# Patient Record
Sex: Female | Born: 1955 | Race: Black or African American | Hispanic: No | Marital: Married | Smoking: Never smoker
Health system: Southern US, Community
[De-identification: ages and names within clinical notes are randomized; demographics above are authoritative.]

## PROBLEM LIST (undated history)

## (undated) DIAGNOSIS — Z9289 Personal history of other medical treatment: Secondary | ICD-10-CM

## (undated) DIAGNOSIS — M549 Dorsalgia, unspecified: Secondary | ICD-10-CM

## (undated) DIAGNOSIS — D72819 Decreased white blood cell count, unspecified: Secondary | ICD-10-CM

## (undated) DIAGNOSIS — Z8619 Personal history of other infectious and parasitic diseases: Secondary | ICD-10-CM

## (undated) DIAGNOSIS — K589 Irritable bowel syndrome without diarrhea: Secondary | ICD-10-CM

## (undated) DIAGNOSIS — J301 Allergic rhinitis due to pollen: Secondary | ICD-10-CM

## (undated) DIAGNOSIS — D649 Anemia, unspecified: Secondary | ICD-10-CM

## (undated) DIAGNOSIS — I10 Essential (primary) hypertension: Secondary | ICD-10-CM

## (undated) DIAGNOSIS — D219 Benign neoplasm of connective and other soft tissue, unspecified: Secondary | ICD-10-CM

## (undated) DIAGNOSIS — Z9889 Other specified postprocedural states: Secondary | ICD-10-CM

## (undated) HISTORY — DX: Personal history of other medical treatment: Z92.89

## (undated) HISTORY — DX: Benign neoplasm of connective and other soft tissue, unspecified: D21.9

## (undated) HISTORY — DX: Allergic rhinitis due to pollen: J30.1

## (undated) HISTORY — DX: Decreased white blood cell count, unspecified: D72.819

## (undated) HISTORY — DX: Dorsalgia, unspecified: M54.9

## (undated) HISTORY — DX: Personal history of other infectious and parasitic diseases: Z86.19

## (undated) HISTORY — DX: Other specified postprocedural states: Z98.890

## (undated) HISTORY — PX: OTHER SURGICAL HISTORY: SHX169

## (undated) HISTORY — DX: Irritable bowel syndrome, unspecified: K58.9

## (undated) HISTORY — DX: Anemia, unspecified: D64.9

---

## 1975-01-15 HISTORY — PX: GALLBLADDER SURGERY: SHX652

## 1997-05-27 ENCOUNTER — Ambulatory Visit (HOSPITAL_COMMUNITY): Admission: RE | Admit: 1997-05-27 | Discharge: 1997-05-27 | Payer: Self-pay | Admitting: Obstetrics and Gynecology

## 1997-09-26 ENCOUNTER — Other Ambulatory Visit: Admission: RE | Admit: 1997-09-26 | Discharge: 1997-09-26 | Payer: Self-pay | Admitting: Obstetrics and Gynecology

## 1998-06-23 ENCOUNTER — Ambulatory Visit (HOSPITAL_COMMUNITY): Admission: RE | Admit: 1998-06-23 | Discharge: 1998-06-23 | Payer: Self-pay | Admitting: Obstetrics and Gynecology

## 1998-06-23 ENCOUNTER — Encounter: Payer: Self-pay | Admitting: Obstetrics and Gynecology

## 1998-11-15 ENCOUNTER — Other Ambulatory Visit: Admission: RE | Admit: 1998-11-15 | Discharge: 1998-11-15 | Payer: Self-pay | Admitting: Obstetrics and Gynecology

## 1999-03-07 ENCOUNTER — Other Ambulatory Visit: Admission: RE | Admit: 1999-03-07 | Discharge: 1999-03-07 | Payer: Self-pay | Admitting: Obstetrics and Gynecology

## 1999-05-10 ENCOUNTER — Encounter (INDEPENDENT_AMBULATORY_CARE_PROVIDER_SITE_OTHER): Payer: Self-pay

## 1999-05-10 ENCOUNTER — Other Ambulatory Visit: Admission: RE | Admit: 1999-05-10 | Discharge: 1999-05-10 | Payer: Self-pay | Admitting: Obstetrics and Gynecology

## 1999-08-24 ENCOUNTER — Encounter: Payer: Self-pay | Admitting: Obstetrics and Gynecology

## 1999-08-24 ENCOUNTER — Ambulatory Visit (HOSPITAL_COMMUNITY): Admission: RE | Admit: 1999-08-24 | Discharge: 1999-08-24 | Payer: Self-pay | Admitting: Obstetrics and Gynecology

## 1999-09-26 ENCOUNTER — Other Ambulatory Visit: Admission: RE | Admit: 1999-09-26 | Discharge: 1999-09-26 | Payer: Self-pay | Admitting: Obstetrics and Gynecology

## 1999-12-20 ENCOUNTER — Other Ambulatory Visit: Admission: RE | Admit: 1999-12-20 | Discharge: 1999-12-20 | Payer: Self-pay | Admitting: Obstetrics and Gynecology

## 2000-05-14 ENCOUNTER — Other Ambulatory Visit: Admission: RE | Admit: 2000-05-14 | Discharge: 2000-05-14 | Payer: Self-pay | Admitting: Obstetrics and Gynecology

## 2000-09-08 ENCOUNTER — Other Ambulatory Visit: Admission: RE | Admit: 2000-09-08 | Discharge: 2000-09-08 | Payer: Self-pay | Admitting: Obstetrics and Gynecology

## 2000-09-18 ENCOUNTER — Ambulatory Visit (HOSPITAL_COMMUNITY): Admission: RE | Admit: 2000-09-18 | Discharge: 2000-09-18 | Payer: Self-pay | Admitting: Obstetrics and Gynecology

## 2000-09-18 ENCOUNTER — Encounter: Payer: Self-pay | Admitting: Obstetrics and Gynecology

## 2000-11-28 ENCOUNTER — Encounter (INDEPENDENT_AMBULATORY_CARE_PROVIDER_SITE_OTHER): Payer: Self-pay | Admitting: Specialist

## 2000-11-28 ENCOUNTER — Ambulatory Visit (HOSPITAL_COMMUNITY): Admission: RE | Admit: 2000-11-28 | Discharge: 2000-11-28 | Payer: Self-pay | Admitting: Obstetrics and Gynecology

## 2001-01-14 HISTORY — PX: VESICOVAGINAL FISTULA CLOSURE W/ TAH: SUR271

## 2001-02-18 ENCOUNTER — Encounter (INDEPENDENT_AMBULATORY_CARE_PROVIDER_SITE_OTHER): Payer: Self-pay | Admitting: Specialist

## 2001-02-19 ENCOUNTER — Inpatient Hospital Stay (HOSPITAL_COMMUNITY): Admission: RE | Admit: 2001-02-19 | Discharge: 2001-02-21 | Payer: Self-pay | Admitting: Obstetrics and Gynecology

## 2001-05-05 ENCOUNTER — Emergency Department (HOSPITAL_COMMUNITY): Admission: EM | Admit: 2001-05-05 | Discharge: 2001-05-05 | Payer: Self-pay | Admitting: Emergency Medicine

## 2001-10-05 ENCOUNTER — Encounter: Payer: Self-pay | Admitting: Obstetrics and Gynecology

## 2001-10-05 ENCOUNTER — Ambulatory Visit (HOSPITAL_COMMUNITY): Admission: RE | Admit: 2001-10-05 | Discharge: 2001-10-05 | Payer: Self-pay | Admitting: Obstetrics and Gynecology

## 2001-11-10 ENCOUNTER — Other Ambulatory Visit: Admission: RE | Admit: 2001-11-10 | Discharge: 2001-11-10 | Payer: Self-pay | Admitting: Obstetrics and Gynecology

## 2002-10-07 ENCOUNTER — Encounter: Payer: Self-pay | Admitting: Obstetrics and Gynecology

## 2002-10-07 ENCOUNTER — Ambulatory Visit (HOSPITAL_COMMUNITY): Admission: RE | Admit: 2002-10-07 | Discharge: 2002-10-07 | Payer: Self-pay | Admitting: Obstetrics and Gynecology

## 2002-12-02 ENCOUNTER — Other Ambulatory Visit: Admission: RE | Admit: 2002-12-02 | Discharge: 2002-12-02 | Payer: Self-pay | Admitting: Obstetrics and Gynecology

## 2003-11-30 ENCOUNTER — Ambulatory Visit (HOSPITAL_COMMUNITY): Admission: RE | Admit: 2003-11-30 | Discharge: 2003-11-30 | Payer: Self-pay | Admitting: Obstetrics and Gynecology

## 2004-06-19 ENCOUNTER — Other Ambulatory Visit: Admission: RE | Admit: 2004-06-19 | Discharge: 2004-06-19 | Payer: Self-pay | Admitting: Obstetrics and Gynecology

## 2004-07-30 ENCOUNTER — Ambulatory Visit: Payer: Self-pay | Admitting: Internal Medicine

## 2004-09-21 ENCOUNTER — Ambulatory Visit: Payer: Self-pay | Admitting: Internal Medicine

## 2004-09-26 ENCOUNTER — Ambulatory Visit (HOSPITAL_COMMUNITY): Admission: RE | Admit: 2004-09-26 | Discharge: 2004-09-26 | Payer: Self-pay | Admitting: *Deleted

## 2004-09-26 ENCOUNTER — Encounter (INDEPENDENT_AMBULATORY_CARE_PROVIDER_SITE_OTHER): Payer: Self-pay | Admitting: *Deleted

## 2004-09-28 ENCOUNTER — Ambulatory Visit: Payer: Self-pay | Admitting: Internal Medicine

## 2004-10-29 ENCOUNTER — Ambulatory Visit: Payer: Self-pay | Admitting: Internal Medicine

## 2004-12-03 ENCOUNTER — Ambulatory Visit: Payer: Self-pay | Admitting: Internal Medicine

## 2004-12-05 ENCOUNTER — Ambulatory Visit: Payer: Self-pay | Admitting: Internal Medicine

## 2005-01-02 ENCOUNTER — Ambulatory Visit (HOSPITAL_COMMUNITY): Admission: RE | Admit: 2005-01-02 | Discharge: 2005-01-02 | Payer: Self-pay | Admitting: Obstetrics and Gynecology

## 2005-05-30 ENCOUNTER — Ambulatory Visit: Payer: Self-pay | Admitting: Internal Medicine

## 2005-06-07 ENCOUNTER — Ambulatory Visit: Payer: Self-pay | Admitting: Internal Medicine

## 2009-07-24 ENCOUNTER — Ambulatory Visit: Payer: Self-pay | Admitting: Internal Medicine

## 2009-07-24 DIAGNOSIS — M549 Dorsalgia, unspecified: Secondary | ICD-10-CM | POA: Insufficient documentation

## 2009-07-24 DIAGNOSIS — R7309 Other abnormal glucose: Secondary | ICD-10-CM

## 2009-08-11 ENCOUNTER — Encounter: Payer: Self-pay | Admitting: Internal Medicine

## 2009-11-06 ENCOUNTER — Ambulatory Visit: Payer: Self-pay | Admitting: Internal Medicine

## 2009-11-06 LAB — CONVERTED CEMR LAB
ALT: 18 units/L (ref 0–35)
AST: 20 units/L (ref 0–37)
Albumin: 3.7 g/dL (ref 3.5–5.2)
Alkaline Phosphatase: 80 units/L (ref 39–117)
BUN: 13 mg/dL (ref 6–23)
Basophils Absolute: 0 10*3/uL (ref 0.0–0.1)
Basophils Relative: 0.6 % (ref 0.0–3.0)
Bilirubin Urine: NEGATIVE
Bilirubin, Direct: 0.1 mg/dL (ref 0.0–0.3)
Blood in Urine, dipstick: NEGATIVE
CO2: 24 meq/L (ref 19–32)
Calcium: 9 mg/dL (ref 8.4–10.5)
Chloride: 105 meq/L (ref 96–112)
Cholesterol: 183 mg/dL (ref 0–200)
Creatinine, Ser: 0.6 mg/dL (ref 0.4–1.2)
Eosinophils Absolute: 0.1 10*3/uL (ref 0.0–0.7)
Eosinophils Relative: 2.5 % (ref 0.0–5.0)
GFR calc non Af Amer: 135.89 mL/min (ref >60)
Glucose, Bld: 100 mg/dL — ABNORMAL HIGH (ref 70–99)
Glucose, Urine, Semiquant: NEGATIVE
HCT: 39.2 % (ref 36.0–46.0)
HDL: 59.8 mg/dL (ref >39.00)
Hemoglobin: 13.4 g/dL (ref 12.0–15.0)
Hgb A1c MFr Bld: 5.7 % (ref 4.6–6.5)
Ketones, urine, test strip: NEGATIVE
LDL Cholesterol: 110 mg/dL — ABNORMAL HIGH (ref 0–99)
Lymphocytes Relative: 55.7 % — ABNORMAL HIGH (ref 12.0–46.0)
Lymphs Abs: 1.8 10*3/uL (ref 0.7–4.0)
MCHC: 34.1 g/dL (ref 30.0–36.0)
MCV: 90.8 fL (ref 78.0–100.0)
Monocytes Absolute: 0.4 10*3/uL (ref 0.1–1.0)
Monocytes Relative: 12 % (ref 3.0–12.0)
Neutro Abs: 1 10*3/uL — ABNORMAL LOW (ref 1.4–7.7)
Neutrophils Relative %: 29.2 % — ABNORMAL LOW (ref 43.0–77.0)
Nitrite: NEGATIVE
Platelets: 211 10*3/uL (ref 150.0–400.0)
Potassium: 4 meq/L (ref 3.5–5.1)
Protein, U semiquant: NEGATIVE
RBC: 4.32 M/uL (ref 3.87–5.11)
RDW: 13.2 % (ref 11.5–14.6)
Sodium: 138 meq/L (ref 135–145)
Specific Gravity, Urine: 1.02
TSH: 1.56 microintl units/mL (ref 0.35–5.50)
Total Bilirubin: 0.6 mg/dL (ref 0.3–1.2)
Total CHOL/HDL Ratio: 3
Total Protein: 6.7 g/dL (ref 6.0–8.3)
Triglycerides: 67 mg/dL (ref 0.0–149.0)
Urobilinogen, UA: 0.2
VLDL: 13.4 mg/dL (ref 0.0–40.0)
WBC Urine, dipstick: NEGATIVE
WBC: 3.3 10*3/uL — ABNORMAL LOW (ref 4.5–10.5)
pH: 6.5

## 2009-11-13 ENCOUNTER — Encounter: Payer: Self-pay | Admitting: Internal Medicine

## 2009-11-13 ENCOUNTER — Ambulatory Visit: Payer: Self-pay | Admitting: Internal Medicine

## 2009-11-13 DIAGNOSIS — D72819 Decreased white blood cell count, unspecified: Secondary | ICD-10-CM | POA: Insufficient documentation

## 2009-12-15 ENCOUNTER — Encounter: Payer: Self-pay | Admitting: Internal Medicine

## 2009-12-20 ENCOUNTER — Encounter: Payer: Self-pay | Admitting: Internal Medicine

## 2010-02-15 NOTE — Assessment & Plan Note (Signed)
Summary: cpx no pap//ccm   Vital Signs:  Patient profile:   55 year old female Menstrual status:  hysterectomy Height:      64.5 inches Weight:      157 pounds Pulse rate:   72 / minute BP sitting:   120 / 80  (left arm) Cuff size:   regular  Vitals Entered By: Romualdo Bolk, CMA (AAMA) (November 13, 2009 9:26 AM) CC: CPX- No pap- Pt has had a hyst.   History of Present Illness: Stacey Young comes in today  for preventive visit  Since last visit  here  there have been no major changes in health status  .  Back pain still an issue worse with weather . no fever  Therapy   massage  seems mechanical and  assoicated with grequent travel.  takes measures .  issues with knee since younger  exercises as can' Hasnt need to see gyne recently  had hyst for non cancer reasons  and no signs   Preventive Care Screening  Prior Values:    Mammogram:  normal (10/14/2008)    Colonoscopy:  normal (01/15/2004)   Preventive Screening-Counseling & Management  Alcohol-Tobacco     Alcohol drinks/day: 2     Alcohol type: wine     Smoking Status: never  Caffeine-Diet-Exercise     Caffeine use/day: less than 1 a day     Does Patient Exercise: yes     Type of exercise: gym, walking, zumba  Hep-HIV-STD-Contraception     Dental Visit-last 6 months no     Sun Exposure-Excessive: no  Safety-Violence-Falls     Seat Belt Use: yes     Firearms in the Home: no firearms in the home     Smoke Detectors: no     Fall Risk: no  Current Medications (verified): 1)  None  Allergies (verified): No Known Drug Allergies  Past History:  Past medical, surgical, family and social histories (including risk factors) reviewed, and no changes noted (except as noted below).  Past Medical History: Hay fever/allergies Uterine fibroids Anemia IBS Decreased WBC G2P2 Colonscopy UTD  Varicella as a child  back pain  Past Surgical History: Reviewed history from 07/24/2009 and no changes  required. Caesarean section-1987 Gallbladder-1977 Hysterectomy  2003  for fibroids and endometriosis   Past History:  Care Management: Gynecology: [facility]- In the Past  Contraindications/Deferment of Procedures/Staging:    Test/Procedure: PAP Smear    Reason for deferment: hysterectomy   Family History: Reviewed history from 07/24/2009 and no changes required. Father: Bladder Cancer, ? high cholesterol, both hips replace Mother: Back issues, ringing in ears, arthritis, low WBC, knee replaced Siblings: Sister- Low WBC, high cholesterol, migraines  Social History: Reviewed history from 07/24/2009 and no changes required. Never Smoked Alcohol use-yes Regular exercise-yes Drug use-no History of drug use HH of 2   Pet dog. 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  Dental Care w/in 6 mos.:  no Fall Risk:  no  Review of Systems       ocass knee give out since HS  . has shallow knee cap.   otherwise  system review 12 is negative or [place]   Physical Exam  General:  Well-developed,well-nourished,in no acute distress; alert,appropriate and cooperative throughout examination Head:  normocephalic, atraumatic, and no abnormalities observed.   Eyes:  PERRL, EOMs full, conjunctiva clear  Ears:  R ear normal, L ear normal, and no external deformities.   Nose:  no external deformity, no external erythema, and no nasal discharge.   Mouth:  good dentition and pharynx pink and moist.   Neck:  No deformities, masses, or tenderness noted. Breasts:  No mass, nodules, thickening, tenderness, bulging, retraction, inflamation, nipple discharge or skin changes noted.   Lungs:  Normal respiratory effort, chest expands symmetrically. Lungs are clear to auscultation, no crackles or wheezes.no dullness.   Heart:  Normal rate and regular rhythm. S1 and S2 normal without gallop, murmur, click, rub or other extra sounds. Abdomen:  Bowel sounds positive,abdomen soft and  non-tender without masses, organomegaly or   noted.  abd  scar well healed no hernia noted  Msk:  no joint swelling, no joint warmth, no redness over joints, and no joint deformities.   good flexibility Pulses:  pulses intact without delay   Extremities:  no clubbing cyanosis or edema  Neurologic:  alert & oriented X3, strength normal in all extremities, gait normal, and DTRs symmetrical and normal.   Pt is A&Ox3,affect,speech,memory,attention,&motor skills appear intact.  Skin:  turgor normal, color normal, no ecchymoses, no petechiae, and no purpura.   Cervical Nodes:  No lymphadenopathy noted Axillary Nodes:  No palpable lymphadenopathy Inguinal Nodes:  No significant adenopathy Psych:  Normal eye contact, appropriate affect. Cognition appears normal.  Labs reviewed   Impression & Recommendations:  Problem # 1:  PREVENTIVE HEALTH CARE (ICD-V70.0) Discussed nutrition,exercise,diet,healthy weight, vitamin D and calcium. no fam hx of osteoprosis  continue helathy lifestyle   pap not indicated today   .  Orders: EKG w/ Interpretation (93000)  Problem # 2:  BACK PAIN (ICD-724.5) mechanical   disc  prevention exercises etc.   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 get yearly check or as needed    Problem # 4:  HYPERGLYCEMIA (ICD-790.29) Assessment: Improved lifestyle intervention   hg a1c is 5.7   Other Orders: Tdap => 90yrs IM (91478) Admin 1st Vaccine (29562)  Patient Instructions: 1)  continue healthy lifestyle  2)  ensure adequate  calcium and vit d in your diet or supplement.  3)  yearly checks. 4)  quad and hamstring hip adductor exercises may  help knee and also back    Orders Added: 1)  Tdap => 1yrs IM [90715] 2)  Admin 1st Vaccine [90471] 3)  Est. Patient 40-64 years [99396] 4)  EKG w/ Interpretation [93000]   Immunizations Administered:  Tetanus Vaccine:    Vaccine Type: Tdap    Site: right  deltoid    Mfr: GlaxoSmithKline    Dose: 0.5 ml    Route: IM    Given by: Romualdo Bolk, CMA (AAMA)    Exp. Date: 11/03/2011    Lot #: [id number]   Immunizations Administered:  Tetanus Vaccine:    Vaccine Type: Tdap    Site: right deltoid    Mfr: GlaxoSmithKline    Dose: 0.5 ml    Route: IM    Given by: Romualdo Bolk, CMA (AAMA)    Exp. Date: 11/03/2011    Lot #: [id number]  Preventive Care Screening  Prior Values:    Mammogram:  normal (10/14/2008)    Colonoscopy:  normal (01/15/2004)

## 2010-02-15 NOTE — Letter (Signed)
Summary: External Other  External Other   Imported By: Everrett Coombe 08/11/2009 09:02:37  _____________________________________________________________________  External Attachment:    Type:   Image     Comment:   External Document

## 2010-02-15 NOTE — Assessment & Plan Note (Signed)
Summary: PT RE-EST // RS   Vital Signs:  Patient profile:   55 year old female Menstrual status:  hysterectomy Height:      64.5 inches Weight:      159 pounds BMI:     26.97 Pulse rate:   72 / minute BP sitting:   120 / 80  (right arm) Cuff size:   regular  Vitals Entered By: Romualdo Bolk, CMA (AAMA) (July 24, 2009 9:31 AM) CC: New Pt to get re-establish     Menstrual Status hysterectomy   History of Present Illness: Stacey Young comes in today  for new patient visit to get reestablished.  [place]  [place] from [place].   She has had a recent Back issues with spasm  .   THinks it is MS cause but no injury perse.  She has had elevated BG in the past . was tole her sugars  are   "pre diabetes"     x 2 and then better.   with lifestyle intervention  and continues to exercise .  Last labs   within a year.    in [place] where she previously lived.  On no meds  . lifestyle intervention for health prevention and glucose control     Preventive Care Screening  Mammogram:    Date:  10/14/2008    Results:  normal   Colonoscopy:    Date:  01/15/2004    Results:  normal    Preventive Screening-Counseling & Management  Alcohol-Tobacco     Alcohol drinks/day: 2     Alcohol type: wine     Smoking Status: never  Caffeine-Diet-Exercise     Caffeine use/day: less than 1 a day     Does Patient Exercise: yes     Type of exercise: gym, walking, zumba  Comments: husbands smoke   Hep-HIV-STD-Contraception     Sun Exposure-Excessive: no  Safety-Violence-Falls     Seat Belt Use: yes     Smoke Detectors: no      Drug Use:  no.        Blood Transfusions:  ? transfusion with hyst.?.    Current Medications (verified): 1)  None  Allergies (verified): No Known Drug Allergies  Past History:  Past Medical History: Hay fever/allergies Uterine fibroids Anemia IBS Decreased WBC G2P2 Colonscopy UTD  Varicella as a child   Past Surgical History: Caesarean  section-1987 Gallbladder-1977 Hysterectomy  2003  for fibroids and endometriosis   Past History:  Care Management: Gynecology: [facility]- In the Past  Family History: Father: Bladder Cancer, ? high cholesterol, both hips replace Mother: Back issues, ringing in ears, arthritis, low WBC, knee replaced Siblings: Sister- Low WBC, high cholesterol, migraines  Social History: Never Smoked Alcohol use-yes Regular exercise-yes Drug use-no History of drug use HH of 2   Pet dog. 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 transfusion with hyst.?  Review of Systems  The patient denies anorexia, fever, weight loss, weight gain, vision loss, hoarseness, chest pain, syncope, dyspnea on exertion, peripheral edema, prolonged cough, abdominal pain, melena, hematochezia, severe indigestion/heartburn, hematuria, difficulty walking, abnormal bleeding, enlarged lymph nodes, angioedema, and breast masses.    Physical Exam  General:  alert, well-developed, well-nourished, and well-hydrated.   Head:  normocephalic and atraumatic.  Eyes:  vision grossly intact, pupils equal, and pupils round.   Neck:  No deformities, masses, or tenderness noted. Lungs:  Normal respiratory effort, chest expands symmetrically. Lungs are clear to auscultation, no crackles or wheezes.no dullness.   Heart:  Normal rate and regular rhythm. S1 and S2 normal without gallop, murmur, click, rub or other extra sounds.no lifts.   Abdomen:  Bowel sounds positive,abdomen soft and non-tender without masses, organomegaly or   noted. Msk:  no joint swelling, no joint warmth, and no redness over joints.  no bony tendernes back  some paralumbar tenderness nl gait  Pulses:  pulses intact without delay   Extremities:  no clubbing cyanosis or edema    Neurologic:  alert & oriented X3 and gait normal.  grossly non focal  Skin:  turgor normal, color normal, no ecchymoses, and no petechiae.   Cervical Nodes:  No lymphadenopathy noted Psych:  Oriented X3, normally interactive, good eye contact, not anxious appearing, and not depressed appearing.     Impression & Recommendations:  Problem # 1:  HYPERGLYCEMIA (ICD-790.29) lifestyle intervention and needs follow up .     counseled .   no end organ signs .   "pre diabetes"   Problem # 2:  BACK PAIN (ICD-724.5) agree  MS  an no alarm features   . Expectant management   Patient Instructions: 1)  cpx labs  and Hg a1c  in   the fall with CPX  then .  2)  dx v70.0 and Hyperglycemia.  3)  call in meantime if needed.

## 2010-02-15 NOTE — Procedures (Signed)
Summary: Colonoscopy Report/[facility]   Colonoscopy Report/[facility]   Imported By: Maryln Gottron 12/27/2009 15:12:09  _____________________________________________________________________  External Attachment:    Type:   Image     Comment:   External Document

## 2010-02-15 NOTE — Miscellaneous (Signed)
Summary: Colonscopy  Clinical Lists Changes  Observations: Added new observation of COLONOSCOPY: Non bleeding, small, internal hemorrhoids The examined portion of the ileum was normal.  Done By Dr. Cipriano Mile: Normal. Location:  [facility].    (12/15/2009 13:21)        Colonoscopy  Procedure date:  12/15/2009  Findings:      Non bleeding, small, internal hemorrhoids The examined portion of the ileum was normal.  Done By Dr. Cipriano Mile: Normal. Location:  [facility].

## 2010-06-01 NOTE — Discharge Summary (Signed)
[facility]  Patient:    Stacey Young, Stacey Young Visit Number: [id number] MRN: [id number]          Service Type: GYN Location: 9300 [id number] Attending Physician:  Shaune Spittle Dictated by:   Gillermo Murdoch, P.A. Admit Date:  02/18/2001 Discharge Date: 02/21/2001                             Discharge Summary  DISCHARGE DIAGNOSES:          1. Fibroid uterus.                               2. Dysmenorrhea.                               3. CIN-1.                               4. Anemia.  PROCEDURES:                   On February 18, 2001: A vaginal hysterectomy was attempted, however, due to extensive adhesions, the patient underwent a total abdominal hysterectomy with extensive lysis of those adhesions tolerating the procedures well.  HISTORY OF PRESENT ILLNESS:   Stacey Young is a 55 year old married African-American female, para 2-0-0-2, with a long-standing history of symptomatic uterine fibroids and abnormal Pap smears who presents for definitive therapy of both in the form of hysterectomy. Please see patients dictated history and physical exam for details.  PHYSICAL EXAMINATION:  VITAL SIGNS:                  Blood pressure 90/60. Weight is 165. Height is 5 feet 4-1/2 inches tall.  GENERAL:                      Within normal limits.  PELVIC:                       EGBUS was within normal limits. Vagina is rugose. Cervix is nontender without lesions. Uterus appears 18 to 20 weeks size, irregular, without tenderness. Adnexa without tenderness or masses. Rectovaginal without tenderness or masses.  HOSPITAL COURSE:              On February 5, the patient underwent a total abdominal hysterectomy with extensive lysis of adhesions tolerating the procedures well. Within the pelvis was found a uterus of approximately 12 to 14 weeks size, ovaries which were adherent bilaterally, extensive pelvic adhesions and peritoneal endometriosis.  Postoperative course was marked by slow resumption of bowel function. However, by postoperative day #3, the patient was able to tolerate a regular diet and deemed ready for discharge home. Postoperative hemoglobin was 7.3 (preoperative hemoglobin 11.3).  DISCHARGE MEDICATIONS:        1. Tylox one to two tablets every four to six                                  hours as needed for pain.  2. Ibuprofen 600 mg one tablet with food every                                  six hours for five days then as needed for                                  pain.                               3. Iron 325 mg one tablet twice daily for six                                  weeks.                               4. Stool softeners 100 mg one tablet three times                                  daily until bowel movements are regular.                               5. Phenergan 12.5 mg one tablet four times daily                                  as needed for nausea.  DISCHARGE FOLLOWUP:           The patient is to see Dr. Dierdre Forth for her six weeks postoperative exam on April 13, 2001 at 3 p.m. at [facility].  POSTOPERATIVE INSTRUCTIONS:   The patient was given of [facility] postoperative instruction sheet. She was further advised to avoid driving for two weeks, heavy lifting for four weeks, and intercourse for six weeks.  FINAL PATHOLOGY:              Uterus: Endometrium: weakly proliferative, no hyperplasia or malignancy identified. Leiomyomatas submucosal, intramural and subserosal. Benign uterine serosa. No endometriosis or malignancy identified Cervix: Focal slight squamous dysplasia, CIN-1. No evidence of high grade dysplasia and margins not involved. Dictated by:   Gillermo Murdoch, P.A. Attending Physician:  Shaune Spittle DD:  03/18/01 TD:  03/20/01 Job: [id number]

## 2010-06-01 NOTE — Op Note (Signed)
[facility]  Patient:    Stacey Young, Stacey Young Visit Number: [id number] MRN: [id number]          Service Type: GYN Location: 9300 [id number] Attending Physician:  Shaune Spittle Dictated by:   Maris Berger Pennie Rushing, M.D. Proc. Date: 02/18/01 Admit Date:  02/18/2001                             Operative Report  PREOPERATIVE DIAGNOSES:       1. Uterine fibroids.                               2. Dysmenorrhea.                               3. Cervical intraepithelial neoplasia-one of the                                  cervix.  POSTOPERATIVE DIAGNOSES:      1. Uterine fibroids.                               2. Dysmenorrhea.                               3. Cervical intraepithelial neoplasia-one of the                                  cervix.                               4. Extensive endometriosis.  OPERATION:                    1. Attempted vaginal hysterectomy.                               2. Total abdominal hysterectomy.                               3. Extensive lysis of adhesions, secondary                                  endometriosis.                               4. Ureterolysis.  SURGEON:                      Vanessa P. Pennie Rushing, M.D.  FIRST ASSISTANT:              Henreitta Leber, P.A.-C. and Janine Limbo,                               M.D.  ANESTHESIA:  General orotracheal.  ESTIMATED BLOOD LOSS:         1100 cc.  FLUIDS:                       3200 cc of crystalloid.  URINE:                        360 cc.  FINDINGS:                     The uterus was enlarged to 12-14 weeks size with multiple uterine fibroids.  The ovaries were densely adherent to the posterior uterus as were the tubes.  The rectovaginal septum was obliterated with extensive adhesive disease.  DESCRIPTION OF PROCEDURE:     The patient was taken to the operating room after appropriate identification and placed on the operating table.   After the obtainment of adequate general anesthesia, she was placed in the lithotomy position.  The perineum and vagina were prepped with multiple layers of Betadine and draped as a sterile field.  A Foley catheter was inserted into the bladder and connected to straight drainage.  A weighted speculum was placed in the posterior vagina, and Lahey tenaculae placed on the anterior and posterior surfaces of the cervix.  The cervicovaginal mucosa was injected with a dilute solution of Pitressin and circumscribed.  The anterior vaginal mucosa was dissected off the anterior cervix.  However, the anterior peritoneum could not be entered.  The posterior vaginal mucosa was then incised.  However, no entry into the posterior cul-de-sac could be achieved, and it was apparent that the rectum was densely adherent to the posterior uterus.  At this time, a decision was made to proceed with abdominal hysterectomy.  All instruments were removed from the operative field and the patient placed in the supine position.  The abdomen was prepped with multiple layers of Betadine and draped in the sterile field.  A transverse incision was made at the site of a previous cesarean section, and the incision in the abdomen opened in layers.  The peritoneum was entered.  The above noted findings were noted.  A self-retaining OConnor-OSullivan retractor was placed and the bowel packed cephalad.  The right round ligament was identified, suture ligated, and incised, and that incision taken anteriorly on the anterior leaf of the broad ligament, and posteriorly towards the tube and utero-ovarian ligaments.  The fallopian tube was then clamped, cut, and suture ligated at the level of the uterine cornua, and the utero-ovarian ligament identified, clamped, cut, and suture ligated.  The retroperitoneal space was further entered and isolated, and with a combination of blunt and sharp dissection, the right ovary dissected off the  posterior uterus.  At that time, a right fundal fibroid was noted to be in the way of any further identification of anatomy, and a myomectomy was performed with a combination of blunt and sharp dissection to remove that right anterior fundal fibroid.  At that time, the ureter on the right side was isolated at the level of the infundibulopelvic ligament and followed down.  Further blunt and sharp dissection allowed isolation of the right uterine artery.  The peritoneum had been incised toward the bladder peritoneum, and the bladder was sharply dissected off the anterior cervix to meet the area that had been dissected during the vaginal hysterectomy.  At that time, the right uterine artery was skeletonized, and noted to be away from the right ureter which  had been identified and followed down.  It was then clamped, cut, and suture ligated with a suture of 0 Vicryl.  The left round ligament was then identified, isolated, clamped, and suture ligated, then incised, and the anterior peritoneum taken toward the bladder peritoneum to meet the other side.  The left ovary and sigmoid colon were densely adherent to the posterior uterus, and a combination of blunt and sharp dissection allowed resection of these structures off the posterior uterus. Again, the left fallopian tube was clamped, cut, and suture ligated at the level of the cornual region to allow access to the utero-ovarian ligament which was then clamped, cut, and suture ligated.  Further dissection on that side allowed isolation of the left uterine artery which was clamped, cut, and suture ligated.  At this point, the uterine fundus was excised from the cervix, and removed from the operative field.  A combination of blunt and sharp dissection allowed identification of the left ureter and the retroperitoneal space, and it was noted to be far away from the field of dissection on the left side as well. The paracervical tissues were then  clamped, cut, and suture ligated.  The  vagina was entered anteriorly at the site of dissection from the vaginal hysterectomy, and the cervix inverted to allow access from the vaginal mucosa to the vaginal angle which was clamped, cut, and suture ligated on either side with those sutures held.  The uterosacral ligaments were then accessed, clamped, cut, and suture ligated, and with further inversion of the cervix, the rectovaginal septum dissected off the posterior cervix to allow removal of the entire cervix.  Uterosacral ligament sutures were reinforced, and a Richardson stitch of the vaginal angle undertaken.  The remainder of the vaginal cuff was then closed with figure-of-eight sutures, all of 0 Vicryl, and hemostasis noted to be adequate.  The uterosacral ligament and vaginal angles on each side were tied together and then tied in the center.  Hemostasis was achieved in the right retroperitoneal space with clips and in the left adnexa with clips.  Copious irrigation was carried out and hemostasis noted to be adequate.  All instruments were then removed from the peritoneal cavity and the abdominal peritoneum closed with a running suture of 2-0 Vicryl.  The defects and the fascia were closed with interrupted sutures of 0 Vicryl, and then the fascial incision closed with a running suture of 0 Vicryl from each apex to the midline and then tied in the midline.  The subcutaneous tissue was copiously irrigated and made hemostatic with Bovie cautery and skin staples applied to the skin incision.  A sterile dressing was applied.  The patient was awakened from general anesthesia and taken to the recovery room in satisfactory condition having tolerated the procedure well.  The sponge and instrument counts correct.  Specimens to pathology - uterus and cervix.  Total operating time was 3 hours and 55 minutes. Dictated by:   Maris Berger. Pennie Rushing, M.D. Attending Physician:  Shaune Spittle DD:  02/18/01 TD:  02/19/01 Job: [id number]

## 2010-06-01 NOTE — H&P (Signed)
[facility]  Patient:    Stacey Young, Stacey Young Visit Number: [id number] MRN: [id number]          Service Type: DSU Location: [facility] Attending Physician:  Shaune Spittle Dictated by:   Henreitta Leber, P.A. Admit Date:  11/28/2000 Discharge Date: 11/28/2000                           History and Physical  DATE OF BIRTH:                02/05/1955.  HISTORY OF PRESENT ILLNESS:   Stacey Young is a 55 year old, married, African-American female, para 2-0-0-2, who presents with a longstanding history of symptomatic uterine fibroids and abnormal Pap smears, who presents for definitive treatment of both.  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  For the past three years, the patient has had problems with anemia due to menometrorrhagia and severe dysmenorrhea, both of which initially responded well to Loestrin 1.5/30.  More recently, however, in spite of continuing this regimen, the patient continues to have a menstrual flow which lasts longer than usual and is accompanied by clotting.  The patient underwent an endometrial biopsy in October 2002, which was within normal limits.  After review of her options, to include expected management, the patient has decided to undergo hysterectomy as a definitive treatment of the aforementioned concerns.  PAST MEDICAL AND OBSTETRICAL HISTORY:          Gravida 2, para 2-0-0-2; patient underwent one cesarean section.  GYNECOLOGICAL HISTORY:        Menarche at 55 years old; periods were regular up until three years ago (see history of present illness).  The patient uses Loestrin 1.5/30 for contraception; she has a  history of Trichomonas, HSV-II, and HPV; history of ovarian cysts, uterine fibroids, abnormal Pap smears (see history of present illness); the patient had a normal Pap smear in August 2002 and a normal mammogram September 2002.  MEDICAL HISTORY:              Positive for anemia and irritable bowel syndrome.  SURGICAL HISTORY:             S/P cholecystectomy and appendectomy 1977, dental surgery, cesarean section 1987, cervical cryosurgery 2001, and cold knife conization November 2002.  Denies blood transfusions or difficulties with anesthesia.  FAMILY HISTORY:               Positive for cancer, hypertension, stroke, diabetes, and hypercholesterolemia.  SOCIAL HISTORY:               The patient is married and she is employed with the [employer] for [employer]  HABITS:                       The patient does not use tobacco and she rarely consumes alcohol.  CURRENT MEDICATIONS:          1.  Loestrin 1.5/30.                               2.  Chondroitin/glucosamine.  3.  Tums.                               4.  Aleve p.r.n.  ALLERGIES:                    None.  REVIEW OF SYSTEMS:            The patient has occasional lightheadedness.  She wears glasses, has history of chronic sinusitis and chronic knee/right hip pain.  PHYSICAL EXAMINATION:  VITAL SIGNS:                  Blood pressure 90/60, weight 165, height 5 feet 4-1/2 inches tall.  EAR/NOSE/THROAT:              Within normal limits.  NECK:                         Supple, without masses.  There is no thyromegaly.  LUNGS:                        Clear to auscultation and percussion.  There are no heaves, rales, or rhonchi.  HEART:                        Regular rate and rhythm.  There is no murmur.  ABDOMEN:                      Bowel sounds are present.  It is soft and nontender.  BACK:                         Without CVA tenderness.  EXTREMITIES:                  No clubbing,  cyanosis, or edema.  PELVIC EXAM:                  EG/BUS within normal limits.  Vagina is rugose. Cervix is nontender, without lesions.  Uterus is 18-20 weeks size, irregular, without tenderness.  Adnexa without tenderness or masses.  RECTOVAGINAL:                 Without tenderness or masses.  IMPRESSIONS:                  1.  Symptomatic uterine fibroids.                               2.  Irregular bleeding.                               3.  CIN-I, with positive endocervical margins.  DISPOSITION:                  After considering medical and surgical options for presenting complaints, the patient has consented to undergo hysterectomy. She understands the implications for her procedure, along with the potential risks to include, but are not limited to, reaction to anesthesia, damage to adjacent organs, infection, and bleeding.  Patient is scheduled to undergo hysterectomy at [facility] on February 18, 2001, at 7:30 a.m. Dictated by:   Henreitta Leber, P.A. Attending Physician:  Shaune Spittle DD:  01/29/01 TD:  01/29/01 Job: [id number]

## 2010-06-01 NOTE — Op Note (Signed)
[facility]  Patient:    Stacey Young, Stacey Young Visit Number: [id number] MRN: [id number]          Service Type: DSU Location: [facility] Attending Physician:  Shaune Spittle Dictated by:   Maris Berger. Pennie Rushing, M.D. Proc. Date: 11/28/00 Admit Date:  11/28/2000                             Operative Report  PREOPERATIVE DIAGNOSIS:       Recurrent low grade squamous intraepithelial lesion with a positive endocervical curetting.  POSTOPERATIVE DIAGNOSIS:      Recurrent low grade squamous intraepithelial lesion with a positive endocervical curetting.  OPERATIONS:                   1. Cold knife conization of the cervix.                               2. Endocervical curetting.  SURGEON:                      Vanessa P. Pennie Rushing, M.D.  ANESTHESIA:                   General LMA.  ESTIMATED BLOOD LOSS:         Less than 50 cc.  COMPLICATIONS:                None.  FINDINGS:                     There were no exocervical lesions noted at the time of colposcopy.  DESCRIPTION OF PROCEDURE:     The patient was taken to the operating room and placed on the operating table.  After the attainment of adequate general anesthesia, she was placed in the lithotomy position.  The perineum and vagina were prepped with multiple layers of Betadine and draped as a sterile field. A weighted speculum was placed in the vagina and a Deaver used to elevate the bladder.  A single-tooth tenaculum was placed on the anterior cervix outside the transition zone.  The cervix was injected with a dilute solution of Pitressin and stay sutures placed at the 3 and 9 oclock positions and tied down.  A cone-shaped portion of cervix including the endocervix but not any significant portion of the exocervix was then excised and the endocervical canal which remained curetted sharply.  The conization specimen was marked with a suture at 12 oclock and sent fresh.  Sturm-Dorf sutures were placed  at the 6 and 12 oclock positions and hemostasis noted to be adequate.  A piece of Gelfoam was placed in the conization bed.  All instruments were removed from the vagina and the patient awakened from general anesthesia and taken to the recovery room in satisfactory condition, having tolerated the procedure well, with sponge and instrument counts correct. Dictated by:   Maris Berger. Pennie Rushing, M.D. Attending Physician:  Shaune Spittle DD:  11/28/00 TD:  11/28/00 Job: [id number]

## 2010-09-21 ENCOUNTER — Ambulatory Visit (INDEPENDENT_AMBULATORY_CARE_PROVIDER_SITE_OTHER): Payer: 59 | Admitting: Internal Medicine

## 2010-09-21 ENCOUNTER — Encounter: Payer: Self-pay | Admitting: Internal Medicine

## 2010-09-21 DIAGNOSIS — W57XXXA Bitten or stung by nonvenomous insect and other nonvenomous arthropods, initial encounter: Secondary | ICD-10-CM

## 2010-09-21 DIAGNOSIS — M79672 Pain in left foot: Secondary | ICD-10-CM

## 2010-09-21 DIAGNOSIS — M79609 Pain in unspecified limb: Secondary | ICD-10-CM

## 2010-09-21 MED ORDER — DOXYCYCLINE HYCLATE 100 MG PO CAPS: 100.0000 mg | ORAL_CAPSULE | Freq: Two times a day (BID) | ORAL | Status: DC

## 2010-09-21 NOTE — Progress Notes (Signed)
  Subjective:    Patient ID: Stacey Young, female    DOB: 1955-02-18, 55 y.o.   MRN: [id number]  HPI The patient comes in for acute issues and advice before travel. To go to [place] for 6 weeks working. Had multiple immunizations recently  No other se.  last week was outside and had a number of bug bites( flying)  And had itchy spot left neck felt to be a bug bite  However everything got better but redness  Became larger  Over the week and keeps itching .  No rx  no fever systemic sx other wise .  No tick bites.  Also has left foot pain off and on for a while but increase over past 2 weeks without specific injury. Does reg walking and no change shoes and no swelling or bruising.  Hurts on top of foot whe pushes off and walks.  Not severe but want opinion before travel.  sReview of Systems NO fever cp swollen joints  Bleeding bruising    Objective:   Physical Exam WDWn in [place]  Neck left neck  With a 2-3 cm round central punctate lesion  With flat redness and itchy no bullseye and no abscess or streaking  Left foot : nl by inspection localized tenderness left lateral foot below ankle  Pain worse with active plantar flexion not with passive  And no redness or swelling.  Pulses are normal. Neurovascular intact.     Assessment & Plan:  Insect bite and large reaction  1 week out .   'disc risk sx and can start antibioitc if needed but proably is a  Bug bite reaction. rx for doxy incase needed. Counseled. About use  Foot pain   Left  Acts like overuse  May sugg x ray or sports med if not better .   acitve rom hurts and not passive. Counseled.   None of this seems related to her immuniz for travel.

## 2010-09-21 NOTE — Patient Instructions (Signed)
Still looks like a large local reaction to insect bite.  Try cool compresses  And hydrocortisone  Topical.  If get more swollen and painful infected looking add antibiotic . In regard to foot pain is in area of foot tendinitis or overuse strain However   if  persistent or progressive can see sports med or ortho in the future.

## 2010-09-22 ENCOUNTER — Encounter: Payer: Self-pay | Admitting: Internal Medicine

## 2010-09-22 DIAGNOSIS — M79672 Pain in left foot: Secondary | ICD-10-CM | POA: Insufficient documentation

## 2010-11-26 ENCOUNTER — Telehealth: Payer: Self-pay | Admitting: Internal Medicine

## 2010-11-26 NOTE — Telephone Encounter (Signed)
Pt requested you contact her regarding a cpx. Will be avilable after 4:00

## 2010-11-26 NOTE — Telephone Encounter (Signed)
Left message to call back  

## 2010-11-27 NOTE — Telephone Encounter (Signed)
Pt needs to be worked in for a cpx before then end of the year. Where can we work her in at?

## 2010-11-28 NOTE — Telephone Encounter (Signed)
Per Dr. Fabian Sharp- can do 12/6 at 10:45 and 11am for her and her husband.

## 2010-11-28 NOTE — Telephone Encounter (Signed)
appts made.

## 2010-11-29 ENCOUNTER — Other Ambulatory Visit (INDEPENDENT_AMBULATORY_CARE_PROVIDER_SITE_OTHER): Payer: 59

## 2010-11-29 DIAGNOSIS — Z0000 Encounter for general adult medical examination without abnormal findings: Secondary | ICD-10-CM

## 2010-11-29 LAB — CBC WITH DIFFERENTIAL/PLATELET
Basophils Absolute: 0 10*3/uL (ref 0.0–0.1)
Basophils Relative: 0.3 % (ref 0.0–3.0)
Eosinophils Absolute: 0.1 10*3/uL (ref 0.0–0.7)
HCT: 38.8 % (ref 36.0–46.0)
Hemoglobin: 13.2 g/dL (ref 12.0–15.0)
Lymphs Abs: 2 10*3/uL (ref 0.7–4.0)
MCHC: 34.1 g/dL (ref 30.0–36.0)
Neutro Abs: 1.1 10*3/uL — ABNORMAL LOW (ref 1.4–7.7)
RBC: 4.32 Mil/uL (ref 3.87–5.11)
RDW: 13.5 % (ref 11.5–14.6)

## 2010-11-29 LAB — POCT URINALYSIS DIPSTICK
Glucose, UA: NEGATIVE
Leukocytes, UA: NEGATIVE
Protein, UA: NEGATIVE
Spec Grav, UA: 1.025
Urobilinogen, UA: 0.2

## 2010-11-30 LAB — BASIC METABOLIC PANEL
CO2: 27 mEq/L (ref 19–32)
Chloride: 108 mEq/L (ref 96–112)
Glucose, Bld: 95 mg/dL (ref 70–99)
Potassium: 3.8 mEq/L (ref 3.5–5.1)
Sodium: 141 mEq/L (ref 135–145)

## 2010-11-30 LAB — HEPATIC FUNCTION PANEL
ALT: 23 U/L (ref 0–35)
AST: 24 U/L (ref 0–37)
Albumin: 3.9 g/dL (ref 3.5–5.2)
Alkaline Phosphatase: 77 U/L (ref 39–117)
Total Bilirubin: 0.6 mg/dL (ref 0.3–1.2)

## 2010-11-30 LAB — LIPID PANEL
Cholesterol: 182 mg/dL (ref 0–200)
LDL Cholesterol: 103 mg/dL — ABNORMAL HIGH (ref 0–99)

## 2010-11-30 LAB — TSH: TSH: 1.64 u[IU]/mL (ref 0.35–5.50)

## 2010-12-03 ENCOUNTER — Encounter: Payer: Self-pay | Admitting: Internal Medicine

## 2010-12-03 ENCOUNTER — Ambulatory Visit (INDEPENDENT_AMBULATORY_CARE_PROVIDER_SITE_OTHER): Payer: 59 | Admitting: Internal Medicine

## 2010-12-03 VITALS — BP 110/70 | HR 60 | Ht 64.0 in | Wt 159.0 lb

## 2010-12-03 DIAGNOSIS — Z23 Encounter for immunization: Secondary | ICD-10-CM

## 2010-12-03 DIAGNOSIS — D72819 Decreased white blood cell count, unspecified: Secondary | ICD-10-CM

## 2010-12-03 DIAGNOSIS — G479 Sleep disorder, unspecified: Secondary | ICD-10-CM | POA: Insufficient documentation

## 2010-12-03 DIAGNOSIS — M25569 Pain in unspecified knee: Secondary | ICD-10-CM

## 2010-12-03 DIAGNOSIS — Z0000 Encounter for general adult medical examination without abnormal findings: Secondary | ICD-10-CM

## 2010-12-03 MED ORDER — ZOLPIDEM TARTRATE 10 MG PO TABS: 10.0000 mg | ORAL_TABLET | Freq: Every evening | ORAL | Status: DC | PRN

## 2010-12-03 NOTE — Patient Instructions (Signed)
Continue lifestyle intervention healthy eating and exercise .  Recheck one year or prn.

## 2010-12-03 NOTE — Progress Notes (Signed)
Subjective:    Patient ID: Stacey Young, female    DOB: 07/18/1955, 55 y.o.   MRN: [id number]  HPI Patient comes in today for Preventive Health Care visit  No major change in health status since last visit .  Sleep: an issues  When travels. Most flight now are over night to [place] and [place]. Can be hard. to sleep. Ask about sleep aid  otcs give too much hangover.  No chronic sleep problem otherwise  No ed visit major injury . Knee still problematic but exercises when can  Review of Systems ROS:  GEN/ HEENTNo fever, significant weight changes sweats headaches vision problems hearing changes, CV/ PULM; No chest pain shortness of breath cough, syncope,edema  change in exercise tolerance. GI /GU: No adominal pain, vomiting, change in bowel habits. No blood in the stool. No significant GU symptoms. SKIN/HEME: ,no acute skin rashes suspicious lesions or bleeding. No lymphadenopathy, nodules, masses.  NEURO/ PSYCH:  No neurologic signs such as weakness numbness No depression anxiety. IMM/ Allergy: No unusual infections.  Allergy .   REST of 12 system review negative  Knees bother her at times as above. Right  Has hx of evaluation and exercise s not done recently  Medial quad tenderness at times  Past Medical History  Diagnosis Date  . Hay fever   . Fibroids     uterine  . Anemia   . IBS (irritable bowel syndrome)   . WBC decreased   . Hx of colonoscopy     utd  . Hx of varicella     as a child  . Back pain     History   Social History  . Marital Status: Single    Spouse Name: N/A    Number of Children: N/A  . Years of Education: N/A   Occupational History  . Not on file.   Social History Main Topics  . Smoking status: Never Smoker   . Smokeless tobacco: Not on file  . Alcohol Use: Yes  . Drug Use: No  . Sexually Active: Not on file   Other Topics Concern  . Not on file   Social History Narrative   Never SmokedAlcohol use-yesRegular Exercise-yesDrug use-  nohh of 2 Pet dogMarried education PhDExecutive education cclTravels a lot    Past Surgical History  Procedure Date  . Bladder repair w/ cesarean section 1987  . Gallbladder surgery 1977  . Vesicovaginal fistula closure w/ tah 2003    for fibroids and endometriosis    Family History  Problem Relation Age of Onset  . Arthritis Mother   . Cancer Father     bladder  . Hyperlipidemia Father   . Migraines Sister   . Hyperlipidemia Sister     low wbc    No Known Allergies  No current outpatient prescriptions on file prior to visit.    BP 110/70  Pulse 60  Ht 5\' 4"  (1.626 m)  Wt 159 lb (72.122 kg)  BMI 27.29 kg/m2       Objective:   Physical Exam Physical Exam: Vital signs reviewed NWG:NFAO is a well-developed well-nourished alert cooperative  aa female who appears her stated age in no acute distress.  HEENT: normocephalic  traumatic , Eyes: PERRL EOM's full, conjunctiva clear, Nares: paten,t no deformity discharge or tenderness., Ears: no deformity EAC's clear TMs with normal landmarks. Mouth: clear OP, no lesions, edema.  Moist mucous membranes. Dentition in adequate repair. NECK: supple without masses, thyromegaly or bruits. CHEST/PULM:  Clear to auscultation and percussion breath sounds equal no wheeze , rales or rhonchi. No chest wall deformities or tenderness. CV: PMI is nondisplaced, S1 S2 no gallops, murmurs, rubs. Peripheral pulses are full without delay.No JVD .  Breast: normal by inspection . No dimpling, discharge, masses, tenderness or discharge .  ABDOMEN: Bowel sounds normal nontender  No guard or rebound, no hepato splenomegal no CVA tenderness.  No hernia. Extremtities:  No clubbing cyanosis or edema, no acute joint swelling or redness no focal atrophy good muscle mass  Nl rom  NEURO:  Oriented x3, cranial nerves 3-12 appear to be intact, no obvious focal weakness,gait within normal limits no abnormal reflexes or asymmetrical SKIN: No acute rashes normal  turgor, color, no bruising or petechiae. PSYCH: Oriented, good eye contact, no obvious depression anxiety, cognition and judgment appear normal. Pelvic: NL ext GU, labia clear without lesions or rash . Vagina no lesions .Cervix: absent  UTERUS: absent CMT  No mass Adnexa:  clear no masses . Rectal confirmed no masses  Lab Results  Component Value Date   WBC 3.7* 11/29/2010   HGB 13.2 11/29/2010   HCT 38.8 11/29/2010   PLT 226.0 11/29/2010   GLUCOSE 95 11/29/2010   CHOL 182 11/29/2010   TRIG 44.0 11/29/2010   HDL 70.60 11/29/2010   LDLCALC 103* 11/29/2010   ALT 23 11/29/2010   AST 24 11/29/2010   NA 141 11/29/2010   K 3.8 11/29/2010   CL 108 11/29/2010   CREATININE 0.7 11/29/2010   BUN 17 11/29/2010   CO2 27 11/29/2010   TSH 1.64 11/29/2010   HGBA1C 5.7 11/06/2009           Assessment & Plan:  Preventive Health Care Continue lifestyle intervention healthy eating and exercise . Flu shot today  Up to date  on healthcare parameters per hx   disc mammo gram form signed Knee issues  Seems like Patellar tracking   Disc exercise  Muscle strengthing  Sleep episodic issues with trans oceanic travel and jet lag.     Risk benefit of medication discussed. Ambien rx call for more refills if needed  Leukopenia stable  Get yearly check

## 2010-12-03 NOTE — Assessment & Plan Note (Signed)
Runs in family and has been stable

## 2010-12-04 DIAGNOSIS — Z23 Encounter for immunization: Secondary | ICD-10-CM

## 2010-12-10 ENCOUNTER — Other Ambulatory Visit: Payer: Self-pay | Admitting: Internal Medicine

## 2010-12-10 DIAGNOSIS — Z1231 Encounter for screening mammogram for malignant neoplasm of breast: Secondary | ICD-10-CM

## 2011-01-03 ENCOUNTER — Ambulatory Visit (HOSPITAL_COMMUNITY)
Admission: RE | Admit: 2011-01-03 | Discharge: 2011-01-03 | Disposition: A | Discharge status: Home / Self Care | Payer: 59 | Source: Ambulatory Visit | Attending: Internal Medicine | Admitting: Internal Medicine

## 2011-01-03 DIAGNOSIS — Z1231 Encounter for screening mammogram for malignant neoplasm of breast: Secondary | ICD-10-CM | POA: Insufficient documentation

## 2011-04-29 ENCOUNTER — Telehealth: Payer: Self-pay | Admitting: *Deleted

## 2011-04-29 NOTE — Telephone Encounter (Signed)
Pls advise.  

## 2011-04-29 NOTE — Telephone Encounter (Signed)
Cant make a judgement based on info given  Need more information  Please document what kind of ear drop this is .What did she do after discovery of mistake s? did she irrigate her eye ? why was she using eye drops?  May need also to call pharmacist about what was in the drops.  Does she wear contacts ?  If pain or change in vision  Needs to seek medical care   .

## 2011-04-29 NOTE — Telephone Encounter (Signed)
Patient is calling because she accidentally put ear drops in her eyes.  She states that she" is fine and that there eye feels fine and her eye feel the same as when she has allergies".  She would like to know if she should come in for an office visit?

## 2011-04-30 NOTE — Telephone Encounter (Signed)
Pt states the drops were ear wax removal drops.  Pt states as soon as she realized she had put drops in her eyes she began to flush her eyes for several minutes. Pt denies any vision changes. Pt did state that her eyes were burning some.   Pt states the drops were not in her eyes for more than 30 seconds.  Pt states her eyes did not feel different but it was hard to tell due to having problems with allergies.  Today pt states her eyes feel ok.  Pt does not wear contacts.  Pt is aware of Dr. Rosezella [place] recommendations and states she understands.  Pt is going to [place] until Thursday and states she will call the office back if she has concerns.

## 2011-11-15 ENCOUNTER — Encounter: Payer: Self-pay | Admitting: Internal Medicine

## 2011-12-03 ENCOUNTER — Other Ambulatory Visit (INDEPENDENT_AMBULATORY_CARE_PROVIDER_SITE_OTHER): Payer: 59

## 2011-12-03 DIAGNOSIS — Z0000 Encounter for general adult medical examination without abnormal findings: Secondary | ICD-10-CM

## 2011-12-03 LAB — CBC WITH DIFFERENTIAL/PLATELET
Basophils Relative: 0.6 % (ref 0.0–3.0)
Eosinophils Absolute: 0.1 10*3/uL (ref 0.0–0.7)
Eosinophils Relative: 1.5 % (ref 0.0–5.0)
Hemoglobin: 13.5 g/dL (ref 12.0–15.0)
Lymphocytes Relative: 51.8 % — ABNORMAL HIGH (ref 12.0–46.0)
MCHC: 33.8 g/dL (ref 30.0–36.0)
Monocytes Relative: 11.7 % (ref 3.0–12.0)
Neutro Abs: 1.2 10*3/uL — ABNORMAL LOW (ref 1.4–7.7)
Neutrophils Relative %: 34.4 % — ABNORMAL LOW (ref 43.0–77.0)
RBC: 4.49 Mil/uL (ref 3.87–5.11)
WBC: 3.6 10*3/uL — ABNORMAL LOW (ref 4.5–10.5)

## 2011-12-03 LAB — BASIC METABOLIC PANEL
CO2: 28 mEq/L (ref 19–32)
Calcium: 9.1 mg/dL (ref 8.4–10.5)
Creatinine, Ser: 0.8 mg/dL (ref 0.4–1.2)
GFR: 90.97 mL/min (ref >60.00)
Sodium: 138 mEq/L (ref 135–145)

## 2011-12-03 LAB — POCT URINALYSIS DIPSTICK
Blood, UA: NEGATIVE
Ketones, UA: NEGATIVE
Protein, UA: NEGATIVE
Spec Grav, UA: 1.015
Urobilinogen, UA: 0.2

## 2011-12-03 LAB — LIPID PANEL
Cholesterol: 190 mg/dL (ref 0–200)
LDL Cholesterol: 116 mg/dL — ABNORMAL HIGH (ref 0–99)
Total CHOL/HDL Ratio: 3
VLDL: 10.4 mg/dL (ref 0.0–40.0)

## 2011-12-03 LAB — HEPATIC FUNCTION PANEL
ALT: 19 U/L (ref 0–35)
AST: 20 U/L (ref 0–37)
Albumin: 3.9 g/dL (ref 3.5–5.2)
Alkaline Phosphatase: 69 U/L (ref 39–117)
Bilirubin, Direct: 0.1 mg/dL (ref 0.0–0.3)
Total Protein: 7.1 g/dL (ref 6.0–8.3)

## 2011-12-10 ENCOUNTER — Encounter: Payer: Self-pay | Admitting: Internal Medicine

## 2011-12-10 ENCOUNTER — Ambulatory Visit (INDEPENDENT_AMBULATORY_CARE_PROVIDER_SITE_OTHER): Payer: 59 | Admitting: Internal Medicine

## 2011-12-10 VITALS — BP 130/80 | HR 58 | Temp 98.0°F | Ht 64.25 in | Wt 159.0 lb

## 2011-12-10 DIAGNOSIS — R7309 Other abnormal glucose: Secondary | ICD-10-CM

## 2011-12-10 DIAGNOSIS — Z0000 Encounter for general adult medical examination without abnormal findings: Secondary | ICD-10-CM

## 2011-12-10 NOTE — Patient Instructions (Signed)
Continue lifestyle intervention healthy eating and exercise . consider shoulder exercises if needed.  Wellness in a year  Can ask for hg a1c at next blood test another diabetes risk check.   Preventive Care for Adults, Female A healthy lifestyle and preventive care can promote health and wellness. Preventive health guidelines for women include the following key practices.  A routine yearly physical is a good way to check with your caregiver about your health and preventive screening. It is a chance to share any concerns and updates on your health, and to receive a thorough exam.  Visit your dentist for a routine exam and preventive care every 6 months. Brush your teeth twice a day and floss once a day. Good oral hygiene prevents tooth decay and gum disease.  The frequency of eye exams is based on your age, health, family medical history, use of contact lenses, and other factors. Follow your caregiver's recommendations for frequency of eye exams.  Eat a healthy diet. Foods like vegetables, fruits, whole grains, low-fat dairy products, and lean protein foods contain the nutrients you need without too many calories. Decrease your intake of foods high in solid fats, added sugars, and salt. Eat the right amount of calories for you.Get information about a proper diet from your caregiver, if necessary.  Regular physical exercise is one of the most important things you can do for your health. Most adults should get at least 150 minutes of moderate-intensity exercise (any activity that increases your heart rate and causes you to sweat) each week. In addition, most adults need muscle-strengthening exercises on 2 or more days a week.  Maintain a healthy weight. The body mass index (BMI) is a screening tool to identify possible weight problems. It provides an estimate of body fat based on height and weight. Your caregiver can help determine your BMI, and can help you achieve or maintain a healthy weight.For  adults 20 years and older:  A BMI below 18.5 is considered underweight.  A BMI of 18.5 to 24.9 is normal.  A BMI of 25 to 29.9 is considered overweight.  A BMI of 30 and above is considered obese.  Maintain normal blood lipids and cholesterol levels by exercising and minimizing your intake of saturated fat. Eat a balanced diet with plenty of fruit and vegetables. Blood tests for lipids and cholesterol should begin at age 19 and be repeated every 5 years. If your lipid or cholesterol levels are high, you are over 50, or you are at high risk for heart disease, you may need your cholesterol levels checked more frequently.Ongoing high lipid and cholesterol levels should be treated with medicines if diet and exercise are not effective.  If you smoke, find out from your caregiver how to quit. If you do not use tobacco, do not start.  If you are pregnant, do not drink alcohol. If you are breastfeeding, be very cautious about drinking alcohol. If you are not pregnant and choose to drink alcohol, do not exceed 1 drink per day. One drink is considered to be 12 ounces (355 mL) of beer, 5 ounces (148 mL) of wine, or 1.5 ounces (44 mL) of liquor.  Avoid use of street drugs. Do not share needles with anyone. Ask for help if you need support or instructions about stopping the use of drugs.  High blood pressure causes heart disease and increases the risk of stroke. Your blood pressure should be checked at least every 1 to 2 years. Ongoing high blood pressure should  be treated with medicines if weight loss and exercise are not effective.  If you are 24 to 57 years old, ask your caregiver if you should take aspirin to prevent strokes.  Diabetes screening involves taking a blood sample to check your fasting blood sugar level. This should be done once every 3 years, after age 50, if you are within normal weight and without risk factors for diabetes. Testing should be considered at a younger age or be carried out  more frequently if you are overweight and have at least 1 risk factor for diabetes.  Breast cancer screening is essential preventive care for women. You should practice "breast self-awareness." This means understanding the normal appearance and feel of your breasts and may include breast self-examination. Any changes detected, no matter how small, should be reported to a caregiver. Women in their 67s and 30s should have a clinical breast exam (CBE) by a caregiver as part of a regular health exam every 1 to 3 years. After age 11, women should have a CBE every year. Starting at age 62, women should consider having a mammography (breast X-ray test) every year. Women who have a family history of breast cancer should talk to their caregiver about genetic screening. Women at a high risk of breast cancer should talk to their caregivers about having magnetic resonance imaging (MRI) and a mammography every year.  The Pap test is a screening test for cervical cancer. A Pap test can show cell changes on the cervix that might become cervical cancer if left untreated. A Pap test is a procedure in which cells are obtained and examined from the lower end of the uterus (cervix).  Women should have a Pap test starting at age 26.  Between ages 45 and 37, Pap tests should be repeated every 2 years.  Beginning at age 54, you should have a Pap test every 3 years as long as the past 3 Pap tests have been normal.  Some women have medical problems that increase the chance of getting cervical cancer. Talk to your caregiver about these problems. It is especially important to talk to your caregiver if a new problem develops soon after your last Pap test. In these cases, your caregiver may recommend more frequent screening and Pap tests.  The above recommendations are the same for women who have or have not gotten the vaccine for human papillomavirus (HPV).  If you had a hysterectomy for a problem that was not cancer or a  condition that could lead to cancer, then you no longer need Pap tests. Even if you no longer need a Pap test, a regular exam is a good idea to make sure no other problems are starting.  If you are between ages 33 and 18, and you have had normal Pap tests going back 10 years, you no longer need Pap tests. Even if you no longer need a Pap test, a regular exam is a good idea to make sure no other problems are starting.  If you have had past treatment for cervical cancer or a condition that could lead to cancer, you need Pap tests and screening for cancer for at least 20 years after your treatment.  If Pap tests have been discontinued, risk factors (such as a new sexual partner) need to be reassessed to determine if screening should be resumed.  The HPV test is an additional test that may be used for cervical cancer screening. The HPV test looks for the virus that can cause  the cell changes on the cervix. The cells collected during the Pap test can be tested for HPV. The HPV test could be used to screen women aged 28 years and older, and should be used in women of any age who have unclear Pap test results. After the age of 34, women should have HPV testing at the same frequency as a Pap test.  Colorectal cancer can be detected and often prevented. Most routine colorectal cancer screening begins at the age of 11 and continues through age 41. However, your caregiver may recommend screening at an earlier age if you have risk factors for colon cancer. On a yearly basis, your caregiver may provide home test kits to check for hidden blood in the stool. Use of a small camera at the end of a tube, to directly examine the colon (sigmoidoscopy or colonoscopy), can detect the earliest forms of colorectal cancer. Talk to your caregiver about this at age 70, when routine screening begins. Direct examination of the colon should be repeated every 5 to 10 years through age 16, unless early forms of pre-cancerous polyps or  small growths are found.  Hepatitis C blood testing is recommended for all people born from [birth year] through 1965 and any individual with known risks for hepatitis C.  Practice safe sex. Use condoms and avoid high-risk sexual practices to reduce the spread of sexually transmitted infections (STIs). STIs include gonorrhea, chlamydia, syphilis, trichomonas, herpes, HPV, and human immunodeficiency virus (HIV). Herpes, HIV, and HPV are viral illnesses that have no cure. They can result in disability, cancer, and death. Sexually active women aged 56 and younger should be checked for chlamydia. Older women with new or multiple partners should also be tested for chlamydia. Testing for other STIs is recommended if you are sexually active and at increased risk.  Osteoporosis is a disease in which the bones lose minerals and strength with aging. This can result in serious bone fractures. The risk of osteoporosis can be identified using a bone density scan. Women ages 72 and over and women at risk for fractures or osteoporosis should discuss screening with their caregivers. Ask your caregiver whether you should take a calcium supplement or vitamin D to reduce the rate of osteoporosis.  Menopause can be associated with physical symptoms and risks. Hormone replacement therapy is available to decrease symptoms and risks. You should talk to your caregiver about whether hormone replacement therapy is right for you.  Use sunscreen with sun protection factor (SPF) of 30 or more. Apply sunscreen liberally and repeatedly throughout the day. You should seek shade when your shadow is shorter than you. Protect yourself by wearing long sleeves, pants, a wide-brimmed hat, and sunglasses year round, whenever you are outdoors.  Once a month, do a whole body skin exam, using a mirror to look at the skin on your back. Notify your caregiver of new moles, moles that have irregular borders, moles that are larger than a pencil eraser, or  moles that have changed in shape or color.  Stay current with required immunizations.  Influenza. You need a dose every fall (or winter). The composition of the flu vaccine changes each year, so being vaccinated once is not enough.  Pneumococcal polysaccharide. You need 1 to 2 doses if you smoke cigarettes or if you have certain chronic medical conditions. You need 1 dose at age 8 (or older) if you have never been vaccinated.  Tetanus, diphtheria, pertussis (Tdap, Td). Get 1 dose of Tdap vaccine if you are  younger than age 25, are over 81 and have contact with an infant, are a Research scientist (physical sciences), are pregnant, or simply want to be protected from whooping cough. After that, you need a Td booster dose every 10 years. Consult your caregiver if you have not had at least 3 tetanus and diphtheria-containing shots sometime in your life or have a deep or dirty wound.  HPV. You need this vaccine if you are a woman age 68 or younger. The vaccine is given in 3 doses over 6 months.  Measles, mumps, rubella (MMR). You need at least 1 dose of MMR if you were born in 1957 or later. You may also need a second dose.  Meningococcal. If you are age 71 to 18 and a first-year college student living in a residence hall, or have one of several medical conditions, you need to get vaccinated against meningococcal disease. You may also need additional booster doses.  Zoster (shingles). If you are age 40 or older, you should get this vaccine.  Varicella (chickenpox). If you have never had chickenpox or you were vaccinated but received only 1 dose, talk to your caregiver to find out if you need this vaccine.  Hepatitis A. You need this vaccine if you have a specific risk factor for hepatitis A virus infection or you simply wish to be protected from this disease. The vaccine is usually given as 2 doses, 6 to 18 months apart.  Hepatitis B. You need this vaccine if you have a specific risk factor for hepatitis B virus  infection or you simply wish to be protected from this disease. The vaccine is given in 3 doses, usually over 6 months. Preventive Services / Frequency Ages 64 to 13  Blood pressure check.** / Every 1 to 2 years.  Lipid and cholesterol check.** / Every 5 years beginning at age 41.  Clinical breast exam.** / Every year after age 21.  Mammogram.** / Every year beginning at age 59 and continuing for as long as you are in good health. Consult with your caregiver.  Pap test.** / Every 3 years starting at age 68 through age 49 or 51 with a history of 3 consecutive normal Pap tests.  HPV screening.** / Every 3 years from ages 31 through ages 7 to 11 with a history of 3 consecutive normal Pap tests.  Fecal occult blood test (FOBT) of stool. / Every year beginning at age 41 and continuing until age 39. You may not need to do this test if you get a colonoscopy every 10 years.  Flexible sigmoidoscopy or colonoscopy.** / Every 5 years for a flexible sigmoidoscopy or every 10 years for a colonoscopy beginning at age 45 and continuing until age 19.  Hepatitis C blood test.** / For all people born from [birth year] through 1965 and any individual with known risks for hepatitis C.  Skin self-exam. / Monthly.  Influenza immunization.** / Every year.  Pneumococcal polysaccharide immunization.** / 1 to 2 doses if you smoke cigarettes or if you have certain chronic medical conditions.  Tetanus, diphtheria, pertussis (Tdap, Td) immunization.** / A one-time dose of Tdap vaccine. After that, you need a Td booster dose every 10 years.  Measles, mumps, rubella (MMR) immunization. / You need at least 1 dose of MMR if you were born in 1957 or later. You may also need a second dose.  Varicella immunization.** / Consult your caregiver.  Meningococcal immunization.** / Consult your caregiver.  Hepatitis A immunization.** / Consult your caregiver. 2 doses,  6 to 18 months apart.  Hepatitis B immunization.** / Consult  your caregiver. 3 doses, usually over 6 months. ** Family history and personal history of risk and conditions may change your caregiver's recommendations. Document Released: 02/26/2001 Document Revised: 03/25/2011 Document Reviewed: 05/28/2010 [facility] Patient Information 2013 [place], [place].   Shoulder Exercises EXERCISES  RANGE OF MOTION (ROM) AND STRETCHING EXERCISES These exercises may help you when beginning to rehabilitate your injury. Your symptoms may resolve with or without further involvement from your physician, physical therapist or athletic trainer. While completing these exercises, remember:   Restoring tissue flexibility helps normal motion to return to the joints. This allows healthier, less painful movement and activity.  An effective stretch should be held for at least 30 seconds.  A stretch should never be painful. You should only feel a gentle lengthening or release in the stretched tissue. ROM - Pendulum  Bend at the waist so that your right / left arm falls away from your body. Support yourself with your opposite hand on a solid surface, such as a table or a countertop.  Your right / left arm should be perpendicular to the ground. If it is not perpendicular, you need to lean over farther. Relax the muscles in your right / left arm and shoulder as much as possible.  Gently sway your hips and trunk so they move your right / left arm without any use of your right / left shoulder muscles.  Progress your movements so that your right / left arm moves side to side, then forward and backward, and finally, both clockwise and counterclockwise.  Complete __________ repetitions in each direction. Many people use this exercise to relieve discomfort in their shoulder as well as to gain range of motion. Repeat __________ times. Complete this exercise __________ times per day. STRETCH  Flexion, Standing  Stand with good posture. With an underhand grip on your right / left hand  and an overhand grip on the opposite hand, grasp a broomstick or cane so that your hands are a little more than shoulder-width apart.  Keeping your right / left elbow straight and shoulder muscles relaxed, push the stick with your opposite hand to raise your right / left arm in front of your body and then overhead. Raise your arm until you feel a stretch in your right / left shoulder, but before you have increased shoulder pain.  Try to avoid shrugging your right / left shoulder as your arm rises by keeping your shoulder blade tucked down and toward your mid-back spine. Hold __________ seconds.  Slowly return to the starting position. Repeat __________ times. Complete this exercise __________ times per day. STRETCH - Internal Rotation  Place your right / left hand behind your back, palm-up.  Throw a towel or belt over your opposite shoulder. Grasp the towel/belt with your right / left hand.  While keeping an upright posture, gently pull up on the towel/belt until you feel a stretch in the front of your right / left shoulder.  Avoid shrugging your right / left shoulder as your arm rises by keeping your shoulder blade tucked down and toward your mid-back spine.  Hold __________. Release the stretch by lowering your opposite hand. Repeat __________ times. Complete this exercise __________ times per day. STRETCH - External Rotation and Abduction  Stagger your stance through a doorframe. It does not matter which foot is forward.  As instructed by your physician, physical therapist or athletic trainer, place your hands:  And forearms above your  head and on the door frame.  And forearms at head-height and on the door frame.  At elbow-height and on the door frame.  Keeping your head and chest upright and your stomach muscles tight to prevent over-extending your low-back, slowly shift your weight onto your front foot until you feel a stretch across your chest and/or in the front of your  shoulders.  Hold __________ seconds. Shift your weight to your back foot to release the stretch. Repeat __________ times. Complete this stretch __________ times per day.  STRENGTHENING EXERCISES  These exercises may help you when beginning to rehabilitate your injury. They may resolve your symptoms with or without further involvement from your physician, physical therapist or athletic trainer. While completing these exercises, remember:   Muscles can gain both the endurance and the strength needed for everyday activities through controlled exercises.  Complete these exercises as instructed by your physician, physical therapist or athletic trainer. Progress the resistance and repetitions only as guided.  You may experience muscle soreness or fatigue, but the pain or discomfort you are trying to eliminate should never worsen during these exercises. If this pain does worsen, stop and make certain you are following the directions exactly. If the pain is still present after adjustments, discontinue the exercise until you can discuss the trouble with your clinician.  If advised by your physician, during your recovery, avoid activity or exercises which involve actions that place your right / left hand or elbow above your head or behind your back or head. These positions stress the tissues which are trying to heal. STRENGTH - Scapular Depression and Adduction  With good posture, sit on a firm chair. Supported your arms in front of you with pillows, arm rests or a table top. Have your elbows in line with the sides of your body.  Gently draw your shoulder blades down and toward your mid-back spine. Gradually increase the tension without tensing the muscles along the top of your shoulders and the back of your neck.  Hold for __________ seconds. Slowly release the tension and relax your muscles completely before completing the next repetition.  After you have practiced this exercise, remove the arm support  and complete it in standing as well as sitting. Repeat __________ times. Complete this exercise __________ times per day.  STRENGTH - External Rotators  Secure a rubber exercise band/tubing to a fixed object so that it is at the same height as your right / left elbow when you are standing or sitting on a firm surface.  Stand or sit so that the secured exercise band/tubing is at your side that is not injured.  Bend your elbow 90 degrees. Place a folded towel or small pillow under your right / left arm so that your elbow is a few inches away from your side.  Keeping the tension on the exercise band/tubing, pull it away from your body, as if pivoting on your elbow. Be sure to keep your body steady so that the movement is only coming from your shoulder rotating.  Hold __________ seconds. Release the tension in a controlled manner as you return to the starting position. Repeat __________ times. Complete this exercise __________ times per day.  STRENGTH - Supraspinatus  Stand or sit with good posture. Grasp a __________ weight or an exercise band/tubing so that your hand is "thumbs-up," like when you shake hands.  Slowly lift your right / left hand from your thigh into the air, traveling about 30 degrees from straight out at  your side. Lift your hand to shoulder height or as far as you can without increasing any shoulder pain. Initially, many people do not lift their hands above shoulder height.  Avoid shrugging your right / left shoulder as your arm rises by keeping your shoulder blade tucked down and toward your mid-back spine.  Hold for __________ seconds. Control the descent of your hand as you slowly return to your starting position. Repeat __________ times. Complete this exercise __________ times per day.  STRENGTH - Shoulder Extensors  Secure a rubber exercise band/tubing so that it is at the height of your shoulders when you are either standing or sitting on a firm arm-less chair.  With  a thumbs-up grip, grasp an end of the band/tubing in each hand. Straighten your elbows and lift your hands straight in front of you at shoulder height. Step back away from the secured end of band/tubing until it becomes tense.  Squeezing your shoulder blades together, pull your hands down to the sides of your thighs. Do not allow your hands to go behind you.  Hold for __________ seconds. Slowly ease the tension on the band/tubing as you reverse the directions and return to the starting position. Repeat __________ times. Complete this exercise __________ times per day.  STRENGTH - Scapular Retractors  Secure a rubber exercise band/tubing so that it is at the height of your shoulders when you are either standing or sitting on a firm arm-less chair.  With a palm-down grip, grasp an end of the band/tubing in each hand. Straighten your elbows and lift your hands straight in front of you at shoulder height. Step back away from the secured end of band/tubing until it becomes tense.  Squeezing your shoulder blades together, draw your elbows back as you bend them. Keep your upper arm lifted away from your body throughout the exercise.  Hold __________ seconds. Slowly ease the tension on the band/tubing as you reverse the directions and return to the starting position. Repeat __________ times. Complete this exercise __________ times per day. STRENGTH  Scapular Depressors  Find a sturdy chair without wheels, such as a from a dining room table.  Keeping your feet on the floor, lift your bottom from the seat and lock your elbows.  Keeping your elbows straight, allow gravity to pull your body weight down. Your shoulders will rise toward your ears.  Raise your body against gravity by drawing your shoulder blades down your back, shortening the distance between your shoulders and ears. Although your feet should always maintain contact with the floor, your feet should progressively support less body weight as  you get stronger.  Hold __________ seconds. In a controlled and slow manner, lower your body weight to begin the next repetition. Repeat __________ times. Complete this exercise __________ times per day.  Document Released: 11/14/2004 Document Revised: 03/25/2011 Document Reviewed: 04/14/2008 [facility] Patient Information 2013 [place], [place].

## 2011-12-10 NOTE — Progress Notes (Signed)
Chief Complaint  Patient presents with  . Annual Exam    Has form    HPI: Patient comes in today for Preventive Health Care visit  No major change in health status since last visit . No major injuries hospitalization or emergency room visits. She is a couple questions she's noticed that her memory to retrieve given names has decreased since menopausal symptoms. There is a family history of Alzheimer's and the non-first-degree relatives who are older than 80 no acute findings. She thinks that she sleeps well tries to exercise. Had laboratory studies done due for mammogram this year. Try seeing a healthy diet Mediterranean type.  ROS:  GEN/ HEENT: No fever, significant weight changes sweats headaches vision problems hearing changes, CV/ PULM; No chest pain shortness of breath cough, syncope,edema  change in exercise tolerance. GI /GU: No adominal pain, vomiting, change in bowel habits. No blood in the stool. No significant GU symptoms. SKIN/HEME: ,no acute skin rashes suspicious lesions or bleeding. No lymphadenopathy, nodules, masses.  NEURO/ PSYCH:  No neurologic signs such as weakness numbness. No depression anxiety. IMM/ Allergy: No unusual infections.  Allergy .   REST of 12 system review negative except as per HPI Shoulder issue when sleeps on this   No acute injury   Past Medical History  Diagnosis Date  . Hay fever   . Fibroids     uterine  . Anemia   . IBS (irritable bowel syndrome)   . WBC decreased   . Hx of colonoscopy     utd  . Hx of varicella     as a child  . Back pain     Family History  Problem Relation Age of Onset  . Arthritis Mother   . Cancer Father     bladder  . Hyperlipidemia Father   . Migraines Sister   . Hyperlipidemia Sister     low wbc  . Dementia      older aunt uncle gm 74s and 33s    History   Social History  . Marital Status: Single    Spouse Name: N/A    Number of Children: N/A  . Years of Education: N/A   Social History Main  Topics  . Smoking status: Never Smoker   . Smokeless tobacco: None  . Alcohol Use: Yes  . Drug Use: No  . Sexually Active: None   Other Topics Concern  . None   Social History Narrative   Never SmokedAlcohol use-yesRegular Exercise-yesDrug use- nohh of 2 Pet dogMarried education PhDExecutive education cclTravels a lot internationally 2 weeks out of each month aboutChildbirth Hx    No outpatient encounter prescriptions on file as of 12/10/2011.    Mom had forgetfullness  And  Uncle and other.  In 80s  EXAM:  BP 130/80  Pulse 58  Temp 98 F (36.7 C) (Oral)  Ht 5' 4.25" (1.632 m)  Wt 159 lb (72.122 kg)  BMI 27.08 kg/m2  SpO2 98%  Body mass index is 27.08 kg/(m^2).  Physical Exam: Vital signs reviewed [id number] is a well-developed well-nourished alert cooperative   female who appears her stated age in no acute distress.  HEENT: normocephalic atraumatic , Eyes: PERRL EOM's full, conjunctiva clear, Nares: paten,t no deformity discharge or tenderness., Ears: no deformity EAC's clear TMs with normal landmarks. Mouth: clear OP, no lesions, edema.  Moist mucous membranes. Dentition in adequate repair. NECK: supple without masses, thyromegaly or bruits. CHEST/PULM:  Clear to auscultation and percussion breath sounds equal no  wheeze , rales or rhonchi. No chest wall deformities or tenderness. Breast: normal by inspection . No dimpling, discharge, masses, tenderness or discharge . CV: PMI is nondisplaced, S1 S2 no gallops, murmurs, rubs. Peripheral pulses are full without delay.No JVD .  ABDOMEN: Bowel sounds normal nontender  No guard or rebound, no hepato splenomegal no CVA tenderness.  No hernia. Extremtities:  No clubbing cyanosis or edema, no acute joint swelling or redness no focal atrophy shoulder good rom points to anterior shoudler as area of wehre has problems at times  NEURO:  Oriented x3, cranial nerves 3-12 appear to be intact, no obvious focal weakness,gait within normal  limits no abnormal reflexes or asymmetrical SKIN: No acute rashes normal turgor, color, no bruising or petechiae. PSYCH: Oriented, good eye contact, no obvious depression anxiety, cognition and judgment appear normal. LN: no cervical axillary inguinal adenopathy  Lab Results  Component Value Date   WBC 3.6* 12/03/2011   HGB 13.5 12/03/2011   HCT 40.0 12/03/2011   PLT 215.0 12/03/2011   GLUCOSE 101* 12/03/2011   CHOL 190 12/03/2011   TRIG 52.0 12/03/2011   HDL 63.40 12/03/2011   LDLCALC 116* 12/03/2011   ALT 19 12/03/2011   AST 20 12/03/2011   NA 138 12/03/2011   K 4.2 12/03/2011   CL 104 12/03/2011   CREATININE 0.8 12/03/2011   BUN 20 12/03/2011   CO2 28 12/03/2011   TSH 1.18 12/03/2011   HGBA1C 5.7 11/06/2009    ASSESSMENT AND PLAN:  Discussed the following assessment and plan:  1. Visit for preventive health examination   2. HYPERGLYCEMIA    borderline fastin just above 100   Preventive Health Care Counseled regarding healthy nutrition, exercise, sleep, injury prevention, calcium vit d and healthy weight . Form completed . Disc memory and brain health  .  bg borderline  Can check a1c at next blood tests  Patient Instructions  Continue lifestyle intervention healthy eating and exercise . consider shoulder exercises if needed.  Wellness in a year  Can ask for hg a1c at next blood test another diabetes risk check.   Preventive Care for Adults, Female A healthy lifestyle and preventive care can promote health and wellness. Preventive health guidelines for women include the following key practices.  A routine yearly physical is a good way to check with your caregiver about your health and preventive screening. It is a chance to share any concerns and updates on your health, and to receive a thorough exam.  Visit your dentist for a routine exam and preventive care every 6 months. Brush your teeth twice a day and floss once a day. Good oral hygiene prevents tooth decay  and gum disease.  The frequency of eye exams is based on your age, health, family medical history, use of contact lenses, and other factors. Follow your caregiver's recommendations for frequency of eye exams.  Eat a healthy diet. Foods like vegetables, fruits, whole grains, low-fat dairy products, and lean protein foods contain the nutrients you need without too many calories. Decrease your intake of foods high in solid fats, added sugars, and salt. Eat the right amount of calories for you.Get information about a proper diet from your caregiver, if necessary.  Regular physical exercise is one of the most important things you can do for your health. Most adults should get at least 150 minutes of moderate-intensity exercise (any activity that increases your heart rate and causes you to sweat) each week. In addition, most adults need muscle-strengthening exercises  on 2 or more days a week.  Maintain a healthy weight. The body mass index (BMI) is a screening tool to identify possible weight problems. It provides an estimate of body fat based on height and weight. Your caregiver can help determine your BMI, and can help you achieve or maintain a healthy weight.For adults 20 years and older:  A BMI below 18.5 is considered underweight.  A BMI of 18.5 to 24.9 is normal.  A BMI of 25 to 29.9 is considered overweight.  A BMI of 30 and above is considered obese.  Maintain normal blood lipids and cholesterol levels by exercising and minimizing your intake of saturated fat. Eat a balanced diet with plenty of fruit and vegetables. Blood tests for lipids and cholesterol should begin at age 35 and be repeated every 5 years. If your lipid or cholesterol levels are high, you are over 50, or you are at high risk for heart disease, you may need your cholesterol levels checked more frequently.Ongoing high lipid and cholesterol levels should be treated with medicines if diet and exercise are not effective.  If you  smoke, find out from your caregiver how to quit. If you do not use tobacco, do not start.  If you are pregnant, do not drink alcohol. If you are breastfeeding, be very cautious about drinking alcohol. If you are not pregnant and choose to drink alcohol, do not exceed 1 drink per day. One drink is considered to be 12 ounces (355 mL) of beer, 5 ounces (148 mL) of wine, or 1.5 ounces (44 mL) of liquor.  Avoid use of street drugs. Do not share needles with anyone. Ask for help if you need support or instructions about stopping the use of drugs.  High blood pressure causes heart disease and increases the risk of stroke. Your blood pressure should be checked at least every 1 to 2 years. Ongoing high blood pressure should be treated with medicines if weight loss and exercise are not effective.  If you are 53 to 57 years old, ask your caregiver if you should take aspirin to prevent strokes.  Diabetes screening involves taking a blood sample to check your fasting blood sugar level. This should be done once every 3 years, after age 53, if you are within normal weight and without risk factors for diabetes. Testing should be considered at a younger age or be carried out more frequently if you are overweight and have at least 1 risk factor for diabetes.  Breast cancer screening is essential preventive care for women. You should practice "breast self-awareness." This means understanding the normal appearance and feel of your breasts and may include breast self-examination. Any changes detected, no matter how small, should be reported to a caregiver. Women in their 19s and 30s should have a clinical breast exam (CBE) by a caregiver as part of a regular health exam every 1 to 3 years. After age 25, women should have a CBE every year. Starting at age 25, women should consider having a mammography (breast X-ray test) every year. Women who have a family history of breast cancer should talk to their caregiver about genetic  screening. Women at a high risk of breast cancer should talk to their caregivers about having magnetic resonance imaging (MRI) and a mammography every year.  The Pap test is a screening test for cervical cancer. A Pap test can show cell changes on the cervix that might become cervical cancer if left untreated. A Pap test is a procedure  in which cells are obtained and examined from the lower end of the uterus (cervix).  Women should have a Pap test starting at age 67.  Between ages 19 and 1, Pap tests should be repeated every 2 years.  Beginning at age 43, you should have a Pap test every 3 years as long as the past 3 Pap tests have been normal.  Some women have medical problems that increase the chance of getting cervical cancer. Talk to your caregiver about these problems. It is especially important to talk to your caregiver if a new problem develops soon after your last Pap test. In these cases, your caregiver may recommend more frequent screening and Pap tests.  The above recommendations are the same for women who have or have not gotten the vaccine for human papillomavirus (HPV).  If you had a hysterectomy for a problem that was not cancer or a condition that could lead to cancer, then you no longer need Pap tests. Even if you no longer need a Pap test, a regular exam is a good idea to make sure no other problems are starting.  If you are between ages 24 and 21, and you have had normal Pap tests going back 10 years, you no longer need Pap tests. Even if you no longer need a Pap test, a regular exam is a good idea to make sure no other problems are starting.  If you have had past treatment for cervical cancer or a condition that could lead to cancer, you need Pap tests and screening for cancer for at least 20 years after your treatment.  If Pap tests have been discontinued, risk factors (such as a new sexual partner) need to be reassessed to determine if screening should be resumed.  The  HPV test is an additional test that may be used for cervical cancer screening. The HPV test looks for the virus that can cause the cell changes on the cervix. The cells collected during the Pap test can be tested for HPV. The HPV test could be used to screen women aged 8 years and older, and should be used in women of any age who have unclear Pap test results. After the age of 20, women should have HPV testing at the same frequency as a Pap test.  Colorectal cancer can be detected and often prevented. Most routine colorectal cancer screening begins at the age of 74 and continues through age 49. However, your caregiver may recommend screening at an earlier age if you have risk factors for colon cancer. On a yearly basis, your caregiver may provide home test kits to check for hidden blood in the stool. Use of a small camera at the end of a tube, to directly examine the colon (sigmoidoscopy or colonoscopy), can detect the earliest forms of colorectal cancer. Talk to your caregiver about this at age 42, when routine screening begins. Direct examination of the colon should be repeated every 5 to 10 years through age 16, unless early forms of pre-cancerous polyps or small growths are found.  Hepatitis C blood testing is recommended for all people born from [birth year] through 1965 and any individual with known risks for hepatitis C.  Practice safe sex. Use condoms and avoid high-risk sexual practices to reduce the spread of sexually transmitted infections (STIs). STIs include gonorrhea, chlamydia, syphilis, trichomonas, herpes, HPV, and human immunodeficiency virus (HIV). Herpes, HIV, and HPV are viral illnesses that have no cure. They can result in disability, cancer, and death. Sexually  active women aged 71 and younger should be checked for chlamydia. Older women with new or multiple partners should also be tested for chlamydia. Testing for other STIs is recommended if you are sexually active and at increased  risk.  Osteoporosis is a disease in which the bones lose minerals and strength with aging. This can result in serious bone fractures. The risk of osteoporosis can be identified using a bone density scan. Women ages 66 and over and women at risk for fractures or osteoporosis should discuss screening with their caregivers. Ask your caregiver whether you should take a calcium supplement or vitamin D to reduce the rate of osteoporosis.  Menopause can be associated with physical symptoms and risks. Hormone replacement therapy is available to decrease symptoms and risks. You should talk to your caregiver about whether hormone replacement therapy is right for you.  Use sunscreen with sun protection factor (SPF) of 30 or more. Apply sunscreen liberally and repeatedly throughout the day. You should seek shade when your shadow is shorter than you. Protect yourself by wearing long sleeves, pants, a wide-brimmed hat, and sunglasses year round, whenever you are outdoors.  Once a month, do a whole body skin exam, using a mirror to look at the skin on your back. Notify your caregiver of new moles, moles that have irregular borders, moles that are larger than a pencil eraser, or moles that have changed in shape or color.  Stay current with required immunizations.  Influenza. You need a dose every fall (or winter). The composition of the flu vaccine changes each year, so being vaccinated once is not enough.  Pneumococcal polysaccharide. You need 1 to 2 doses if you smoke cigarettes or if you have certain chronic medical conditions. You need 1 dose at age 60 (or older) if you have never been vaccinated.  Tetanus, diphtheria, pertussis (Tdap, Td). Get 1 dose of Tdap vaccine if you are younger than age 43, are over 97 and have contact with an infant, are a Research scientist (physical sciences), are pregnant, or simply want to be protected from whooping cough. After that, you need a Td booster dose every 10 years. Consult your caregiver if  you have not had at least 3 tetanus and diphtheria-containing shots sometime in your life or have a deep or dirty wound.  HPV. You need this vaccine if you are a woman age 68 or younger. The vaccine is given in 3 doses over 6 months.  Measles, mumps, rubella (MMR). You need at least 1 dose of MMR if you were born in 1957 or later. You may also need a second dose.  Meningococcal. If you are age 44 to 28 and a first-year college student living in a residence hall, or have one of several medical conditions, you need to get vaccinated against meningococcal disease. You may also need additional booster doses.  Zoster (shingles). If you are age 55 or older, you should get this vaccine.  Varicella (chickenpox). If you have never had chickenpox or you were vaccinated but received only 1 dose, talk to your caregiver to find out if you need this vaccine.  Hepatitis A. You need this vaccine if you have a specific risk factor for hepatitis A virus infection or you simply wish to be protected from this disease. The vaccine is usually given as 2 doses, 6 to 18 months apart.  Hepatitis B. You need this vaccine if you have a specific risk factor for hepatitis B virus infection or you simply wish to be  protected from this disease. The vaccine is given in 3 doses, usually over 6 months. Preventive Services / Frequency Ages 74 to 45  Blood pressure check.** / Every 1 to 2 years.  Lipid and cholesterol check.** / Every 5 years beginning at age 36.  Clinical breast exam.** / Every year after age 15.  Mammogram.** / Every year beginning at age 83 and continuing for as long as you are in good health. Consult with your caregiver.  Pap test.** / Every 3 years starting at age 62 through age 70 or 43 with a history of 3 consecutive normal Pap tests.  HPV screening.** / Every 3 years from ages 46 through ages 46 to 69 with a history of 3 consecutive normal Pap tests.  Fecal occult blood test (FOBT) of stool. /  Every year beginning at age 13 and continuing until age 28. You may not need to do this test if you get a colonoscopy every 10 years.  Flexible sigmoidoscopy or colonoscopy.** / Every 5 years for a flexible sigmoidoscopy or every 10 years for a colonoscopy beginning at age 5 and continuing until age 87.  Hepatitis C blood test.** / For all people born from [birth year] through 1966 and any individual with known risks for hepatitis C.  Skin self-exam. / Monthly.  Influenza immunization.** / Every year.  Pneumococcal polysaccharide immunization.** / 1 to 2 doses if you smoke cigarettes or if you have certain chronic medical conditions.  Tetanus, diphtheria, pertussis (Tdap, Td) immunization.** / A one-time dose of Tdap vaccine. After that, you need a Td booster dose every 10 years.  Measles, mumps, rubella (MMR) immunization. / You need at least 1 dose of MMR if you were born in 1957 or later. You may also need a second dose.  Varicella immunization.** / Consult your caregiver.  Meningococcal immunization.** / Consult your caregiver.  Hepatitis A immunization.** / Consult your caregiver. 2 doses, 6 to 18 months apart.  Hepatitis B immunization.** / Consult your caregiver. 3 doses, usually over 6 months. ** Family history and personal history of risk and conditions may change your caregiver's recommendations. Document Released: 02/26/2001 Document Revised: 03/25/2011 Document Reviewed: 05/28/2010 [facility] Patient Information 2013 [place], [place].   Shoulder Exercises EXERCISES  RANGE OF MOTION (ROM) AND STRETCHING EXERCISES These exercises may help you when beginning to rehabilitate your injury. Your symptoms may resolve with or without further involvement from your physician, physical therapist or athletic trainer. While completing these exercises, remember:   Restoring tissue flexibility helps normal motion to return to the joints. This allows healthier, less painful movement and  activity.  An effective stretch should be held for at least 30 seconds.  A stretch should never be painful. You should only feel a gentle lengthening or release in the stretched tissue. ROM - Pendulum  Bend at the waist so that your right / left arm falls away from your body. Support yourself with your opposite hand on a solid surface, such as a table or a countertop.  Your right / left arm should be perpendicular to the ground. If it is not perpendicular, you need to lean over farther. Relax the muscles in your right / left arm and shoulder as much as possible.  Gently sway your hips and trunk so they move your right / left arm without any use of your right / left shoulder muscles.  Progress your movements so that your right / left arm moves side to side, then forward and backward, and finally, both  clockwise and counterclockwise.  Complete __________ repetitions in each direction. Many people use this exercise to relieve discomfort in their shoulder as well as to gain range of motion. Repeat __________ times. Complete this exercise __________ times per day. STRETCH  Flexion, Standing  Stand with good posture. With an underhand grip on your right / left hand and an overhand grip on the opposite hand, grasp a broomstick or cane so that your hands are a little more than shoulder-width apart.  Keeping your right / left elbow straight and shoulder muscles relaxed, push the stick with your opposite hand to raise your right / left arm in front of your body and then overhead. Raise your arm until you feel a stretch in your right / left shoulder, but before you have increased shoulder pain.  Try to avoid shrugging your right / left shoulder as your arm rises by keeping your shoulder blade tucked down and toward your mid-back spine. Hold __________ seconds.  Slowly return to the starting position. Repeat __________ times. Complete this exercise __________ times per day. STRETCH - Internal  Rotation  Place your right / left hand behind your back, palm-up.  Throw a towel or belt over your opposite shoulder. Grasp the towel/belt with your right / left hand.  While keeping an upright posture, gently pull up on the towel/belt until you feel a stretch in the front of your right / left shoulder.  Avoid shrugging your right / left shoulder as your arm rises by keeping your shoulder blade tucked down and toward your mid-back spine.  Hold __________. Release the stretch by lowering your opposite hand. Repeat __________ times. Complete this exercise __________ times per day. STRETCH - External Rotation and Abduction  Stagger your stance through a doorframe. It does not matter which foot is forward.  As instructed by your physician, physical therapist or athletic trainer, place your hands:  And forearms above your head and on the door frame.  And forearms at head-height and on the door frame.  At elbow-height and on the door frame.  Keeping your head and chest upright and your stomach muscles tight to prevent over-extending your low-back, slowly shift your weight onto your front foot until you feel a stretch across your chest and/or in the front of your shoulders.  Hold __________ seconds. Shift your weight to your back foot to release the stretch. Repeat __________ times. Complete this stretch __________ times per day.  STRENGTHENING EXERCISES  These exercises may help you when beginning to rehabilitate your injury. They may resolve your symptoms with or without further involvement from your physician, physical therapist or athletic trainer. While completing these exercises, remember:   Muscles can gain both the endurance and the strength needed for everyday activities through controlled exercises.  Complete these exercises as instructed by your physician, physical therapist or athletic trainer. Progress the resistance and repetitions only as guided.  You may experience muscle  soreness or fatigue, but the pain or discomfort you are trying to eliminate should never worsen during these exercises. If this pain does worsen, stop and make certain you are following the directions exactly. If the pain is still present after adjustments, discontinue the exercise until you can discuss the trouble with your clinician.  If advised by your physician, during your recovery, avoid activity or exercises which involve actions that place your right / left hand or elbow above your head or behind your back or head. These positions stress the tissues which are trying to heal.  STRENGTH - Scapular Depression and Adduction  With good posture, sit on a firm chair. Supported your arms in front of you with pillows, arm rests or a table top. Have your elbows in line with the sides of your body.  Gently draw your shoulder blades down and toward your mid-back spine. Gradually increase the tension without tensing the muscles along the top of your shoulders and the back of your neck.  Hold for __________ seconds. Slowly release the tension and relax your muscles completely before completing the next repetition.  After you have practiced this exercise, remove the arm support and complete it in standing as well as sitting. Repeat __________ times. Complete this exercise __________ times per day.  STRENGTH - External Rotators  Secure a rubber exercise band/tubing to a fixed object so that it is at the same height as your right / left elbow when you are standing or sitting on a firm surface.  Stand or sit so that the secured exercise band/tubing is at your side that is not injured.  Bend your elbow 90 degrees. Place a folded towel or small pillow under your right / left arm so that your elbow is a few inches away from your side.  Keeping the tension on the exercise band/tubing, pull it away from your body, as if pivoting on your elbow. Be sure to keep your body steady so that the movement is only coming  from your shoulder rotating.  Hold __________ seconds. Release the tension in a controlled manner as you return to the starting position. Repeat __________ times. Complete this exercise __________ times per day.  STRENGTH - Supraspinatus  Stand or sit with good posture. Grasp a __________ weight or an exercise band/tubing so that your hand is "thumbs-up," like when you shake hands.  Slowly lift your right / left hand from your thigh into the air, traveling about 30 degrees from straight out at your side. Lift your hand to shoulder height or as far as you can without increasing any shoulder pain. Initially, many people do not lift their hands above shoulder height.  Avoid shrugging your right / left shoulder as your arm rises by keeping your shoulder blade tucked down and toward your mid-back spine.  Hold for __________ seconds. Control the descent of your hand as you slowly return to your starting position. Repeat __________ times. Complete this exercise __________ times per day.  STRENGTH - Shoulder Extensors  Secure a rubber exercise band/tubing so that it is at the height of your shoulders when you are either standing or sitting on a firm arm-less chair.  With a thumbs-up grip, grasp an end of the band/tubing in each hand. Straighten your elbows and lift your hands straight in front of you at shoulder height. Step back away from the secured end of band/tubing until it becomes tense.  Squeezing your shoulder blades together, pull your hands down to the sides of your thighs. Do not allow your hands to go behind you.  Hold for __________ seconds. Slowly ease the tension on the band/tubing as you reverse the directions and return to the starting position. Repeat __________ times. Complete this exercise __________ times per day.  STRENGTH - Scapular Retractors  Secure a rubber exercise band/tubing so that it is at the height of your shoulders when you are either standing or sitting on a firm  arm-less chair.  With a palm-down grip, grasp an end of the band/tubing in each hand. Straighten your elbows and lift your hands  straight in front of you at shoulder height. Step back away from the secured end of band/tubing until it becomes tense.  Squeezing your shoulder blades together, draw your elbows back as you bend them. Keep your upper arm lifted away from your body throughout the exercise.  Hold __________ seconds. Slowly ease the tension on the band/tubing as you reverse the directions and return to the starting position. Repeat __________ times. Complete this exercise __________ times per day. STRENGTH  Scapular Depressors  Find a sturdy chair without wheels, such as a from a dining room table.  Keeping your feet on the floor, lift your bottom from the seat and lock your elbows.  Keeping your elbows straight, allow gravity to pull your body weight down. Your shoulders will rise toward your ears.  Raise your body against gravity by drawing your shoulder blades down your back, shortening the distance between your shoulders and ears. Although your feet should always maintain contact with the floor, your feet should progressively support less body weight as you get stronger.  Hold __________ seconds. In a controlled and slow manner, lower your body weight to begin the next repetition. Repeat __________ times. Complete this exercise __________ times per day.  Document Released: 11/14/2004 Document Revised: 03/25/2011 Document Reviewed: 04/14/2008 [facility] Patient Information 2013 [place], [place].        Neta Mends. Panosh M.D.

## 2011-12-31 ENCOUNTER — Other Ambulatory Visit: Payer: 59

## 2012-01-06 ENCOUNTER — Encounter: Payer: 59 | Admitting: Internal Medicine

## 2012-10-30 ENCOUNTER — Telehealth: Payer: Self-pay | Admitting: Internal Medicine

## 2012-10-30 NOTE — Telephone Encounter (Signed)
Any time   Except Friday 5 and  The Day after thanksgiving Just leave enough slots for sdas   Wednesdays 12 and 12 30  Are 30 minutes

## 2012-10-30 NOTE — Telephone Encounter (Signed)
pls advise

## 2012-10-30 NOTE — Telephone Encounter (Signed)
Pt states due to insurance she and her husband Stacey Young(MRN [id number]) need to be worked in before the end of the year for complete physicals.  Please advise if/when the both can be worked in.

## 2012-11-30 NOTE — Telephone Encounter (Signed)
Pt scheduled for work in cpx on 12/09/12 @ 945am

## 2012-12-02 ENCOUNTER — Other Ambulatory Visit (INDEPENDENT_AMBULATORY_CARE_PROVIDER_SITE_OTHER): Payer: 59

## 2012-12-02 DIAGNOSIS — Z0000 Encounter for general adult medical examination without abnormal findings: Secondary | ICD-10-CM

## 2012-12-02 LAB — LIPID PANEL
LDL Cholesterol: 111 mg/dL — ABNORMAL HIGH (ref 0–99)
Total CHOL/HDL Ratio: 3
VLDL: 10.8 mg/dL (ref 0.0–40.0)

## 2012-12-02 LAB — CBC WITH DIFFERENTIAL/PLATELET
Basophils Relative: 0.4 % (ref 0.0–3.0)
Eosinophils Relative: 1.7 % (ref 0.0–5.0)
HCT: 39.8 % (ref 36.0–46.0)
MCV: 87.4 fl (ref 78.0–100.0)
Monocytes Absolute: 0.4 10*3/uL (ref 0.1–1.0)
Monocytes Relative: 13.3 % — ABNORMAL HIGH (ref 3.0–12.0)
Neutrophils Relative %: 32.8 % — ABNORMAL LOW (ref 43.0–77.0)
Platelets: 228 10*3/uL (ref 150.0–400.0)
RBC: 4.56 Mil/uL (ref 3.87–5.11)
WBC: 3.2 10*3/uL — ABNORMAL LOW (ref 4.5–10.5)

## 2012-12-02 LAB — HEPATIC FUNCTION PANEL
ALT: 28 U/L (ref 0–35)
AST: 23 U/L (ref 0–37)
Alkaline Phosphatase: 69 U/L (ref 39–117)
Bilirubin, Direct: 0.1 mg/dL (ref 0.0–0.3)
Total Bilirubin: 0.7 mg/dL (ref 0.3–1.2)
Total Protein: 7.5 g/dL (ref 6.0–8.3)

## 2012-12-02 LAB — BASIC METABOLIC PANEL
BUN: 16 mg/dL (ref 6–23)
Chloride: 104 mEq/L (ref 96–112)
Creatinine, Ser: 0.7 mg/dL (ref 0.4–1.2)
GFR: 105.13 mL/min (ref >60.00)
Potassium: 4 mEq/L (ref 3.5–5.1)

## 2012-12-09 ENCOUNTER — Ambulatory Visit (INDEPENDENT_AMBULATORY_CARE_PROVIDER_SITE_OTHER): Payer: 59 | Admitting: Internal Medicine

## 2012-12-09 ENCOUNTER — Encounter: Payer: Self-pay | Admitting: Internal Medicine

## 2012-12-09 VITALS — BP 134/82 | HR 75 | Temp 98.0°F | Ht 64.5 in | Wt 162.0 lb

## 2012-12-09 DIAGNOSIS — IMO0002 Reserved for concepts with insufficient information to code with codable children: Secondary | ICD-10-CM

## 2012-12-09 DIAGNOSIS — Z833 Family history of diabetes mellitus: Secondary | ICD-10-CM | POA: Insufficient documentation

## 2012-12-09 DIAGNOSIS — R209 Unspecified disturbances of skin sensation: Secondary | ICD-10-CM

## 2012-12-09 DIAGNOSIS — D72819 Decreased white blood cell count, unspecified: Secondary | ICD-10-CM

## 2012-12-09 DIAGNOSIS — Z0000 Encounter for general adult medical examination without abnormal findings: Secondary | ICD-10-CM

## 2012-12-09 DIAGNOSIS — R7309 Other abnormal glucose: Secondary | ICD-10-CM

## 2012-12-09 LAB — HEMOGLOBIN A1C: Hgb A1c MFr Bld: 5.7 % (ref 4.6–6.5)

## 2012-12-09 NOTE — Progress Notes (Signed)
Chief Complaint  Patient presents with  . Annual Exam    HPI: Patient comes in today for Preventive Health Care visit  No major change in health status since last visit . Has form for  Work  Lateral right 3 toes when walking worse with cold.  Left index finger no color change no pain or burning.  Still travels a good bit . Due for mammo.  Sometimes pain base of thumb no joint stiffness fevers etc.  ROS:  GEN/ HEENT: No fever, significant weight changes sweats headaches vision problems hearing changes, CV/ PULM; No chest pain shortness of breath cough, syncope,edema  change in exercise tolerance. GI /GU: No adominal pain, vomiting, change in bowel habits. No blood in the stool. No significant GU symptoms. SKIN/HEME: ,no acute skin rashes suspicious lesions or bleeding. No lymphadenopathy, nodules, masses.  NEURO/ PSYCH:  No neurologic signs such as weakness numbness. No depression anxiety. IMM/ Allergy: No unusual infections.  Allergy .   REST of 12 system review negative except as per HPI   Past Medical History  Diagnosis Date  . Hay fever   . Fibroids     uterine  . Anemia   . IBS (irritable bowel syndrome)   . WBC decreased   . Hx of colonoscopy     utd  . Hx of varicella     as a child  . Back pain     Family History  Problem Relation Age of Onset  . Arthritis Mother   . Cancer Father     bladder  . Hyperlipidemia Father   . Migraines Sister   . Hyperlipidemia Sister     low wbc  . Dementia      older aunt uncle gm 4s and 3s    History   Social History  . Marital Status: Single    Spouse Name: N/A    Number of Children: N/A  . Years of Education: N/A   Social History Main Topics  . Smoking status: Never Smoker   . Smokeless tobacco: None  . Alcohol Use: Yes  . Drug Use: No  . Sexual Activity: None   Other Topics Concern  . 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    EXAM:  BP 134/82  Pulse 75  Temp(Src) 98 F (36.7 C) (Oral)  Ht 5' 4.5" (1.638 m)  Wt 162 lb (73.483 kg)  BMI 27.39 kg/m2  SpO2 98%  Body mass index is 27.39 kg/(m^2).  Physical Exam: Vital signs reviewed ZOX:WRUE is a well-developed well-nourished alert cooperative   female who appears her stated age in no acute distress.  HEENT: normocephalic atraumatic , Eyes: PERRL EOM's full, conjunctiva clear, Nares: paten,t no deformity discharge or tenderness., Ears: no deformity EAC's clear TMs with normal landmarks. Mouth: clear OP, no lesions, edema.  Moist mucous membranes. Dentition in adequate repair. NECK: supple without masses, thyromegaly or bruits. CHEST/PULM:  Clear to auscultation and percussion breath sounds equal no wheeze , rales or rhonchi. No chest wall deformities or tenderness. Breast: normal by inspection . No  dimpling, discharge, masses, tenderness or discharge . CV: PMI is nondisplaced, S1 S2 no gallops, murmurs, rubs. Peripheral pulses are full without delay.No JVD .  ABDOMEN: Bowel sounds normal nontender  No guard or rebound, no hepato splenomegal no CVA tenderness.  No hernia. Well healed scars  No bruits  Extremtities:  No clubbing cyanosis or edema, no acute joint swelling or redness no focal atrophy NEURO:  Oriented x3, cranial nerves 3-12 appear to be intact, no obvious focal weakness,gait within normal limits no abnormal reflexes or asymmetrical 10 gauge monofilament normal  SKIN: No acute rashes normal turgor, color, no bruising or petechiae. PSYCH: Oriented, good eye contact, no obvious depression anxiety, cognition and judgment appear normal. LN: no cervical axillary inguinal adenopathy  Lab Results  Component Value Date    WBC 3.2* 12/02/2012   HGB 13.9 12/02/2012   HCT 39.8 12/02/2012   PLT 228.0 12/02/2012   GLUCOSE 111* 12/02/2012   CHOL 188 12/02/2012   TRIG 54.0 12/02/2012   HDL 65.90 12/02/2012   LDLCALC 111* 12/02/2012   ALT 28 12/02/2012   AST 23 12/02/2012   NA 137 12/02/2012   K 4.0 12/02/2012   CL 104 12/02/2012   CREATININE 0.7 12/02/2012   BUN 16 12/02/2012   CO2 29 12/02/2012   TSH 1.15 12/02/2012   HGBA1C 5.7 12/09/2012    ASSESSMENT AND PLAN:  Discussed the following assessment and plan:  Visit for preventive health examination - Plan: Hemoglobin A1c  Other abnormal glucose - prediabetic levels - Plan: Hemoglobin A1c  Occasional numbness/prickling/tingling right mid toes - poss local compression  no ob neuropathy  LEUKOPENIA, 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  Check a1c today plans depending on results  Counseled regarding healthy nutrition, exercise, sleep, injury prevention, calcium vit d and healthy weight . Supportive shoes good fit and fu if  persistent or progressive alarm sx disc   Patient Care Team: Madelin Headings, MD as PCP - General Patient Instructions  The intermittent numbness in middle toes could be a local phenom consideration of mortons neuroma.  Advise well fitting shoes support  Cushion s .  If  persistent or progressive consider see  Ortho feet .  Blood sugar is in the prediabetic state. Track  Intake consideration of  Nutrition referral if wishes.   Get a mammogram    Neta Mends. Panosh M.D.  Health Maintenance  Topic Date Due  . Mammogram  01/02/2013  . Influenza Vaccine  08/14/2013  . Pap Smear  12/02/2013  . Tetanus/tdap  11/14/2019  . Colonoscopy  12/16/2019   Health Maintenance Review   Pre visit review using our clinic review tool, if applicable. No additional management support is  needed unless otherwise documented below in the visit note.

## 2012-12-09 NOTE — Patient Instructions (Addendum)
The intermittent numbness in middle toes could be a local phenom consideration of mortons neuroma.  Advise well fitting shoes support  Cushion s .  If  persistent or progressive consider see  Ortho feet .  Blood sugar is in the prediabetic state. Track  Intake consideration of  Nutrition referral if wishes.   Get a mammogram

## 2012-12-11 ENCOUNTER — Telehealth: Payer: Self-pay | Admitting: Internal Medicine

## 2012-12-11 ENCOUNTER — Other Ambulatory Visit: Payer: Self-pay | Admitting: Internal Medicine

## 2012-12-11 ENCOUNTER — Other Ambulatory Visit: Payer: Self-pay

## 2012-12-11 DIAGNOSIS — Z1231 Encounter for screening mammogram for malignant neoplasm of breast: Secondary | ICD-10-CM

## 2012-12-11 NOTE — Telephone Encounter (Signed)
Pt states she is going to be doing a lot of international travel starting next week for the next year and would like an rx for sleeping med. Pt was just in last week for cpe and forgot to ask. pls advise [facility] [address]

## 2012-12-14 ENCOUNTER — Other Ambulatory Visit: Payer: Self-pay | Admitting: Family Medicine

## 2012-12-14 MED ORDER — ZOLPIDEM TARTRATE 5 MG PO TABS: ORAL_TABLET | ORAL | Status: DC

## 2012-12-14 NOTE — Telephone Encounter (Signed)
Ok to give ambien 5 mg  Take 1-2 as needed for sleep and travel. Disp : 20

## 2012-12-14 NOTE — Telephone Encounter (Signed)
Left message on personally identified cell informing the pt that the rx will be called to the [facility] on [facility].

## 2012-12-15 ENCOUNTER — Encounter: Payer: Self-pay | Admitting: Family Medicine

## 2013-01-01 ENCOUNTER — Ambulatory Visit (HOSPITAL_COMMUNITY)
Admission: RE | Admit: 2013-01-01 | Discharge: 2013-01-01 | Disposition: A | Discharge status: Home / Self Care | Payer: 59 | Source: Ambulatory Visit | Attending: Internal Medicine | Admitting: Internal Medicine

## 2013-01-01 DIAGNOSIS — Z1231 Encounter for screening mammogram for malignant neoplasm of breast: Secondary | ICD-10-CM

## 2013-07-29 ENCOUNTER — Encounter: Payer: Self-pay | Admitting: Internal Medicine

## 2013-07-29 ENCOUNTER — Ambulatory Visit (INDEPENDENT_AMBULATORY_CARE_PROVIDER_SITE_OTHER): Payer: 59 | Admitting: Internal Medicine

## 2013-07-29 VITALS — BP 146/94 | Temp 97.9°F | Ht 64.25 in | Wt 159.0 lb

## 2013-07-29 DIAGNOSIS — R030 Elevated blood-pressure reading, without diagnosis of hypertension: Secondary | ICD-10-CM

## 2013-07-29 DIAGNOSIS — J310 Chronic rhinitis: Secondary | ICD-10-CM

## 2013-07-29 DIAGNOSIS — J329 Chronic sinusitis, unspecified: Secondary | ICD-10-CM

## 2013-07-29 DIAGNOSIS — R0982 Postnasal drip: Secondary | ICD-10-CM

## 2013-07-29 NOTE — Patient Instructions (Addendum)
Begin nasal cortisone   flonase or nasacort  2 sprays   Each nostril every day.  Every day   For at least 2 weeks . Can help  sinus or nasal  Congestion.  Nothing will help a cold but runs its course.  Chest is clear. Check bp readings to make sure below 140/90  Return if still elevated

## 2013-07-29 NOTE — Progress Notes (Signed)
Pre visit review using our clinic review tool, if applicable. No additional management support is needed unless otherwise documented below in the visit note.   Chief Complaint  Patient presents with  . Cough    Started over July 4th holiday.  . Sore Throat  . Nasal Congestion  . Sinus Pressure  . Sneezing    HPI: Patient comes in today for SDA for  new problem evaluation. Onset about 10 days ago ? environmental areas.    Throat scratchy off an on and post nasal drip and sniffing a lot  And  Cough when laying down  And more aggravatged.   Has tried and feels like something coming on  And travel for 2 weeks . To go to [place].   Tired.  No itching   mininmal sneezing.  Airborne no other meds .  ROS: See pertinent positives and negatives per HPI.no fever cp sob  No ear problems on last plane flight overused afrin type ns in college so uses only sparingly  Past Medical History  Diagnosis Date  . Hay fever   . Fibroids     uterine  . Anemia   . IBS (irritable bowel syndrome)   . WBC decreased   . Hx of colonoscopy     utd  . Hx of varicella     as a child  . Back pain     Family History  Problem Relation Age of Onset  . Arthritis Mother   . Cancer Father     bladder  . Hyperlipidemia Father   . Migraines Sister   . Hyperlipidemia Sister     low wbc  . Dementia      older aunt uncle gm 71s and 59s    History   Social History  . Marital Status: Married    Spouse Name: N/A    Number of Children: N/A  . Years of Education: N/A   Social History Main Topics  . Smoking status: Never Smoker   . Smokeless tobacco: None  . Alcohol Use: Yes  . Drug Use: No  . Sexual Activity: None   Other Topics Concern  . 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 zolpidem (AMBIEN) 5 MG tablet Take 1-2 tabs at night as needed for sleep and travel  . [DISCONTINUED] zolpidem (AMBIEN) 10 MG tablet Take 1 tablet (10 mg total) by mouth at bedtime as needed for sleep (with travel).    EXAM:  BP 146/94  Temp(Src) 97.9 F (36.6 C) (Oral)  Ht 5' 4.25" (1.632 m)  Wt 159 lb (72.122 kg)  BMI 27.08 kg/m2  Body mass index is 27.08 kg/(m^2).  GENERAL: vitals reviewed and listed above, alert, oriented, appears well hydrated and in no acute distress mild nasal congestion  HEENT: atraumatic, conjunctiva  clear, no obvious abnormalities on inspection of external nose and ears tms clear  nares boggy to pink red turbinates  Face non tender ( full feeling on  palpation) OP : no lesion edema or exudate ? Drainage tracts  NECK: no obvious  masses on inspection palpation  LUNGS: clear to auscultation bilaterally, no wheezes, rales or rhonchi, CV: HRRR, no clubbing cyanosis or  peripheral edema nl cap refill  MS: moves all extremities without noticeable focal  abnormality PSYCH: pleasant and cooperative, no obvious depression or anxiety  ASSESSMENT AND PLAN:  Discussed the following assessment and plan:  Post-nasal drainage - uncertain if getting viral uri on top of chr issues at this time antibiotic would nnot help options disc  add  INCS etc   Chronic rhinitis  Elevated blood pressure reading - disc recheck readings and return if persistant.   -Patient advised to return or notify health care team  if symptoms worsen ,persist or new concerns arise.  Patient Instructions  Begin nasal cortisone   flonase or nasacort  2 sprays   Each nostril every day.  Every day   For at least 2 weeks . Can help  sinus or nasal  Congestion.  Nothing will help a cold but runs its course.  Chest is clear. Check bp readings to make sure below 140/90  Return if still elevated       Mariann Laster K. Panosh M.D.

## 2013-10-06 ENCOUNTER — Telehealth: Payer: Self-pay | Admitting: Internal Medicine

## 2013-10-06 ENCOUNTER — Encounter: Payer: Self-pay | Admitting: Family Medicine

## 2013-10-06 ENCOUNTER — Ambulatory Visit (INDEPENDENT_AMBULATORY_CARE_PROVIDER_SITE_OTHER): Payer: 59 | Admitting: Family Medicine

## 2013-10-06 VITALS — BP 126/88 | HR 68 | Temp 98.3°F | Ht 64.25 in | Wt 160.0 lb

## 2013-10-06 DIAGNOSIS — R030 Elevated blood-pressure reading, without diagnosis of hypertension: Secondary | ICD-10-CM

## 2013-10-06 DIAGNOSIS — IMO0001 Reserved for inherently not codable concepts without codable children: Secondary | ICD-10-CM

## 2013-10-06 NOTE — Telephone Encounter (Signed)
Patient Information:  Caller Name: Jayme  Phone: [phone]  Patient: Stacey Young, Stacey Young  Gender: Female  DOB: 16-Apr-1955  Age: 58 Years  PCP: Shanon Ace (Family Practice)  Office Follow Up:  Does the office need to follow up with this patient?: No  Instructions For The Office: N/A   Symptoms  Reason For Call & Symptoms: When pt was in to see Dr. Regis Bill in July/she wanted her to measure her BP. Pt has not done this until recently. She has reading of 172/84; 153/87; 167/83. RN checked EPIC and per Dr. Regis Bill , pt was to return if readings continued > 140/90. Pt travels back and forth to the [place] and is due to leave on Sunday again 10/10/13. (RN scheduled appt based on travel)  Reviewed Health History In EMR: Yes  Reviewed Medications In EMR: Yes  Reviewed Allergies In EMR: Yes  Reviewed Surgeries / Procedures: N/A  Date of Onset of Symptoms: 09/29/2013  Guideline(s) Used:  High Blood Pressure  Disposition Per Guideline:   See Within 2 Weeks in Office  Reason For Disposition Reached:   BP > 140/90 and is not taking BP medications  Advice Given:  N/A  Patient Will Follow Care Advice:  YES  Appointment Scheduled:  10/06/2013 10:45:00 Appointment Scheduled Provider:  Maudie Mercury (TEXT 1st, after 20 mins can call), Jarrett Soho [facility])

## 2013-10-06 NOTE — Progress Notes (Signed)
Pre visit review using our clinic review tool, if applicable. No additional management support is needed unless otherwise documented below in the visit note. 

## 2013-10-06 NOTE — Progress Notes (Signed)
No chief complaint on file.   HPI:  ? Elevated BP: -reports told was a little up with appointment with PCP a few months ago -reports told to monitor her BP - but she did not start monitoring until the last week -values at home have been: 195-093O(671 once)/80-91 -she has had a lot going on and more stress then usual -she wonders if her cuff is wrong -denies: CP, SOB, palpitations, swelling -usually get about 4-5 hours of CV exercise per week -diet is good, low sodium -drinks 14 glasses of wine per week -FH of hypertension in her father  ROS: See pertinent positives and negatives per HPI.  Past Medical History  Diagnosis Date  . Hay fever   . Fibroids     uterine  . Anemia   . IBS (irritable bowel syndrome)   . WBC decreased   . Hx of colonoscopy     utd  . Hx of varicella     as a child  . Back pain     Past Surgical History  Procedure Laterality Date  . Bladder repair w/ cesarean section  1987  . Gallbladder surgery  1977  . Vesicovaginal fistula closure w/ tah  2003    for fibroids and endometriosis    Family History  Problem Relation Age of Onset  . Arthritis Mother   . Cancer Father     bladder  . Hyperlipidemia Father   . Migraines Sister   . Hyperlipidemia Sister     low wbc  . Dementia      older aunt uncle gm 67s and 67s    History   Social History  . Marital Status: Married    Spouse Name: N/A    Number of Children: N/A  . Years of Education: N/A   Social History Main Topics  . Smoking status: Never Smoker   . Smokeless tobacco: None  . Alcohol Use: Yes  . Drug Use: No  . Sexual Activity: None   Other Topics Concern  . 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  GENERAL: vitals reviewed and listed above, alert, oriented, appears well hydrated and in no acute distress  HEENT: atraumatic, conjunttiva clear, no obvious abnormalities on inspection of external nose and ears  NECK: no obvious masses on inspection  LUNGS: clear to auscultation bilaterally, no wheezes, rales or rhonchi, good air movement  CV: HRRR, no peripheral edema  MS: moves all extremities without noticeable abnormality  PSYCH: pleasant and cooperative, no obvious depression or anxiety  ASSESSMENT AND PLAN:  Discussed the following assessment and plan:  Elevated blood pressure  -BP mildly elevated on initial check, ok on recheck after sitting -we discussed options and she does not want to start a medication if possible -she opted for cutting back on  alcohol and follow up with cuff and log with PCP -Patient advised to return or notify a doctor immediately if symptoms worsen or persist or new concerns arise.  Patient Instructions  -try to cut back on alcohol to eliminate or definitely no more then 7 drinks in 1 week  -check BP a few times per week after sitting for 5 minutes - keep log and bring LOG AND CUFF TO APPOINTMENT        Colin Benton R.

## 2013-10-06 NOTE — Patient Instructions (Addendum)
-  try to cut back on alcohol to eliminate or definitely no more then 7 drinks in 1 week  -check BP a few times per week after sitting for 5 minutes - keep log and bring LOG AND CUFF TO APPOINTMENT

## 2013-10-12 ENCOUNTER — Encounter: Payer: Self-pay | Admitting: Internal Medicine

## 2013-11-01 ENCOUNTER — Ambulatory Visit (INDEPENDENT_AMBULATORY_CARE_PROVIDER_SITE_OTHER): Payer: 59 | Admitting: Internal Medicine

## 2013-11-01 ENCOUNTER — Encounter: Payer: Self-pay | Admitting: Internal Medicine

## 2013-11-01 VITALS — BP 150/90 | HR 72 | Temp 98.6°F | Wt 157.3 lb

## 2013-11-01 DIAGNOSIS — J040 Acute laryngitis: Secondary | ICD-10-CM

## 2013-11-01 DIAGNOSIS — R030 Elevated blood-pressure reading, without diagnosis of hypertension: Secondary | ICD-10-CM

## 2013-11-01 MED ORDER — HYDROCODONE-HOMATROPINE 5-1.5 MG/5ML PO SYRP: 5.0000 mL | ORAL_SOLUTION | ORAL | Status: DC | PRN

## 2013-11-01 NOTE — Progress Notes (Signed)
Pre visit review using our clinic review tool, if applicable. No additional management support is needed unless otherwise documented below in the visit note.  Chief Complaint  Patient presents with  . Cough    Cough has been dry until today.  Marland Kitchen No Voice  . Nasal Congestion  . Headache    HPI: Patient Stacey Young  comes in today for SDA for  new problem evaluation. Recent [place] travel  Cough this week and 3 days ago lost voice . Morphing .  And no voice and more congestion.  starined a muscle from coughing o  took decongestant  And coughing  Up some now.  Some headache worse with cough face frontal   bp up some at home see last visit with dr Maudie Mercury . bp ok .   ROS: See pertinent positives and negatives per HPI.  Past Medical History  Diagnosis Date  . Hay fever   . Fibroids     uterine  . Anemia   . IBS (irritable bowel syndrome)   . WBC decreased   . Hx of colonoscopy     utd  . Hx of varicella     as a child  . Back pain     Family History  Problem Relation Age of Onset  . Arthritis Mother   . Cancer Father     bladder  . Hyperlipidemia Father   . Migraines Sister   . Hyperlipidemia Sister     low wbc  . Dementia      older aunt uncle gm 45s and 38s    History   Social History  . Marital Status: Married    Spouse Name: N/A    Number of Children: N/A  . Years of Education: N/A   Social History Main Topics  . Smoking status: Never Smoker   . Smokeless tobacco: None  . Alcohol Use: Yes  . Drug Use: No  . Sexual Activity: None   Other Topics Concern  . 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 zolpidem  (AMBIEN) 5 MG tablet Take 1-2 tabs at night as needed for sleep and travel  . HYDROcodone-homatropine (HYCODAN) 5-1.5 MG/5ML syrup Take 5 mLs by mouth every 4 (four) hours as needed for cough. 1-2 tsp    EXAM:  BP 150/90  Pulse 72  Temp(Src) 98.6 F (37 C) (Oral)  Wt 157 lb 4.8 oz (71.351 kg)  SpO2 99%  Body mass index is 26.79 kg/(m^2).  GENERAL: vitals reviewed and listed above, alert, oriented, appears well hydrated and in no acute distress WDWN in NAD  quiet respirations; mildly congested  Moderate hoarse no stridor . Non toxic . HEENT: Normocephalic ;atraumatic , Eyes;  PERRL, EOMs  Full, lids and conjunctiva clear,,Ears: no deformities, canals nl, TM landmarks normal, Nose: no deformity or discharge but 2+ turbinates  congested;face minimally tender Mouth : OP clear without lesion or edema .mild erythema  Neck: Supple without adenopathy or masses or bruits Chest:  Clear to A&P without wheezes rales or rhonchi deep  cough when happens  CV:  S1-S2 no gallops or murmurs peripheral perfusion is normal Skin :nl perfusion and no acute rashes   PSYCH: pleasant and cooperative, no obvious depression or anxiety  ASSESSMENT AND PLAN:  Discussed the following assessment and plan:  Laryngitis - apprears viral resp infection  fluidsairway help rest and fu   Elevated blood pressure reading [place] related to [employer] think antibiotic would be helpful at this point   Expectant management. Call for alarm sx and then fu   Bring in monitor to next visit .  No cough drops cont fluids  .  -Patient advised to return or notify health care team  if symptoms worsen ,persist or new concerns arise.  Patient Instructions  Voice rest for 24- 48 hours . Fluids  Cough med for comfort . No cough  Drops  Sugar free candy ok .   Bring in your monitor  To the  Next visit check up.   Laryngitis At the top of your windpipe is your voice box. It is the source of your voice. Inside your voice box are 2  bands of muscles called vocal cords. When you breathe, your vocal cords are relaxed and open so that air can get into the lungs. When you decide to say something, these cords come together and vibrate. The sound from these vibrations goes into your throat and comes out through your mouth as sound. Laryngitis is an inflammation of the vocal cords that causes hoarseness, cough, loss of voice, sore throat, and dry throat. Laryngitis can be temporary (acute) or long-term (chronic). Most cases of acute laryngitis improve with time.Chronic laryngitis lasts for more than 3 weeks. CAUSES Laryngitis can often be related to excessive smoking, talking, or yelling, as well as inhalation of toxic fumes and allergies. Acute laryngitis is usually caused by a viral infection, vocal strain, measles or mumps, or bacterial infections. Chronic laryngitis is usually caused by vocal cord strain, vocal cord injury, postnasal drip, growths on the vocal cords, or acid reflux. SYMPTOMS   Cough.  Sore throat.  Dry throat. RISK FACTORS  Respiratory infections.  Exposure to irritating substances, such as cigarette smoke, excessive amounts of alcohol, stomach acids, and workplace chemicals.  Voice trauma, such as vocal cord injury from shouting or speaking too loud. DIAGNOSIS  Your cargiver will perform a physical exam. During the physical exam, your caregiver will examine your throat. The most common sign of laryngitis is hoarseness. Laryngoscopy may be necessary to confirm the diagnosis of this condition. This procedure allows your caregiver to look into the larynx. HOME CARE INSTRUCTIONS  Drink enough fluids to keep your urine clear or pale yellow.  Rest until you no longer have symptoms or as directed by your caregiver.  Breathe in moist air.  Take all medicine as directed by your caregiver.  Do not smoke.  Talk as little as possible (this includes whispering).  Write on paper instead of talking until your  voice is back to normal.  Follow up with your caregiver if your condition has not improved after 10 days. SEEK MEDICAL CARE IF:   You have trouble breathing.  You cough up blood.  You have persistent fever.  You have increasing pain.  You have difficulty swallowing. MAKE SURE YOU:  Understand these instructions.  Will watch your condition.  Will get help right away if you are not doing well or get worse. Document Released: 12/31/2004 Document Revised: 03/25/2011 Document Reviewed: 03/08/2010 [facility] Patient Information 2015 [place]. This information is not intended to replace advice given to you by your health care provider. Make sure you discuss any questions you have with your health care provider.      Standley Brooking. Panosh M.D.

## 2013-11-01 NOTE — Patient Instructions (Addendum)
Voice rest for 24- 48 hours . Fluids  Cough med for comfort . No cough  Drops  Sugar free candy ok .   Bring in your monitor  To the  Next visit check up.   Laryngitis At the top of your windpipe is your voice box. It is the source of your voice. Inside your voice box are 2 bands of muscles called vocal cords. When you breathe, your vocal cords are relaxed and open so that air can get into the lungs. When you decide to say something, these cords come together and vibrate. The sound from these vibrations goes into your throat and comes out through your mouth as sound. Laryngitis is an inflammation of the vocal cords that causes hoarseness, cough, loss of voice, sore throat, and dry throat. Laryngitis can be temporary (acute) or long-term (chronic). Most cases of acute laryngitis improve with time.Chronic laryngitis lasts for more than 3 weeks. CAUSES Laryngitis can often be related to excessive smoking, talking, or yelling, as well as inhalation of toxic fumes and allergies. Acute laryngitis is usually caused by a viral infection, vocal strain, measles or mumps, or bacterial infections. Chronic laryngitis is usually caused by vocal cord strain, vocal cord injury, postnasal drip, growths on the vocal cords, or acid reflux. SYMPTOMS   Cough.  Sore throat.  Dry throat. RISK FACTORS  Respiratory infections.  Exposure to irritating substances, such as cigarette smoke, excessive amounts of alcohol, stomach acids, and workplace chemicals.  Voice trauma, such as vocal cord injury from shouting or speaking too loud. DIAGNOSIS  Your cargiver will perform a physical exam. During the physical exam, your caregiver will examine your throat. The most common sign of laryngitis is hoarseness. Laryngoscopy may be necessary to confirm the diagnosis of this condition. This procedure allows your caregiver to look into the larynx. HOME CARE INSTRUCTIONS  Drink enough fluids to keep your urine clear or pale  yellow.  Rest until you no longer have symptoms or as directed by your caregiver.  Breathe in moist air.  Take all medicine as directed by your caregiver.  Do not smoke.  Talk as little as possible (this includes whispering).  Write on paper instead of talking until your voice is back to normal.  Follow up with your caregiver if your condition has not improved after 10 days. SEEK MEDICAL CARE IF:   You have trouble breathing.  You cough up blood.  You have persistent fever.  You have increasing pain.  You have difficulty swallowing. MAKE SURE YOU:  Understand these instructions.  Will watch your condition.  Will get help right away if you are not doing well or get worse. Document Released: 12/31/2004 Document Revised: 03/25/2011 Document Reviewed: 03/08/2010 [facility] Patient Information 2015 [place], [place]. This information is not intended to replace advice given to you by your health care provider. Make sure you discuss any questions you have with your health care provider.

## 2013-11-09 ENCOUNTER — Other Ambulatory Visit (INDEPENDENT_AMBULATORY_CARE_PROVIDER_SITE_OTHER): Payer: 59

## 2013-11-09 DIAGNOSIS — Z0000 Encounter for general adult medical examination without abnormal findings: Secondary | ICD-10-CM

## 2013-11-09 LAB — BASIC METABOLIC PANEL
BUN: 14 mg/dL (ref 6–23)
CO2: 25 meq/L (ref 19–32)
Calcium: 9.1 mg/dL (ref 8.4–10.5)
Chloride: 104 mEq/L (ref 96–112)
Creatinine, Ser: 0.8 mg/dL (ref 0.4–1.2)
GFR: 94.28 mL/min (ref >60.00)
Glucose, Bld: 91 mg/dL (ref 70–99)
POTASSIUM: 4.3 meq/L (ref 3.5–5.1)
SODIUM: 139 meq/L (ref 135–145)

## 2013-11-09 LAB — HEPATIC FUNCTION PANEL
ALT: 24 U/L (ref 0–35)
AST: 21 U/L (ref 0–37)
Albumin: 3.5 g/dL (ref 3.5–5.2)
Alkaline Phosphatase: 76 U/L (ref 39–117)
Bilirubin, Direct: 0 mg/dL (ref 0.0–0.3)
TOTAL PROTEIN: 7.9 g/dL (ref 6.0–8.3)
Total Bilirubin: 0.4 mg/dL (ref 0.2–1.2)

## 2013-11-09 LAB — CBC WITH DIFFERENTIAL/PLATELET
BASOS ABS: 0 10*3/uL (ref 0.0–0.1)
BASOS PCT: 0.5 % (ref 0.0–3.0)
Eosinophils Absolute: 0.1 10*3/uL (ref 0.0–0.7)
Eosinophils Relative: 2 % (ref 0.0–5.0)
HCT: 41.9 % (ref 36.0–46.0)
HEMOGLOBIN: 13.9 g/dL (ref 12.0–15.0)
Lymphocytes Relative: 45.6 % (ref 12.0–46.0)
Lymphs Abs: 1.9 10*3/uL (ref 0.7–4.0)
MCHC: 33.1 g/dL (ref 30.0–36.0)
MCV: 89.4 fl (ref 78.0–100.0)
Monocytes Absolute: 0.5 10*3/uL (ref 0.1–1.0)
Monocytes Relative: 13 % — ABNORMAL HIGH (ref 3.0–12.0)
NEUTROS ABS: 1.6 10*3/uL (ref 1.4–7.7)
Neutrophils Relative %: 38.9 % — ABNORMAL LOW (ref 43.0–77.0)
Platelets: 275 10*3/uL (ref 150.0–400.0)
RBC: 4.69 Mil/uL (ref 3.87–5.11)
RDW: 12.8 % (ref 11.5–15.5)
WBC: 4.1 10*3/uL (ref 4.0–10.5)

## 2013-11-09 LAB — LIPID PANEL
CHOLESTEROL: 184 mg/dL (ref 0–200)
HDL: 57.8 mg/dL (ref >39.00)
LDL Cholesterol: 114 mg/dL — ABNORMAL HIGH (ref 0–99)
NonHDL: 126.2
Total CHOL/HDL Ratio: 3
Triglycerides: 63 mg/dL (ref 0.0–149.0)
VLDL: 12.6 mg/dL (ref 0.0–40.0)

## 2013-11-09 LAB — TSH: TSH: 0.9 u[IU]/mL (ref 0.35–4.50)

## 2013-11-15 ENCOUNTER — Ambulatory Visit (INDEPENDENT_AMBULATORY_CARE_PROVIDER_SITE_OTHER): Payer: 59 | Admitting: Internal Medicine

## 2013-11-15 ENCOUNTER — Encounter: Payer: Self-pay | Admitting: Internal Medicine

## 2013-11-15 VITALS — BP 152/80 | Temp 98.6°F | Ht 64.25 in | Wt 159.0 lb

## 2013-11-15 DIAGNOSIS — I10 Essential (primary) hypertension: Secondary | ICD-10-CM

## 2013-11-15 DIAGNOSIS — Z833 Family history of diabetes mellitus: Secondary | ICD-10-CM

## 2013-11-15 DIAGNOSIS — Z0000 Encounter for general adult medical examination without abnormal findings: Secondary | ICD-10-CM

## 2013-11-15 MED ORDER — LOSARTAN POTASSIUM 50 MG PO TABS: 50.0000 mg | ORAL_TABLET | Freq: Every day | ORAL | Status: DC

## 2013-11-15 NOTE — Progress Notes (Signed)
Pre visit review using our clinic review tool, if applicable. No additional management support is needed unless otherwise documented below in the visit note.  Chief Complaint  Patient presents with  . Annual Exam    HPI: Patient comes in today for Preventive Health Care visit  No major change in health status since last visit . Cough is better still a bit hoarse but getting better no shortness of breath.  Health Maintenance  Topic Date Due  . PAP SMEAR  12/02/2013  . INFLUENZA VACCINE  08/15/2014  . MAMMOGRAM  01/02/2015  . TETANUS/TDAP  11/14/2019  . COLONOSCOPY  12/16/2019   Health Maintenance Review LIFESTYLE:  Exercise:  Not recently with illness Tobacco/ETS: Alcohol: per day  Sugar beverages:limits no Sleep:regular travels Drug use: no Colonoscopy: up-to-date due to 2021   ROS:  GEN/ HEENT: No fever, significant weight changes sweats headaches vision problems hearing changes, CV/ PULM; No chest pain shortness of breath  syncope,edema  change in exercise tolerance. GI /GU: No adominal pain, vomiting, change in bowel habits. No blood in the stool. No significant GU symptoms. SKIN/HEME: ,no acute skin rashes suspicious lesions or bleeding. No lymphadenopathy, nodules, masses.  NEURO/ PSYCH:  No neurologic signs such as weakness numbness. No depression anxiety. IMM/ Allergy: No unusual infections.  Allergy .   REST of 12 system review negative except as per HPI   Past Medical History  Diagnosis Date  . Hay fever   . Fibroids     uterine  . Anemia   . IBS (irritable bowel syndrome)   . WBC decreased   . Hx of colonoscopy     utd  . Hx of varicella     as a child  . Back pain    Mom history of CPAP family history of hypertension. Family History  Problem Relation Age of Onset  . Arthritis Mother   . Cancer Father     bladder  . Hyperlipidemia Father   . Migraines Sister   . Hyperlipidemia Sister     low wbc  . Dementia      older aunt uncle gm 38s and  33s  . Obstructive Sleep Apnea Mother     on CPAP    History   Social History  . Marital Status: Married    Spouse Name: N/A    Number of Children: N/A  . Years of Education: N/A   Social History Main Topics  . Smoking status: Never Smoker   . Smokeless tobacco: None  . Alcohol Use: Yes  . Drug Use: No  . Sexual Activity: None   Other Topics Concern  . 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 zolpidem (AMBIEN) 5 MG tablet Take 1-2 tabs at night as needed for sleep and travel  . losartan (COZAAR) 50 MG tablet Take 1 tablet (50 mg total) by mouth daily.  . [DISCONTINUED] HYDROcodone-homatropine (HYCODAN) 5-1.5 MG/5ML syrup Take 5 mLs by mouth every 4 (four) hours as needed for cough. 1-2 tsp    EXAM:  BP 152/80  mmHg  Temp(Src) 98.6 F (37 C) (Oral)  Ht 5' 4.25" (1.632 m)  Wt 159 lb (72.122 kg)  BMI 27.08 kg/m2  Body mass index is 27.08 kg/(m^2). Repeated blood pressures 158/80 154/8148 152. Physical Exam: Vital signs reviewed YPP:JKDT is a well-developed well-nourished alert cooperative    who appearsr stated age in no acute distress. Mildly congested voice much better no stridor HEENT: normocephalic atraumatic , Eyes: PERRL EOM's full, conjunctiva clear, Nares: paten,t no deformity discharge or tenderness., Ears: no deformity EAC's clear TMs with normal landmarks. Mouth: clear OP, no lesions, edema.  Moist mucous membranes. Dentition in adequate repair. NECK: supple without masses, thyromegaly or bruits. CHEST/PULM:  Clear to auscultation and percussion breath sounds equal no wheeze , rales or rhonchi. No chest wall deformities  or tenderness. CV: PMI is nondisplaced, S1 S2 no gallops, murmurs, rubs. Peripheral pulses are full without delay.No JVD .  ABDOMEN: Bowel sounds normal nontender  No guard or rebound, no hepato splenomegal no CVA tenderness.  No hernia. Extremtities:  No clubbing cyanosis or edema, no acute joint swelling or redness no focal atrophy NEURO:  Oriented x3, cranial nerves 3-12 appear to be intact, no obvious focal weakness,gait within normal limits no abnormal reflexes or asymmetrical SKIN: No acute rashes normal turgor, color, no bruising or petechiae. PSYCH: Oriented, good eye contact, no obvious depression anxiety, cognition and judgment appear normal. LN: no cervical axillary inguinal adenopathy  Lab Results  Component Value Date   WBC 4.1 11/09/2013   HGB 13.9 11/09/2013   HCT 41.9 11/09/2013   PLT 275.0 11/09/2013   GLUCOSE 91 11/09/2013   CHOL 184 11/09/2013   TRIG 63.0 11/09/2013   HDL 57.80 11/09/2013   LDLCALC 114* 11/09/2013   ALT 24 11/09/2013   AST 21 11/09/2013   NA 139 11/09/2013   K 4.3 11/09/2013   CL 104 11/09/2013   CREATININE 0.8 11/09/2013   BUN 14 11/09/2013   CO2 25 11/09/2013   TSH 0.90 11/09/2013   HGBA1C 5.7 12/09/2012    ASSESSMENT AND PLAN:  Discussed the following assessment and plan:  Visit for preventive health examination  Essential hypertension  Family history of diabetes mellitus Pt machine reads 150s - 170s  Add cozaar  49 and fu 2 months may need combo has some urinary frequency . Avoiding diuretics today consider ccb if needed  Counseled lifestyle intervention blood pressure control etc. Patient Care Team: Burnis Medin, MD as PCP - General Patient Instructions  bp is up and advise continuedlifestyle intervention healthy eating and exercise .  But add  Medication to control .  Return office visit in 2 months  or as needed .    DASH Eating Plan DASH stands for "Dietary Approaches to Stop Hypertension." The DASH eating plan is a  healthy eating plan that has been shown to reduce high blood pressure (hypertension). Additional health benefits may include reducing the risk of type 2 diabetes mellitus, heart disease, and stroke. The DASH eating plan may also help with weight loss. WHAT DO I NEED TO KNOW ABOUT THE DASH EATING PLAN? For the DASH eating plan, you will follow these general guidelines:  Choose foods with a percent daily value for sodium of less than 5% (as listed on the food label).  Use salt-free seasonings or herbs instead of table salt or sea salt.  Check with your health care provider or pharmacist before using salt substitutes.  Eat lower-sodium products, often labeled as "lower sodium" or "no salt  added."  Eat fresh foods.  Eat more vegetables, fruits, and low-fat dairy products.  Choose whole grains. Look for the word "whole" as the first word in the ingredient list.  Choose fish and skinless chicken or [place] more often than red meat. Limit fish, poultry, and meat to 6 oz (170 g) each day.  Limit sweets, desserts, sugars, and sugary drinks.  Choose heart-healthy fats.  Limit cheese to 1 oz (28 g) per day.  Eat more home-cooked food and less restaurant, buffet, and fast food.  Limit fried foods.  Cook foods using methods other than frying.  Limit canned vegetables. If you do use them, rinse them well to decrease the sodium.  When eating at a restaurant, ask that your food be prepared with less salt, or no salt if possible. WHAT FOODS CAN I EAT? Seek help from a dietitian for individual calorie needs. Grains Whole grain or whole wheat bread. Brown rice. Whole grain or whole wheat pasta. Quinoa, bulgur, and whole grain cereals. Low-sodium cereals. Corn or whole wheat flour tortillas. Whole grain cornbread. Whole grain crackers. Low-sodium crackers. Vegetables Fresh or frozen vegetables (raw, steamed, roasted, or grilled). Low-sodium or reduced-sodium tomato and vegetable juices. Low-sodium  or reduced-sodium tomato sauce and paste. Low-sodium or reduced-sodium canned vegetables.  Fruits All fresh, canned (in natural juice), or frozen fruits. Meat and Other Protein Products Ground beef (85% or leaner), grass-fed beef, or beef trimmed of fat. Skinless chicken or [place]. Ground chicken or [place]. Pork trimmed of fat. All fish and seafood. Eggs. Dried beans, peas, or lentils. Unsalted nuts and seeds. Unsalted canned beans. Dairy Low-fat dairy products, such as skim or 1% milk, 2% or reduced-fat cheeses, low-fat ricotta or cottage cheese, or plain low-fat yogurt. Low-sodium or reduced-sodium cheeses. Fats and Oils Tub margarines without trans fats. Light or reduced-fat mayonnaise and salad dressings (reduced sodium). Avocado. Safflower, olive, or canola oils. Natural peanut or almond butter. Other Unsalted popcorn and pretzels. The items listed above may not be a complete list of recommended foods or beverages. Contact your dietitian for more options. WHAT FOODS ARE NOT RECOMMENDED? Grains White bread. White pasta. White rice. Refined cornbread. Bagels and croissants. Crackers that contain trans fat. Vegetables Creamed or fried vegetables. Vegetables in a cheese sauce. Regular canned vegetables. Regular canned tomato sauce and paste. Regular tomato and vegetable juices. Fruits Dried fruits. Canned fruit in light or heavy syrup. Fruit juice. Meat and Other Protein Products Fatty cuts of meat. Ribs, chicken wings, bacon, sausage, bologna, salami, chitterlings, fatback, hot dogs, bratwurst, and packaged luncheon meats. Salted nuts and seeds. Canned beans with salt. Dairy Whole or 2% milk, cream, half-and-half, and cream cheese. Whole-fat or sweetened yogurt. Full-fat cheeses or blue cheese. Nondairy creamers and whipped toppings. Processed cheese, cheese spreads, or cheese curds. Condiments Onion and garlic salt, seasoned salt, table salt, and sea salt. Canned and packaged gravies.  Worcestershire sauce. Tartar sauce. Barbecue sauce. Teriyaki sauce. Soy sauce, including reduced sodium. Steak sauce. Fish sauce. Oyster sauce. Cocktail sauce. Horseradish. Ketchup and mustard. Meat flavorings and tenderizers. Bouillon cubes. Hot sauce. Tabasco sauce. Marinades. Taco seasonings. Relishes. Fats and Oils Butter, stick margarine, lard, shortening, ghee, and bacon fat. Coconut, palm kernel, or palm oils. Regular salad dressings. Other Pickles and olives. Salted popcorn and pretzels. The items listed above may not be a complete list of foods and beverages to avoid. Contact your dietitian for more information. WHERE CAN I FIND MORE INFORMATION? National Heart, Lung, and Blood Institute: travelstabloid.com Document  Released: 12/20/2010 Document Revised: 05/17/2013 Document Reviewed: 11/04/2012 [facility] Patient Information 2015 [place], [place]. This information is not intended to replace advice given to you by your health care provider. Make sure you discuss any questions you have with your health care provider.       Standley Brooking. Panosh M.D.

## 2013-11-15 NOTE — Patient Instructions (Addendum)
bp is up and advise continuedlifestyle intervention healthy eating and exercise .  But add  Medication to control .  Return office visit in 2 months  or as needed .    DASH Eating Plan DASH stands for "Dietary Approaches to Stop Hypertension." The DASH eating plan is a healthy eating plan that has been shown to reduce high blood pressure (hypertension). Additional health benefits may include reducing the risk of type 2 diabetes mellitus, heart disease, and stroke. The DASH eating plan may also help with weight loss. WHAT DO I NEED TO KNOW ABOUT THE DASH EATING PLAN? For the DASH eating plan, you will follow these general guidelines:  Choose foods with a percent daily value for sodium of less than 5% (as listed on the food label).  Use salt-free seasonings or herbs instead of table salt or sea salt.  Check with your health care provider or pharmacist before using salt substitutes.  Eat lower-sodium products, often labeled as "lower sodium" or "no salt added."  Eat fresh foods.  Eat more vegetables, fruits, and low-fat dairy products.  Choose whole grains. Look for the word "whole" as the first word in the ingredient list.  Choose fish and skinless chicken or [place] more often than red meat. Limit fish, poultry, and meat to 6 oz (170 g) each day.  Limit sweets, desserts, sugars, and sugary drinks.  Choose heart-healthy fats.  Limit cheese to 1 oz (28 g) per day.  Eat more home-cooked food and less restaurant, buffet, and fast food.  Limit fried foods.  Cook foods using methods other than frying.  Limit canned vegetables. If you do use them, rinse them well to decrease the sodium.  When eating at a restaurant, ask that your food be prepared with less salt, or no salt if possible. WHAT FOODS CAN I EAT? Seek help from a dietitian for individual calorie needs. Grains Whole grain or whole wheat bread. Brown rice. Whole grain or whole wheat pasta. Quinoa, bulgur, and whole grain  cereals. Low-sodium cereals. Corn or whole wheat flour tortillas. Whole grain cornbread. Whole grain crackers. Low-sodium crackers. Vegetables Fresh or frozen vegetables (raw, steamed, roasted, or grilled). Low-sodium or reduced-sodium tomato and vegetable juices. Low-sodium or reduced-sodium tomato sauce and paste. Low-sodium or reduced-sodium canned vegetables.  Fruits All fresh, canned (in natural juice), or frozen fruits. Meat and Other Protein Products Ground beef (85% or leaner), grass-fed beef, or beef trimmed of fat. Skinless chicken or [place]. Ground chicken or [place]. Pork trimmed of fat. All fish and seafood. Eggs. Dried beans, peas, or lentils. Unsalted nuts and seeds. Unsalted canned beans. Dairy Low-fat dairy products, such as skim or 1% milk, 2% or reduced-fat cheeses, low-fat ricotta or cottage cheese, or plain low-fat yogurt. Low-sodium or reduced-sodium cheeses. Fats and Oils Tub margarines without trans fats. Light or reduced-fat mayonnaise and salad dressings (reduced sodium). Avocado. Safflower, olive, or canola oils. Natural peanut or almond butter. Other Unsalted popcorn and pretzels. The items listed above may not be a complete list of recommended foods or beverages. Contact your dietitian for more options. WHAT FOODS ARE NOT RECOMMENDED? Grains White bread. White pasta. White rice. Refined cornbread. Bagels and croissants. Crackers that contain trans fat. Vegetables Creamed or fried vegetables. Vegetables in a cheese sauce. Regular canned vegetables. Regular canned tomato sauce and paste. Regular tomato and vegetable juices. Fruits Dried fruits. Canned fruit in light or heavy syrup. Fruit juice. Meat and Other Protein Products Fatty cuts of meat. Ribs, chicken wings, bacon,  sausage, bologna, salami, chitterlings, fatback, hot dogs, bratwurst, and packaged luncheon meats. Salted nuts and seeds. Canned beans with salt. Dairy Whole or 2% milk, cream, half-and-half, and  cream cheese. Whole-fat or sweetened yogurt. Full-fat cheeses or blue cheese. Nondairy creamers and whipped toppings. Processed cheese, cheese spreads, or cheese curds. Condiments Onion and garlic salt, seasoned salt, table salt, and sea salt. Canned and packaged gravies. Worcestershire sauce. Tartar sauce. Barbecue sauce. Teriyaki sauce. Soy sauce, including reduced sodium. Steak sauce. Fish sauce. Oyster sauce. Cocktail sauce. Horseradish. Ketchup and mustard. Meat flavorings and tenderizers. Bouillon cubes. Hot sauce. Tabasco sauce. Marinades. Taco seasonings. Relishes. Fats and Oils Butter, stick margarine, lard, shortening, ghee, and bacon fat. Coconut, palm kernel, or palm oils. Regular salad dressings. Other Pickles and olives. Salted popcorn and pretzels. The items listed above may not be a complete list of foods and beverages to avoid. Contact your dietitian for more information. WHERE CAN I FIND MORE INFORMATION? National Heart, Lung, and Blood Institute: travelstabloid.com Document Released: 12/20/2010 Document Revised: 05/17/2013 Document Reviewed: 11/04/2012 [facility] Patient Information 2015 [place], [place]. This information is not intended to replace advice given to you by your health care provider. Make sure you discuss any questions you have with your health care provider.

## 2013-11-16 ENCOUNTER — Telehealth: Payer: Self-pay | Admitting: Internal Medicine

## 2013-11-16 NOTE — Telephone Encounter (Signed)
emmi emailed °

## 2013-11-19 ENCOUNTER — Encounter: Payer: Self-pay | Admitting: Internal Medicine

## 2013-11-19 DIAGNOSIS — I10 Essential (primary) hypertension: Secondary | ICD-10-CM | POA: Insufficient documentation

## 2013-11-19 NOTE — Assessment & Plan Note (Signed)
See above add Cozaar 50 mg follow-up 2 months currently avoiding diuretics because of symptom complex. Patient's machine is adequate comparison to office machine.

## 2014-01-17 ENCOUNTER — Other Ambulatory Visit: Payer: Self-pay | Admitting: Internal Medicine

## 2014-01-17 DIAGNOSIS — Z1231 Encounter for screening mammogram for malignant neoplasm of breast: Secondary | ICD-10-CM

## 2014-01-20 ENCOUNTER — Ambulatory Visit (INDEPENDENT_AMBULATORY_CARE_PROVIDER_SITE_OTHER): Payer: 59 | Admitting: Internal Medicine

## 2014-01-20 ENCOUNTER — Encounter: Payer: Self-pay | Admitting: Internal Medicine

## 2014-01-20 VITALS — BP 136/86 | Temp 97.9°F | Ht 64.25 in | Wt 158.4 lb

## 2014-01-20 DIAGNOSIS — I10 Essential (primary) hypertension: Secondary | ICD-10-CM

## 2014-01-20 LAB — BASIC METABOLIC PANEL
BUN: 18 mg/dL (ref 6–23)
CALCIUM: 9.1 mg/dL (ref 8.4–10.5)
CHLORIDE: 104 meq/L (ref 96–112)
CO2: 24 meq/L (ref 19–32)
Creatinine, Ser: 0.7 mg/dL (ref 0.4–1.2)
GFR: 106.4 mL/min (ref >60.00)
Glucose, Bld: 102 mg/dL — ABNORMAL HIGH (ref 70–99)
POTASSIUM: 4.4 meq/L (ref 3.5–5.1)
Sodium: 137 mEq/L (ref 135–145)

## 2014-01-20 MED ORDER — LOSARTAN POTASSIUM 50 MG PO TABS: 50.0000 mg | ORAL_TABLET | Freq: Every day | ORAL | Status: DC

## 2014-01-20 NOTE — Patient Instructions (Signed)
Continue lifestyle intervention healthy eating and exercise . Same med  Goal is below 140/90    . BP  Will notify you  of labs when available.  If all ok then yearly wellness visit .

## 2014-01-20 NOTE — Progress Notes (Signed)
Pre visit review using our clinic review tool, if applicable. No additional management support is needed unless otherwise documented below in the visit note.  Chief Complaint  Patient presents with  . Follow-up    HPI: Stacey Young 59 y.o. fu BP   Usually below 80 /140 and lower  Feels fine no se of med taking losar qd  ROS: See pertinent positives and negatives per HPI.  Past Medical History  Diagnosis Date  . Hay fever   . Fibroids     uterine  . Anemia   . IBS (irritable bowel syndrome)   . WBC decreased   . Hx of colonoscopy     utd  . Hx of varicella     as a child  . Back pain     Family History  Problem Relation Age of Onset  . Arthritis Mother   . Cancer Father     bladder  . Hyperlipidemia Father   . Migraines Sister   . Hyperlipidemia Sister     low wbc  . Dementia      older aunt uncle gm 59s and 33s  . Obstructive Sleep Apnea Mother     on CPAP    History   Social History  . Marital Status: Married    Spouse Name: N/A    Number of Children: N/A  . Years of Education: N/A   Social History Main Topics  . Smoking status: Never Smoker   . Smokeless tobacco: None  . Alcohol Use: Yes  . Drug Use: No  . Sexual Activity: None   Other Topics Concern  . 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 losartan (COZAAR) 50 MG tablet Take 1 tablet (50 mg total) by mouth daily.  Marland Kitchen zolpidem (AMBIEN) 5 MG tablet Take 1-2 tabs at night as needed for sleep and travel  . [DISCONTINUED] losartan (COZAAR) 50 MG tablet Take 1 tablet (50 mg total) by mouth daily.    EXAM:  BP 136/86 mmHg  Temp(Src) 97.9 F (36.6  C) (Oral)  Ht 5' 4.25" (1.632 m)  Wt 158 lb 6.4 oz (71.85 kg)  BMI 26.98 kg/m2  Body mass index is 26.98 kg/(m^2). Repeat bp 136/86 right GENERAL: vitals reviewed and listed above, alert, oriented, appears well hydrated and in no acute distress PSYCH: pleasant and cooperative, no obvious depression or anxiety BP Readings from Last 3 Encounters:  01/20/14 136/86  11/15/13 152/80  11/01/13 150/90   Wt Readings from Last 3 Encounters:  01/20/14 158 lb 6.4 oz (71.85 kg)  11/15/13 159 lb (72.122 kg)  11/01/13 157 lb 4.8 oz (71.351 kg)      ASSESSMENT AND PLAN:  Discussed the following assessment and plan:  Essential hypertension - Plan: Basic metabolic panel Plan bmp  Continue if controlled  -Patient advised to return or notify health care team  if symptoms worsen ,persist or new concerns arise.  Patient Instructions  Continue lifestyle intervention healthy eating and exercise . Same med  Goal is below 140/90    .  BP  Will notify you  of labs when available.  If all ok then yearly wellness visit .    Standley Brooking. Vernon Maish M.D.

## 2014-02-01 ENCOUNTER — Telehealth: Payer: Self-pay

## 2014-02-01 MED ORDER — LOSARTAN POTASSIUM 50 MG PO TABS: 50.0000 mg | ORAL_TABLET | Freq: Every day | ORAL | Status: DC

## 2014-02-01 NOTE — Telephone Encounter (Signed)
Will route to Dr. Regis Bill as this is not a Dr. Lenna Gilford patient.

## 2014-02-01 NOTE — Telephone Encounter (Signed)
Rx request for Sertraline HCL 50 mg tablet- Take 1 tablet daily #90  Pharm:  [facility]  Pls advise.

## 2014-02-01 NOTE — Addendum Note (Signed)
Addended by: Colleen Can on: 02/01/2014 04:13 PM   Modules accepted: Orders

## 2014-02-03 MED ORDER — SERTRALINE HCL 50 MG PO TABS: 50.0000 mg | ORAL_TABLET | Freq: Every day | ORAL | Status: DC

## 2014-02-03 NOTE — Telephone Encounter (Signed)
Sent to the pharmacy by e-scribe. 

## 2014-02-03 NOTE — Telephone Encounter (Signed)
Ok to refill x 1 year 

## 2014-02-04 ENCOUNTER — Ambulatory Visit (HOSPITAL_COMMUNITY): Admission: RE | Admit: 2014-02-04 | Payer: 59 | Source: Ambulatory Visit

## 2014-04-11 ENCOUNTER — Telehealth: Payer: Self-pay | Admitting: Internal Medicine

## 2014-04-11 ENCOUNTER — Telehealth: Payer: Self-pay | Admitting: Family Medicine

## 2014-04-11 MED ORDER — ZOLPIDEM TARTRATE 5 MG PO TABS: ORAL_TABLET | ORAL | Status: DC

## 2014-04-11 MED ORDER — LOSARTAN POTASSIUM 100 MG PO TABS: 100.0000 mg | ORAL_TABLET | Freq: Every day | ORAL | Status: DC

## 2014-04-11 NOTE — Telephone Encounter (Signed)
Ambien called to the pharmacy and left on machine.  Losartan sent by e-scribe.

## 2014-04-11 NOTE — Telephone Encounter (Signed)
lmom for pt to cb

## 2014-04-11 NOTE — Telephone Encounter (Signed)
Increase losartan to 100 mg per day disp 90 refill x 1  ROV in 2 months  Ok to refill ambien x 1 disp  20

## 2014-04-11 NOTE — Telephone Encounter (Signed)
Pt states her bp med losartan (COZAAR) 50 MG tablet doesn't seem to be working.  BP still running high. Doesn't want too refill until she speaks w/ dr panosh/ can only come in Friday, it i

## 2014-04-11 NOTE — Telephone Encounter (Signed)
Pt request refill  zolpidem (AMBIEN) 5 MG tablet [facility] [address]

## 2014-04-11 NOTE — Telephone Encounter (Signed)
Pt needs to be seen in 2 months for the increase her in losartan from 50 mg to 100 mg.  Please help the pt make an appt.  Thanks!

## 2014-04-13 ENCOUNTER — Other Ambulatory Visit: Payer: Self-pay | Admitting: Internal Medicine

## 2014-04-13 DIAGNOSIS — Z1231 Encounter for screening mammogram for malignant neoplasm of breast: Secondary | ICD-10-CM

## 2014-04-13 NOTE — Telephone Encounter (Signed)
Pt has been scheduled.  °

## 2014-04-25 ENCOUNTER — Ambulatory Visit (HOSPITAL_COMMUNITY)
Admission: RE | Admit: 2014-04-25 | Discharge: 2014-04-25 | Disposition: A | Discharge status: Home / Self Care | Payer: 59 | Source: Ambulatory Visit | Attending: Internal Medicine | Admitting: Internal Medicine

## 2014-04-25 DIAGNOSIS — Z1231 Encounter for screening mammogram for malignant neoplasm of breast: Secondary | ICD-10-CM | POA: Diagnosis not present

## 2014-04-28 ENCOUNTER — Telehealth: Payer: Self-pay | Admitting: Internal Medicine

## 2014-04-28 NOTE — Telephone Encounter (Signed)
Patient has switched to [facility] and need losartan (COZAAR) 100 MG tablet sent to them.  She also states she is no longer taking losartan 50 mg and need it removed from her current med list.

## 2014-04-29 MED ORDER — LOSARTAN POTASSIUM 100 MG PO TABS: 100.0000 mg | ORAL_TABLET | Freq: Every day | ORAL | Status: DC

## 2014-04-29 NOTE — Telephone Encounter (Signed)
Sent to the pharmacy by e-scribe. 

## 2014-06-14 ENCOUNTER — Ambulatory Visit (INDEPENDENT_AMBULATORY_CARE_PROVIDER_SITE_OTHER): Payer: 59 | Admitting: Internal Medicine

## 2014-06-14 ENCOUNTER — Encounter: Payer: Self-pay | Admitting: Internal Medicine

## 2014-06-14 VITALS — BP 140/92 | Temp 98.6°F | Ht 64.25 in | Wt 161.7 lb

## 2014-06-14 DIAGNOSIS — Z23 Encounter for immunization: Secondary | ICD-10-CM

## 2014-06-14 DIAGNOSIS — M25641 Stiffness of right hand, not elsewhere classified: Secondary | ICD-10-CM

## 2014-06-14 DIAGNOSIS — Z79899 Other long term (current) drug therapy: Secondary | ICD-10-CM | POA: Diagnosis not present

## 2014-06-14 DIAGNOSIS — I10 Essential (primary) hypertension: Secondary | ICD-10-CM

## 2014-06-14 MED ORDER — LOSARTAN POTASSIUM-HCTZ 100-12.5 MG PO TABS: 1.0000 | ORAL_TABLET | Freq: Every day | ORAL | Status: DC

## 2014-06-14 NOTE — Progress Notes (Signed)
Pre visit review using our clinic review tool, if applicable. No additional management support is needed unless otherwise documented below in the visit note.  Chief Complaint  Patient presents with  . Follow-up  . Hypertension    HPI: Stacey Young 59 y.o.  comes in for chronic disease/ medication management  HT increased dose to 100 mgd since march  blood pressure readings have been up and down but more recently and been more up in the 140+ range and diastolics in the high 38S and up no side effect of medicine 100 mg losartan. Medication  Right hand hard to close awakening . Achy  Hand stiffness.  Feels stiff. For the past week no trauma no redness or warmth other insight hurts ROS: See pertinent positives and negatives per HPI. No chest pain shortness of breath syncope. Has had some issue with a hemorrhoid itching irritating no bleeding asks about medication interactions. One small area may have dropped down Due for shingles injection today  Past Medical History  Diagnosis Date  . Hay fever   . Fibroids     uterine  . Anemia   . IBS (irritable bowel syndrome)   . WBC decreased   . Hx of colonoscopy     utd  . Hx of varicella     as a child  . Back pain     Family History  Problem Relation Age of Onset  . Arthritis Mother   . Cancer Father     bladder  . Hyperlipidemia Father   . Migraines Sister   . Hyperlipidemia Sister     low wbc  . Dementia      older aunt uncle gm 72s and 79s  . Obstructive Sleep Apnea Mother     on CPAP    History   Social History  . Marital Status: Married    Spouse Name: N/A  . Number of Children: N/A  . Years of Education: N/A   Social History Main Topics  . Smoking status: Never Smoker   . Smokeless tobacco: Not on file  . Alcohol Use: Yes  . Drug Use: No  . Sexual Activity: Not on file   Other Topics Concern  . 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 losartan (COZAAR) 100 MG tablet Take 1 tablet (100 mg total) by mouth daily. 90 tablet 1  . zolpidem (AMBIEN) 5 MG tablet Take 1-2 tabs at night as needed for sleep and travel 20 tablet 0  . sertraline (ZOLOFT) 50 MG tablet Take 1 tablet (50 mg total) by mouth daily. 90 tablet 3   No facility-administered medications prior to visit.     EXAM:  BP 140/92 mmHg  Temp(Src) 98.6 F (37 C) (Oral)  Ht 5' 4.25" (1.632 m)  Wt 161 lb 11.2 oz (73.347 kg)  BMI 27.54 kg/m2  Body mass index is 27.54 kg/(m^2).  GENERAL: vitals reviewed and listed above, alert, oriented, appears well hydrated and in no acute distress HEENT: atraumatic, conjunctiva  clear, no obvious abnormalities on  inspection of external nose and ears OP : no lesion edema or exudate  NECK: no obvious masses on inspection palpation  LUNGS: clear to auscultation bilaterally, no wheezes, rales or rhonchi, good air movement CV: HRRR, no clubbing cyanosis or  peripheral edema nl cap refill  MS: moves all extremities without noticeable focal  abnormality PSYCH: pleasant and cooperative, no obvious depression or anxiety BP Readings from Last 3 Encounters:  06/14/14 140/92  01/20/14 136/86  11/15/13 152/80   Wt Readings from Last 3 Encounters:  06/14/14 161 lb 11.2 oz (73.347 kg)  01/20/14 158 lb 6.4 oz (71.85 kg)  11/15/13 159 lb (72.122 kg)   Lab Results  Component Value Date   WBC 4.1 11/09/2013   HGB 13.9 11/09/2013   HCT 41.9 11/09/2013   PLT 275.0 11/09/2013   GLUCOSE 102* 01/20/2014   CHOL 184 11/09/2013   TRIG 63.0 11/09/2013   HDL 57.80 11/09/2013   LDLCALC 114* 11/09/2013   ALT 24 11/09/2013   AST 21 11/09/2013   NA 137  01/20/2014   K 4.4 01/20/2014   CL 104 01/20/2014   CREATININE 0.7 01/20/2014   BUN 18 01/20/2014   CO2 24 01/20/2014   TSH 0.90 11/09/2013   HGBA1C 5.7 12/09/2012   blood pressure log reviewed. Wt Readings from Last 3 Encounters:  06/14/14 161 lb 11.2 oz (73.347 kg)  01/20/14 158 lb 6.4 oz (71.85 kg)  11/15/13 159 lb (72.122 kg)     ASSESSMENT AND PLAN:  Discussed the following assessment and plan:  Essential hypertension - not controlled currently some ok   machine correlates   Medication management  Need for shingles vaccine - Plan: Varicella-zoster vaccine subcutaneous  Morning joint stiffness of hand, right - New onset for about a week no acute findings on exam we'll follow if persistent progressive to further workup. Intensify therapy add low-dose diuretic to our. Increase dosing as appropriate. Are OV in a few months BMP before visit. Contact us in the meantime if needed. Reviewed expectant management with hemorrhoids follow-up with alarm features. Questions answered to the best of my ability. -Patient advised to return or notify health care team  if symptoms worsen ,persist or new concerns arise.  Patient Instructions  Continue monitoring bp readings . Your monitor  Intensify med adding  Low dose diuretic to mix .  Change to combo  Losartan and low dose diuretic . Contact us about bp readings in 2 months . Plan recheck bmp  In 1-2 months.      Standley Brooking. Kennie Karapetian M.D.

## 2014-06-14 NOTE — Patient Instructions (Addendum)
Continue monitoring bp readings . Your monitor  Intensify med adding  Low dose diuretic to mix .  Change to combo  Losartan and low dose diuretic . Contact us about bp readings in 2 months . Plan recheck bmp  In 1-2 months.

## 2014-07-22 ENCOUNTER — Encounter: Payer: Self-pay | Admitting: Internal Medicine

## 2014-07-22 ENCOUNTER — Ambulatory Visit (INDEPENDENT_AMBULATORY_CARE_PROVIDER_SITE_OTHER): Payer: 59 | Admitting: Internal Medicine

## 2014-07-22 VITALS — BP 146/80 | Temp 97.7°F | Wt 157.5 lb

## 2014-07-22 DIAGNOSIS — J069 Acute upper respiratory infection, unspecified: Secondary | ICD-10-CM

## 2014-07-22 NOTE — Patient Instructions (Signed)
This is a viral  Cautious use of afrin . Cough could get worse .  before better  Contact. if fever or severe pain.  Otherwise sx can last 1-2 weeks altogether .    Upper Respiratory Infection, Adult An upper respiratory infection (URI) is also sometimes known as the common cold. The upper respiratory tract includes the nose, sinuses, throat, trachea, and bronchi. Bronchi are the airways leading to the lungs. Most people improve within 1 week, but symptoms can last up to 2 weeks. A residual cough may last even longer.  CAUSES Many different viruses can infect the tissues lining the upper respiratory tract. The tissues become irritated and inflamed and often become very moist. Mucus production is also common. A cold is contagious. You can easily spread the virus to others by oral contact. This includes kissing, sharing a glass, coughing, or sneezing. Touching your mouth or nose and then touching a surface, which is then touched by another person, can also spread the virus. SYMPTOMS  Symptoms typically develop 1 to 3 days after you come in contact with a cold virus. Symptoms vary from person to person. They may include:  Runny nose.  Sneezing.  Nasal congestion.  Sinus irritation.  Sore throat.  Loss of voice (laryngitis).  Cough.  Fatigue.  Muscle aches.  Loss of appetite.  Headache.  Low-grade fever. DIAGNOSIS  You might diagnose your own cold based on familiar symptoms, since most people get a cold 2 to 3 times a year. Your caregiver can confirm this based on your exam. Most importantly, your caregiver can check that your symptoms are not due to another disease such as strep throat, sinusitis, pneumonia, asthma, or epiglottitis. Blood tests, throat tests, and X-rays are not necessary to diagnose a common cold, but they may sometimes be helpful in excluding other more serious diseases. Your caregiver will decide if any further tests are required. RISKS AND COMPLICATIONS  You may  be at risk for a more severe case of the common cold if you smoke cigarettes, have chronic heart disease (such as heart failure) or lung disease (such as asthma), or if you have a weakened immune system. The very young and very old are also at risk for more serious infections. Bacterial sinusitis, middle ear infections, and bacterial pneumonia can complicate the common cold. The common cold can worsen asthma and chronic obstructive pulmonary disease (COPD). Sometimes, these complications can require emergency medical care and may be life-threatening. PREVENTION  The best way to protect against getting a cold is to practice good hygiene. Avoid oral or hand contact with people with cold symptoms. Wash your hands often if contact occurs. There is no clear evidence that vitamin C, vitamin E, echinacea, or exercise reduces the chance of developing a cold. However, it is always recommended to get plenty of rest and practice good nutrition. TREATMENT  Treatment is directed at relieving symptoms. There is no cure. Antibiotics are not effective, because the infection is caused by a virus, not by bacteria. Treatment may include:  Increased fluid intake. Sports drinks offer valuable electrolytes, sugars, and fluids.  Breathing heated mist or steam (vaporizer or shower).  Eating chicken soup or other clear broths, and maintaining good nutrition.  Getting plenty of rest.  Using gargles or lozenges for comfort.  Controlling fevers with ibuprofen or acetaminophen as directed by your caregiver.  Increasing usage of your inhaler if you have asthma. Zinc gel and zinc lozenges, taken in the first 24 hours of the common  cold, can shorten the duration and lessen the severity of symptoms. Pain medicines may help with fever, muscle aches, and throat pain. A variety of non-prescription medicines are available to treat congestion and runny nose. Your caregiver can make recommendations and may suggest nasal or lung  inhalers for other symptoms.  HOME CARE INSTRUCTIONS   Only take over-the-counter or prescription medicines for pain, discomfort, or fever as directed by your caregiver.  Use a warm mist humidifier or inhale steam from a shower to increase air moisture. This may keep secretions moist and make it easier to breathe.  Drink enough water and fluids to keep your urine clear or pale yellow.  Rest as needed.  Return to work when your temperature has returned to normal or as your caregiver advises. You may need to stay home longer to avoid infecting others. You can also use a face mask and careful hand washing to prevent spread of the virus. SEEK MEDICAL CARE IF:   After the first few days, you feel you are getting worse rather than better.  You need your caregiver's advice about medicines to control symptoms.  You develop chills, worsening shortness of breath, or brown or red sputum. These may be signs of pneumonia.  You develop yellow or brown nasal discharge or pain in the face, especially when you bend forward. These may be signs of sinusitis.  You develop a fever, swollen neck glands, pain with swallowing, or white areas in the back of your throat. These may be signs of strep throat. SEEK IMMEDIATE MEDICAL CARE IF:   You have a fever.  You develop severe or persistent headache, ear pain, sinus pain, or chest pain.  You develop wheezing, a prolonged cough, cough up blood, or have a change in your usual mucus (if you have chronic lung disease).  You develop sore muscles or a stiff neck. Document Released: 06/26/2000 Document Revised: 03/25/2011 Document Reviewed: 04/07/2013 [facility] Patient Information 2015 [place], [place]. This information is not intended to replace advice given to you by your health care provider. Make sure you discuss any questions you have with your health care provider.

## 2014-07-22 NOTE — Progress Notes (Signed)
Pre visit review using our clinic review tool, if applicable. No additional management support is needed unless otherwise documented below in the visit note.  Chief Complaint  Patient presents with  . Nasal Congestion    Ongoing 4-5 days.  Has taken generic Dayquil.  . Cough    HPI: Patient Stacey Young  comes in today for SDA for  new problem evaluation. Sx for 6 days    Has bad head congestion.  exsposed to URI  Husband  Got 3 days illness.     Has to go to party . In [place]  ? Communicable.  No fever chills sob very nasally clogged  dayquil nyquil   ROS: See pertinent positives and negatives per HPI.  Past Medical History  Diagnosis Date  . Hay fever   . Fibroids     uterine  . Anemia   . IBS (irritable bowel syndrome)   . WBC decreased   . Hx of colonoscopy     utd  . Hx of varicella     as a child  . Back pain     Family History  Problem Relation Age of Onset  . Arthritis Mother   . Cancer Father     bladder  . Hyperlipidemia Father   . Migraines Sister   . Hyperlipidemia Sister     low wbc  . Dementia      older aunt uncle gm 29s and 40s  . Obstructive Sleep Apnea Mother     on CPAP    History   Social History  . Marital Status: Married    Spouse Name: N/A  . Number of Children: N/A  . Years of Education: N/A   Social History Main Topics  . Smoking status: Never Smoker   . Smokeless tobacco: Not on file  . Alcohol Use: Yes  . Drug Use: No  . Sexual Activity: Not on file   Other Topics Concern  . 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 losartan-hydrochlorothiazide (HYZAAR) 100-12.5 MG per  tablet Take 1 tablet by mouth daily. 90 tablet 1  . zolpidem (AMBIEN) 5 MG tablet Take 1-2 tabs at night as needed for sleep and travel (Patient not taking: Reported on 07/22/2014) 20 tablet 0  . losartan (COZAAR) 100 MG tablet Take 1 tablet (100 mg total) by mouth daily. 90 tablet 1   No facility-administered medications prior to visit.     EXAM:  BP 146/80 mmHg  Temp(Src) 97.7 F (36.5 C) (Oral)  Wt 157 lb 8 oz (71.442 kg)  Body mass index is 26.82 kg/(m^2). WDWN in NAD  quiet respirations; quite nasally congested  somewhat hoarse. Non toxic . HEENT: Normocephalic ;atraumatic , Eyes;  PERRL, EOMs  Full, lids and conjunctiva clear,,Ears: no deformities, canals nl, TM landmarks normal, Nose: no deformity or discharge but 3+ turbinates congested;face non tender Mouth : OP clear without lesion or edema . Neck: Supple without adenopathy or masses or bruits Chest:  Clear to A&P without wheezes rales or rhonchi  CV:  S1-S2 no gallops or murmurs peripheral perfusion is normal Skin :nl perfusion and no acute rashes  PSYCH: pleasant and cooperative, no obvious depression or anxiety  ASSESSMENT AND PLAN:  Discussed the following assessment and plan:  URI, acute - viral expectant managmentt recheck for alarm features   -Patient advised to return or notify health care team  if symptoms worsen ,persist or new concerns arise.  Patient Instructions  This is a viral  Cautious use of afrin . Cough could get worse .  before better  Contact. if fever or severe pain.  Otherwise sx can last 1-2 weeks altogether .    Upper Respiratory Infection, Adult An upper respiratory infection (URI) is also sometimes known as the common cold. The upper respiratory tract includes the nose, sinuses, throat, trachea, and bronchi. Bronchi are the airways leading to the lungs. Most people improve within 1 week, but symptoms can last up to 2 weeks. A residual cough may last even longer.  CAUSES Many different  viruses can infect the tissues lining the upper respiratory tract. The tissues become irritated and inflamed and often become very moist. Mucus production is also common. A cold is contagious. You can easily spread the virus to others by oral contact. This includes kissing, sharing a glass, coughing, or sneezing. Touching your mouth or nose and then touching a surface, which is then touched by another person, can also spread the virus. SYMPTOMS  Symptoms typically develop 1 to 3 days after you come in contact with a cold virus. Symptoms vary from person to person. They may include:  Runny nose.  Sneezing.  Nasal congestion.  Sinus irritation.  Sore throat.  Loss of voice (laryngitis).  Cough.  Fatigue.  Muscle aches.  Loss of appetite.  Headache.  Low-grade fever. DIAGNOSIS  You might diagnose your own cold based on familiar symptoms, since most people get a cold 2 to 3 times a year. Your caregiver can confirm this based on your exam. Most importantly, your caregiver can check that your symptoms are not due to another disease such as strep throat, sinusitis, pneumonia, asthma, or epiglottitis. Blood tests, throat tests, and X-rays are not necessary to diagnose a common cold, but they may sometimes be helpful in excluding other more serious diseases. Your caregiver will decide if any further tests are required. RISKS AND COMPLICATIONS  You may be at risk for a more severe case of the common cold if you smoke cigarettes, have chronic heart disease (such as heart failure) or lung disease (such as asthma), or if you have a weakened immune system. The very young and very old are also at risk for more serious infections. Bacterial sinusitis, middle ear infections, and bacterial pneumonia can complicate the common cold. The common cold can worsen asthma and chronic obstructive pulmonary disease (COPD). Sometimes, these complications can require emergency medical care and may be  life-threatening. PREVENTION  The best way to protect against getting a cold is to practice good hygiene. Avoid oral or hand contact with people with cold symptoms. Wash your hands often if contact occurs. There is no clear evidence that vitamin C, vitamin E, echinacea, or exercise reduces the chance of developing a cold. However, it is always recommended to get plenty of rest and practice good nutrition. TREATMENT  Treatment is directed at relieving symptoms. There is no cure. Antibiotics are not effective, because the infection is caused by a virus, not by bacteria. Treatment may include:  Increased fluid intake. Sports drinks  offer valuable electrolytes, sugars, and fluids.  Breathing heated mist or steam (vaporizer or shower).  Eating chicken soup or other clear broths, and maintaining good nutrition.  Getting plenty of rest.  Using gargles or lozenges for comfort.  Controlling fevers with ibuprofen or acetaminophen as directed by your caregiver.  Increasing usage of your inhaler if you have asthma. Zinc gel and zinc lozenges, taken in the first 24 hours of the common cold, can shorten the duration and lessen the severity of symptoms. Pain medicines may help with fever, muscle aches, and throat pain. A variety of non-prescription medicines are available to treat congestion and runny nose. Your caregiver can make recommendations and may suggest nasal or lung inhalers for other symptoms.  HOME CARE INSTRUCTIONS   Only take over-the-counter or prescription medicines for pain, discomfort, or fever as directed by your caregiver.  Use a warm mist humidifier or inhale steam from a shower to increase air moisture. This may keep secretions moist and make it easier to breathe.  Drink enough water and fluids to keep your urine clear or pale yellow.  Rest as needed.  Return to work when your temperature has returned to normal or as your caregiver advises. You may need to stay home longer to  avoid infecting others. You can also use a face mask and careful hand washing to prevent spread of the virus. SEEK MEDICAL CARE IF:   After the first few days, you feel you are getting worse rather than better.  You need your caregiver's advice about medicines to control symptoms.  You develop chills, worsening shortness of breath, or brown or red sputum. These may be signs of pneumonia.  You develop yellow or brown nasal discharge or pain in the face, especially when you bend forward. These may be signs of sinusitis.  You develop a fever, swollen neck glands, pain with swallowing, or white areas in the back of your throat. These may be signs of strep throat. SEEK IMMEDIATE MEDICAL CARE IF:   You have a fever.  You develop severe or persistent headache, ear pain, sinus pain, or chest pain.  You develop wheezing, a prolonged cough, cough up blood, or have a change in your usual mucus (if you have chronic lung disease).  You develop sore muscles or a stiff neck. Document Released: 06/26/2000 Document Revised: 03/25/2011 Document Reviewed: 04/07/2013 [facility] Patient Information 2015 [place], [place]. This information is not intended to replace advice given to you by your health care provider. Make sure you discuss any questions you have with your health care provider.      Standley Brooking. Panosh M.D.

## 2014-08-01 ENCOUNTER — Other Ambulatory Visit: Payer: Self-pay | Admitting: Family Medicine

## 2014-08-01 DIAGNOSIS — I10 Essential (primary) hypertension: Secondary | ICD-10-CM

## 2014-08-02 ENCOUNTER — Other Ambulatory Visit (INDEPENDENT_AMBULATORY_CARE_PROVIDER_SITE_OTHER): Payer: 59

## 2014-08-02 ENCOUNTER — Encounter: Payer: Self-pay | Admitting: Internal Medicine

## 2014-08-02 DIAGNOSIS — I10 Essential (primary) hypertension: Secondary | ICD-10-CM | POA: Diagnosis not present

## 2014-08-02 LAB — BASIC METABOLIC PANEL
BUN: 13 mg/dL (ref 6–23)
CHLORIDE: 105 meq/L (ref 96–112)
CO2: 30 meq/L (ref 19–32)
CREATININE: 0.74 mg/dL (ref 0.40–1.20)
Calcium: 9.4 mg/dL (ref 8.4–10.5)
GFR: 102.9 mL/min (ref >60.00)
Glucose, Bld: 103 mg/dL — ABNORMAL HIGH (ref 70–99)
Potassium: 4.5 mEq/L (ref 3.5–5.1)
SODIUM: 142 meq/L (ref 135–145)

## 2014-08-12 ENCOUNTER — Encounter: Payer: Self-pay | Admitting: Internal Medicine

## 2014-08-12 ENCOUNTER — Ambulatory Visit (INDEPENDENT_AMBULATORY_CARE_PROVIDER_SITE_OTHER): Payer: 59 | Admitting: Internal Medicine

## 2014-08-12 VITALS — BP 127/77 | Temp 98.4°F | Wt 156.0 lb

## 2014-08-12 DIAGNOSIS — I10 Essential (primary) hypertension: Secondary | ICD-10-CM | POA: Diagnosis not present

## 2014-08-12 MED ORDER — LOSARTAN POTASSIUM-HCTZ 100-12.5 MG PO TABS: 1.0000 | ORAL_TABLET | Freq: Every day | ORAL | Status: DC

## 2014-08-12 NOTE — Progress Notes (Signed)
Pre visit review using our clinic review tool, if applicable. No additional management support is needed unless otherwise documented below in the visit note.  Chief Complaint  Patient presents with  . Follow-up  . Hypertension    HPI: Comes in for follow up of Hypertension has machine and readings  In last month at goal  Below 140 ocass 110 range .  No obv se of med but getting ocass lightheadedness spells no syncope more a  Spinning feeling passes. No assoc sx .  ROS: See pertinent positives and negatives per HPI. No cough  Past Medical History  Diagnosis Date  . Hay fever   . Fibroids     uterine  . Anemia   . IBS (irritable bowel syndrome)   . WBC decreased   . Hx of colonoscopy     utd  . Hx of varicella     as a child  . Back pain     Family History  Problem Relation Age of Onset  . Arthritis Mother   . Cancer Father     bladder  . Hyperlipidemia Father   . Migraines Sister   . Hyperlipidemia Sister     low wbc  . Dementia      older aunt uncle gm 23s and 83s  . Obstructive Sleep Apnea Mother     on CPAP    History   Social History  . Marital Status: Married    Spouse Name: N/A  . Number of Children: N/A  . Years of Education: N/A   Social History Main Topics  . Smoking status: Never Smoker   . Smokeless tobacco: Not on file  . Alcohol Use: Yes  . Drug Use: No  . Sexual Activity: Not on file   Other Topics Concern  . 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 losartan-hydrochlorothiazide (HYZAAR) 100-12.5 MG per tablet Take 1 tablet by mouth daily. 90 tablet 1  . zolpidem (AMBIEN) 5 MG tablet Take  1-2 tabs at night as needed for sleep and travel (Patient not taking: Reported on 08/12/2014) 20 tablet 0   No facility-administered medications prior to visit.     EXAM:  BP 127/77 mmHg  Temp(Src) 98.4 F (36.9 C) (Oral)  Wt 156 lb (70.761 kg)  Body mass index is 26.57 kg/(m^2).  GENERAL: vitals reviewed and listed above, alert, oriented, appears well hydrated and in no acute distress HEENT: atraumatic, conjunctiva  clear, no obvious abnormalities on inspection of external nose and ears  rr bp repeated with and without machineMPSYCH: pleasant and cooperative, no obvious depression or anxiety Reviewed  readings BP Readings from Last 3 Encounters:  08/12/14 127/77  07/22/14 146/80  06/14/14 140/92   Lab Results  Component Value Date   WBC 4.1 11/09/2013   HGB 13.9 11/09/2013   HCT 41.9 11/09/2013   PLT 275.0 11/09/2013   GLUCOSE 103* 08/02/2014   CHOL 184 11/09/2013   TRIG 63.0 11/09/2013   HDL 57.80 11/09/2013   LDLCALC 114* 11/09/2013   ALT 24 11/09/2013  AST 21 11/09/2013   NA 142 08/02/2014   K 4.5 08/02/2014   CL 105 08/02/2014   CREATININE 0.74 08/02/2014   BUN 13 08/02/2014   CO2 30 08/02/2014   TSH 0.90 11/09/2013   HGBA1C 5.7 12/09/2012    ASSESSMENT AND PLAN:  Discussed the following assessment and plan:  Essential hypertension - now controlled readings follwl the episodes of dizzy may resolve  -Patient advised to return or notify health care team  if symptoms worsen ,persist or new concerns arise.  Patient Instructions  Blood pressure is much better .Marland Kitchen  Light headedness could be from medication and still go away.  BP  monitor is  Good enough  For  Management.   Lab tests are good.   Dont get dehydrated .  Consider taking  Medicine  in pm .    Standley Brooking. Panosh M.D.

## 2014-08-12 NOTE — Patient Instructions (Signed)
Blood pressure is much better .Marland Kitchen  Light headedness could be from medication and still go away.  BP  monitor is  Good enough  For  Management.   Lab tests are good.   Dont get dehydrated .  Consider taking  Medicine  in pm .

## 2014-08-16 ENCOUNTER — Encounter: Payer: Self-pay | Admitting: Internal Medicine

## 2014-08-29 ENCOUNTER — Emergency Department (HOSPITAL_BASED_OUTPATIENT_CLINIC_OR_DEPARTMENT_OTHER)
Admission: EM | Admit: 2014-08-29 | Discharge: 2014-08-29 | Disposition: A | Discharge status: Home / Self Care | Payer: 59 | Attending: Emergency Medicine | Admitting: Emergency Medicine

## 2014-08-29 ENCOUNTER — Telehealth: Payer: Self-pay

## 2014-08-29 DIAGNOSIS — Z86018 Personal history of other benign neoplasm: Secondary | ICD-10-CM | POA: Insufficient documentation

## 2014-08-29 DIAGNOSIS — R197 Diarrhea, unspecified: Secondary | ICD-10-CM | POA: Insufficient documentation

## 2014-08-29 DIAGNOSIS — R42 Dizziness and giddiness: Secondary | ICD-10-CM | POA: Diagnosis present

## 2014-08-29 DIAGNOSIS — J019 Acute sinusitis, unspecified: Secondary | ICD-10-CM | POA: Diagnosis not present

## 2014-08-29 DIAGNOSIS — Z862 Personal history of diseases of the blood and blood-forming organs and certain disorders involving the immune mechanism: Secondary | ICD-10-CM | POA: Diagnosis not present

## 2014-08-29 DIAGNOSIS — Z8619 Personal history of other infectious and parasitic diseases: Secondary | ICD-10-CM | POA: Insufficient documentation

## 2014-08-29 DIAGNOSIS — Z79899 Other long term (current) drug therapy: Secondary | ICD-10-CM | POA: Insufficient documentation

## 2014-08-29 MED ORDER — AMOXICILLIN-POT CLAVULANATE 875-125 MG PO TABS: 1.0000 | ORAL_TABLET | Freq: Two times a day (BID) | ORAL | Status: DC

## 2014-08-29 MED ORDER — FLUTICASONE PROPIONATE 50 MCG/ACT NA SUSP: 2.0000 | Freq: Every day | NASAL | Status: DC

## 2014-08-29 NOTE — ED Notes (Signed)
Pt. Reports she has had work in [place] and started with a sore throat on Thurs. And started feeling really bad on Friday with more cold type symptoms.  Pt. Reports she returned home on Sat.  Since Sat. Pt. Reports she has felt tired and today she started with dizziness and this morning several episodes of diarrhea and some nausea.

## 2014-08-29 NOTE — Discharge Instructions (Signed)
Take antibiotic twice daily for 1 week. Use nasal spray as directed. You ay also use over the counter decongestants. Rest and stay well hydrated.  Sinusitis Sinusitis is redness, soreness, and inflammation of the paranasal sinuses. Paranasal sinuses are air pockets within the bones of your face (beneath the eyes, the middle of the forehead, or above the eyes). In healthy paranasal sinuses, mucus is able to drain out, and air is able to circulate through them by way of your nose. However, when your paranasal sinuses are inflamed, mucus and air can become trapped. This can allow bacteria and other germs to grow and cause infection. Sinusitis can develop quickly and last only a short time (acute) or continue over a long period (chronic). Sinusitis that lasts for more than 12 weeks is considered chronic.  CAUSES  Causes of sinusitis include:  Allergies.  Structural abnormalities, such as displacement of the cartilage that separates your nostrils (deviated septum), which can decrease the air flow through your nose and sinuses and affect sinus drainage.  Functional abnormalities, such as when the small hairs (cilia) that line your sinuses and help remove mucus do not work properly or are not present. SIGNS AND SYMPTOMS  Symptoms of acute and chronic sinusitis are the same. The primary symptoms are pain and pressure around the affected sinuses. Other symptoms include:  Upper toothache.  Earache.  Headache.  Bad breath.  Decreased sense of smell and taste.  A cough, which worsens when you are lying flat.  Fatigue.  Fever.  Thick drainage from your nose, which often is green and may contain pus (purulent).  Swelling and warmth over the affected sinuses. DIAGNOSIS  Your health care provider will perform a physical exam. During the exam, your health care provider may:  Look in your nose for signs of abnormal growths in your nostrils (nasal polyps).  Tap over the affected sinus to check  for signs of infection.  View the inside of your sinuses (endoscopy) using an imaging device that has a light attached (endoscope). If your health care provider suspects that you have chronic sinusitis, one or more of the following tests may be recommended:  Allergy tests.  Nasal culture. A sample of mucus is taken from your nose, sent to a lab, and screened for bacteria.  Nasal cytology. A sample of mucus is taken from your nose and examined by your health care provider to determine if your sinusitis is related to an allergy. TREATMENT  Most cases of acute sinusitis are related to a viral infection and will resolve on their own within 10 days. Sometimes medicines are prescribed to help relieve symptoms (pain medicine, decongestants, nasal steroid sprays, or saline sprays).  However, for sinusitis related to a bacterial infection, your health care provider will prescribe antibiotic medicines. These are medicines that will help kill the bacteria causing the infection.  Rarely, sinusitis is caused by a fungal infection. In theses cases, your health care provider will prescribe antifungal medicine. For some cases of chronic sinusitis, surgery is needed. Generally, these are cases in which sinusitis recurs more than 3 times per year, despite other treatments. HOME CARE INSTRUCTIONS   Drink plenty of water. Water helps thin the mucus so your sinuses can drain more easily.  Use a humidifier.  Inhale steam 3 to 4 times a day (for example, sit in the bathroom with the shower running).  Apply a warm, moist washcloth to your face 3 to 4 times a day, or as directed by your health  care provider.  Use saline nasal sprays to help moisten and clean your sinuses.  Take medicines only as directed by your health care provider.  If you were prescribed either an antibiotic or antifungal medicine, finish it all even if you start to feel better. SEEK IMMEDIATE MEDICAL CARE IF:  You have increasing pain or  severe headaches.  You have nausea, vomiting, or drowsiness.  You have swelling around your face.  You have vision problems.  You have a stiff neck.  You have difficulty breathing. MAKE SURE YOU:   Understand these instructions.  Will watch your condition.  Will get help right away if you are not doing well or get worse. Document Released: 12/31/2004 Document Revised: 05/17/2013 Document Reviewed: 01/15/2011 [facility] Patient Information 2015 [place], [place]. This information is not intended to replace advice given to you by your health care provider. Make sure you discuss any questions you have with your health care provider.

## 2014-08-29 NOTE — ED Notes (Signed)
PA at bedside.

## 2014-08-29 NOTE — ED Provider Notes (Signed)
CSN: [id number]     Arrival date & time 08/29/14  1646 History   First MD Initiated Contact with Patient 08/29/14 1701     Chief Complaint  Patient presents with  . Dizziness     (Consider location/radiation/quality/duration/timing/severity/associated sxs/prior Treatment) HPI Comments: 59 year old female presenting with sinus pressure and fatigue 3 days. She was on a boat cruise in [place] with coworkers around a lot of people 4 days ago and developed a sore throat with a nonproductive cough. She flew back to [place] 2 days later and started to get cold symptoms. While on the plane, she felt very fatigued, and has slept all weekend. This morning, she started to feel lightheaded and dizzy with a lot of sinus pressure, worse with leaning forward. Tried over-the-counter DayQuil and Tylenol Cold and sinus with mild relief. Despite triage summary, denies nausea. No vomiting. She's had a few episodes of nonbloody diarrhea. No abdominal pain. No fevers but reports chills. No chest pain or shortness of breath. Reports being around many people over the past 4 days.  Patient is a 59 y.o. female presenting with dizziness. The history is provided by the patient.  Dizziness Associated symptoms: diarrhea     Past Medical History  Diagnosis Date  . Hay fever   . Fibroids     uterine  . Anemia   . IBS (irritable bowel syndrome)   . WBC decreased   . Hx of colonoscopy     utd  . Hx of varicella     as a child  . Back pain    Past Surgical History  Procedure Laterality Date  . Bladder repair w/ cesarean section  1987  . Gallbladder surgery  1977  . Vesicovaginal fistula closure w/ tah  2003    for fibroids and endometriosis   Family History  Problem Relation Age of Onset  . Arthritis Mother   . Cancer Father     bladder  . Hyperlipidemia Father   . Migraines Sister   . Hyperlipidemia Sister     low wbc  . Dementia      older aunt uncle gm 31s and 66s  . Obstructive Sleep Apnea Mother      on CPAP   Social History  Substance Use Topics  . Smoking status: Never Smoker   . Smokeless tobacco: Not on file  . Alcohol Use: Yes   OB History    No data available     Review of Systems  Constitutional: Positive for chills and fatigue.  HENT: Positive for congestion, postnasal drip, sinus pressure and sore throat.   Respiratory: Positive for cough.   Gastrointestinal: Positive for diarrhea.  Neurological: Positive for dizziness and light-headedness.  All other systems reviewed and are negative.     Allergies  Review of patient's allergies indicates no known allergies.  Home Medications   Prior to Admission medications   Medication Sig Start Date End Date Taking? Authorizing Provider  amoxicillin-clavulanate (AUGMENTIN) 875-125 MG per tablet Take 1 tablet by mouth 2 (two) times daily. One po bid x 7 days 08/29/14   Carman Ching, PA-C  fluticasone [facility]) 50 MCG/ACT nasal spray Place 2 sprays into both nostrils daily. 08/29/14   Karl Knarr M Tayshun Gappa, PA-C  losartan-hydrochlorothiazide (HYZAAR) 100-12.5 MG per tablet Take 1 tablet by mouth daily. 08/12/14   Burnis Medin, MD  RESTASIS 0.05 % ophthalmic emulsion instill 1 drop into both eyes twice a day 07/27/14   Historical Provider, MD   BP 121/76  mmHg  Pulse 64  Temp(Src) 98.5 F (36.9 C) (Oral)  Resp 16  SpO2 97% Physical Exam  Constitutional: She is oriented to person, place, and time. She appears well-developed and well-nourished. No distress.  HENT:  Head: Normocephalic and atraumatic.  Right Ear: Tympanic membrane normal.  Left Ear: Tympanic membrane normal.  Nose: Mucosal edema present. Right sinus exhibits maxillary sinus tenderness and frontal sinus tenderness. Left sinus exhibits maxillary sinus tenderness and frontal sinus tenderness.  Mouth/Throat: Oropharynx is clear and moist.  Eyes: Conjunctivae and EOM are normal. Pupils are equal, round, and reactive to light.  Neck: Normal range of motion. Neck  supple. No JVD present.  Cardiovascular: Normal rate, regular rhythm, normal heart sounds and intact distal pulses.   No extremity edema.  Pulmonary/Chest: Effort normal and breath sounds normal. No respiratory distress.  Abdominal: Soft. Bowel sounds are normal. There is no tenderness.  Musculoskeletal: Normal range of motion. She exhibits no edema.  Neurological: She is alert and oriented to person, place, and time. She has normal strength. No cranial nerve deficit or sensory deficit. Coordination and gait normal. GCS eye subscore is 4. GCS verbal subscore is 5. GCS motor subscore is 6.  Speech fluent, goal oriented. Moves extremities without ataxia. Equal grip strength bilateral.  Skin: Skin is warm and dry. She is not diaphoretic.  Psychiatric: She has a normal mood and affect. Her behavior is normal.  Nursing note and vitals reviewed.   ED Course  Procedures (including critical care time) Labs Review Labs Reviewed - No data to display  Imaging Review No results found. I, Lucien Mons, personally reviewed and evaluated these images and lab results as part of my medical decision-making.   EKG Interpretation None      MDM   Final diagnoses:  Acute sinusitis, recurrence not specified, unspecified location   Nontoxic appearing, NAD. AF VSS. Symptoms consistent with sinus congestion/infection/pressure. I do not feel her dizziness is coming from any intracranial process or vertigo. No focal neurologic deficits. Symptoms exacerbated on leaning forward. Advised the patient to get lots of rest, stay well hydrated. Will treat with antibiotics and Flonase. Follow-up with PCP. Stable for discharge. Return precautions given. Patient states understanding of treatment care plan and is agreeable.  Carman Ching, PA-C 08/29/14 1729  Debby Freiberg, MD 09/04/14 [phone]

## 2014-08-29 NOTE — Telephone Encounter (Signed)
Pt and her husband came into the office with complaints of fever, diarrhea (3 to 4 episodes this morning),dizziness, chills, fatigue, sleeping a lot, eye pain/sinus pain and pressure in the face, and bilateral leg tightness/tenderness like pt has been walking a lot. Pt states the symptoms started when she went to [facility] on a dinner cruise on Thursday.  Pt states it was muggy and her throat became raspy.  On Friday pt states she began sneezing and by Saturday she was experiencing fatigue and slept all night the prior night, most of the plane ride home and then she arrived home and went back to sleep.  Pt has been taking OTC sinus medications (Dayquil) and she took Tylenol around 2:30 or 2:45.  Pt states her bp at home was 110/77 but in the office pt's bp was 130/100. O2 was 98, pulse 61, and temperature 98.4.  Discussed pt's symptoms with Dr. Sarajane Jews and he advised that pt go to ER due to possibly needing lab work and IV fluids.  Pt and her husband verbalized understanding.

## 2014-09-02 ENCOUNTER — Encounter: Payer: Self-pay | Admitting: Adult Health

## 2014-09-02 ENCOUNTER — Ambulatory Visit (INDEPENDENT_AMBULATORY_CARE_PROVIDER_SITE_OTHER): Payer: 59 | Admitting: Adult Health

## 2014-09-02 ENCOUNTER — Telehealth: Payer: Self-pay | Admitting: Internal Medicine

## 2014-09-02 VITALS — BP 128/80 | Temp 98.3°F | Ht 64.25 in | Wt 154.7 lb

## 2014-09-02 DIAGNOSIS — H9209 Otalgia, unspecified ear: Secondary | ICD-10-CM | POA: Insufficient documentation

## 2014-09-02 DIAGNOSIS — H9201 Otalgia, right ear: Secondary | ICD-10-CM | POA: Diagnosis not present

## 2014-09-02 DIAGNOSIS — J029 Acute pharyngitis, unspecified: Secondary | ICD-10-CM | POA: Diagnosis not present

## 2014-09-02 NOTE — Telephone Encounter (Signed)
Noted.  Pt will call back in necessary.

## 2014-09-02 NOTE — Progress Notes (Signed)
Subjective:    Patient ID: Stacey Young, female    DOB: 1955-10-31, 59 y.o.   MRN: [id number]  HPI  59 year old female who presents to the office today for the acute issue of right ear pain, she has felt " a couple of shooting pains" today. She also endorses a "scratchy sore throat" since 08/29/2014 when she was seen in the ER and diagnosed with sinusitis and has been started on Amoxicillin. She states " I am just here to make sure nothing is going on with me because I am leaving on a trip Sunday. Denies any fever, nausea, vomiting, diarrhea.   She does endorse relief from sinus pain and pressure since starting Amoxicillin and her cough has started to decrease.     Review of Systems  Constitutional: Negative.   HENT: Positive for sore throat.   Eyes: Negative.   Respiratory: Positive for cough. Negative for chest tightness and shortness of breath.   Cardiovascular: Negative.   Gastrointestinal: Negative.   Endocrine: Negative.   Musculoskeletal: Negative.   Skin: Negative.   Neurological: Negative.   All other systems reviewed and are negative.  Past Medical History  Diagnosis Date  . Hay fever   . Fibroids     uterine  . Anemia   . IBS (irritable bowel syndrome)   . WBC decreased   . Hx of colonoscopy     utd  . Hx of varicella     as a child  . Back pain     Social History   Social History  . Marital Status: Married    Spouse Name: N/A  . Number of Children: N/A  . Years of Education: N/A   Occupational History  . Not on file.   Social History Main Topics  . Smoking status: Never Smoker   . Smokeless tobacco: Not on file  . Alcohol Use: Yes  . Drug Use: No  . Sexual Activity: Not on file   Other Topics Concern  . 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 Bladder repair w/ cesarean section  1987  . Gallbladder surgery  1977  . Vesicovaginal fistula closure w/ tah  2003    for fibroids and endometriosis    Family History  Problem Relation Age of Onset  . Arthritis Mother   . Cancer Father     bladder  . Hyperlipidemia Father   . Migraines Sister   . Hyperlipidemia Sister     low wbc  . Dementia      older aunt uncle gm 79s and 86s  . Obstructive Sleep Apnea Mother     on CPAP    No Known Allergies  Current Outpatient Prescriptions on File Prior to Visit  Medication Sig Dispense Refill  . amoxicillin-clavulanate (AUGMENTIN) 875-125 MG per tablet Take 1 tablet by mouth 2 (two) times daily. One po bid x 7 days 14 tablet 0  . fluticasone (FLONASE) 50 MCG/ACT nasal spray Place 2 sprays into both nostrils daily. 16 g 0  .  losartan-hydrochlorothiazide (HYZAAR) 100-12.5 MG per tablet Take 1 tablet by mouth daily. 30 tablet 0  . RESTASIS 0.05 % ophthalmic emulsion instill 1 drop into both eyes twice a day  0   No current facility-administered medications on file prior to visit.    BP 128/80 mmHg  Temp(Src) 98.3 F (36.8 C) (Oral)  Ht 5' 4.25" (1.632 m)  Wt 154 lb 11.2 oz (70.171 kg)  BMI 26.35 kg/m2       Objective:   Physical Exam  Constitutional: She is oriented to person, place, and time. She appears well-developed and well-nourished. No distress.  HENT:  Head: Normocephalic and atraumatic.  Right Ear: External ear normal.  Left Ear: External ear normal.  Nose: Nose normal.  Mouth/Throat: Oropharynx is clear and moist. No oropharyngeal exudate.  TM's visualized, no signs of effusion or infection.  No swelling, redness or exudate on tonsils or back of throat.   Eyes: Right eye exhibits no discharge. Left eye exhibits no discharge.  Neck: Normal range of motion. Neck  supple.  Cardiovascular: Normal rate, regular rhythm, normal heart sounds and intact distal pulses.  Exam reveals no gallop and no friction rub.   No murmur heard. Pulmonary/Chest: Effort normal and breath sounds normal. No respiratory distress. She has no wheezes. She has no rales. She exhibits no tenderness.  Lymphadenopathy:    She has no cervical adenopathy.  Neurological: She is alert and oriented to person, place, and time.  Skin: Skin is warm and dry. No rash noted. She is not diaphoretic. No erythema. No pallor.  Psychiatric: She has a normal mood and affect. Her behavior is normal. Judgment and thought content normal.  Nursing note and vitals reviewed.      Assessment & Plan:  1. Sore throat - Likely residual from sinusitis and cough  - No infection seen  -Refused Magic Mouthwash - Salt water gargle and use honey  - Follow up if no improvement.    2. Ear pain, right - Unclear etiology of ear pain. There is no effusion, signs of infection, or cerumen impaction. TM is in tact.  - Follow up if ear pain continued.  - Continue to use Flonase as directed.

## 2014-09-02 NOTE — Telephone Encounter (Signed)
[facility] - [facility]  Patient Name: Stacey Young  DOB: 14-Sep-1955    Initial Comment Caller states she ws diagnosed with sinus infection at ED earlier this week, has since developed ear pain and sore throat   Nurse Assessment  Nurse: Mechele Dawley, RN, Amy Date/Time Eilene Ghazi Time): 09/02/2014 9:07:37 AM  Confirm and document reason for call. If symptomatic, describe symptoms. ---CALLER STATES THAT SHE CAME INTO THE OFFICE AND WENT INTO THE ED. SHE WAS DX WITH SINUS INFECTION ON AMOXICILLIN AND FLONASE. YESTERDAY SHE STATES SHE WAS GETTING SHARP PAIN ON THE SIDE OF HER HEAD. SHE STATES THAT SHE IS HAVING PINK THROAT AND EAR PAIN. SHE IS GETTING READY TO GO ON A PLANE ON SUNDAY. SHE IS WANDERING IF THIS IS A SIDE EFFECT OF THE AMOXICILLIN. SHE WILL BE COMPLETED ON MONDAY. SHE IS NOT HAVING ANY FEVER CONTINUED.  Has the patient traveled out of the country within the last 30 days? ---Not Applicable  Does the patient require triage? ---Yes  Related visit to physician within the last 2 weeks? ---Yes  Does the PT have any chronic conditions? (i.e. diabetes, asthma, etc.) ---Yes  List chronic conditions. ---HYPERTENSION     Guidelines    Guideline Title Affirmed Question Affirmed Notes  Earache Patient sounds very sick or weak to the triager PLEASE SEE NOTES   Final Disposition User   Go to ED Now (or PCP triage) [place], RN, Amy    Comments  CALLER IS ON AMOXICILLIN WENT OVER THE S/E'S OF THIS MEDICATION. SHE IS CONCERNED ABOUT HER HAVING THE EAR TWINGES PERIODICALLY. SHE IS ONLY CONCERNED ABOUT THIS DUE TO HER GETTING ON THE PLANE FOR 6 HOURS ON SUNDAY. SHE IS GOING TO MONITOR HERSELF TODAY AND IF SHE CONTINUES TO GET THEM - SHE WILL CALL BACK FOR AN APPT TODAY OR TOMORROW MORNING AT [place]. SHE IS ON THE ANTIBIOTIC FOR SINUS INFECTION SO THAT SHOULD COVER THE EAR AS WELL. ENCOURAGED HER TO MAKE SURE SHE CALLS BACK.   SHE IS NOT COMPLAINING OF ANY OTHER COMPLICATIONS OTHER THAN THE EAR AND THROAT LOOKING PINK, NOT SORE.   Referrals  REFERRED TO PCP OFFICE   Disagree/Comply: Comply

## 2014-09-02 NOTE — Progress Notes (Signed)
Pre visit review using our clinic review tool, if applicable. No additional management support is needed unless otherwise documented below in the visit note. 

## 2014-11-11 ENCOUNTER — Telehealth: Payer: Self-pay | Admitting: Internal Medicine

## 2014-11-11 NOTE — Telephone Encounter (Signed)
Pt has a physical scheduled for 12/02/14 and called today to say she will be out of town on that day and need to reschedule. She is asking if she can still come in this year because she will be losing her insurance.

## 2014-11-14 NOTE — Telephone Encounter (Signed)
Please help the pt to come in by the end of the year.  Ok to schedule.

## 2014-11-15 NOTE — Telephone Encounter (Signed)
Lm for pt to call back

## 2014-11-15 NOTE — Telephone Encounter (Signed)
Pt has been reschedule to 12/12/14

## 2014-11-25 ENCOUNTER — Other Ambulatory Visit (INDEPENDENT_AMBULATORY_CARE_PROVIDER_SITE_OTHER): Payer: 59

## 2014-11-25 DIAGNOSIS — Z0000 Encounter for general adult medical examination without abnormal findings: Secondary | ICD-10-CM | POA: Diagnosis not present

## 2014-11-25 LAB — HEPATIC FUNCTION PANEL
ALT: 19 U/L (ref 0–35)
AST: 18 U/L (ref 0–37)
Albumin: 4.2 g/dL (ref 3.5–5.2)
Alkaline Phosphatase: 57 U/L (ref 39–117)
Bilirubin, Direct: 0.1 mg/dL (ref 0.0–0.3)
TOTAL PROTEIN: 7.3 g/dL (ref 6.0–8.3)
Total Bilirubin: 0.6 mg/dL (ref 0.2–1.2)

## 2014-11-25 LAB — CBC WITH DIFFERENTIAL/PLATELET
BASOS PCT: 0.5 % (ref 0.0–3.0)
Basophils Absolute: 0 10*3/uL (ref 0.0–0.1)
EOS PCT: 1.8 % (ref 0.0–5.0)
Eosinophils Absolute: 0.1 10*3/uL (ref 0.0–0.7)
HEMATOCRIT: 40.8 % (ref 36.0–46.0)
HEMOGLOBIN: 13.6 g/dL (ref 12.0–15.0)
LYMPHS PCT: 54.8 % — AB (ref 12.0–46.0)
Lymphs Abs: 2.2 10*3/uL (ref 0.7–4.0)
MCHC: 33.5 g/dL (ref 30.0–36.0)
MCV: 89.7 fl (ref 78.0–100.0)
Monocytes Absolute: 0.5 10*3/uL (ref 0.1–1.0)
Monocytes Relative: 12.6 % — ABNORMAL HIGH (ref 3.0–12.0)
Neutro Abs: 1.2 10*3/uL — ABNORMAL LOW (ref 1.4–7.7)
Neutrophils Relative %: 30.3 % — ABNORMAL LOW (ref 43.0–77.0)
Platelets: 230 10*3/uL (ref 150.0–400.0)
RBC: 4.54 Mil/uL (ref 3.87–5.11)
RDW: 12.9 % (ref 11.5–15.5)
WBC: 3.9 10*3/uL — ABNORMAL LOW (ref 4.0–10.5)

## 2014-11-25 LAB — BASIC METABOLIC PANEL
BUN: 19 mg/dL (ref 6–23)
CHLORIDE: 104 meq/L (ref 96–112)
CO2: 28 mEq/L (ref 19–32)
Calcium: 9.3 mg/dL (ref 8.4–10.5)
Creatinine, Ser: 0.83 mg/dL (ref 0.40–1.20)
GFR: 90.04 mL/min (ref >60.00)
Glucose, Bld: 100 mg/dL — ABNORMAL HIGH (ref 70–99)
POTASSIUM: 3.6 meq/L (ref 3.5–5.1)
Sodium: 139 mEq/L (ref 135–145)

## 2014-11-25 LAB — LIPID PANEL
Cholesterol: 185 mg/dL (ref 0–200)
HDL: 66.2 mg/dL (ref >39.00)
LDL CALC: 108 mg/dL — AB (ref 0–99)
NonHDL: 118.48
Total CHOL/HDL Ratio: 3
Triglycerides: 52 mg/dL (ref 0.0–149.0)
VLDL: 10.4 mg/dL (ref 0.0–40.0)

## 2014-11-25 LAB — TSH: TSH: 1.33 u[IU]/mL (ref 0.35–4.50)

## 2014-11-28 ENCOUNTER — Encounter: Payer: Self-pay | Admitting: Internal Medicine

## 2014-11-28 ENCOUNTER — Ambulatory Visit (INDEPENDENT_AMBULATORY_CARE_PROVIDER_SITE_OTHER): Payer: 59 | Admitting: Internal Medicine

## 2014-11-28 ENCOUNTER — Other Ambulatory Visit (HOSPITAL_COMMUNITY)
Admission: RE | Admit: 2014-11-28 | Discharge: 2014-11-28 | Disposition: A | Discharge status: Home / Self Care | Payer: 59 | Source: Ambulatory Visit | Attending: Internal Medicine | Admitting: Internal Medicine

## 2014-11-28 VITALS — BP 140/90 | Temp 98.2°F | Wt 153.0 lb

## 2014-11-28 DIAGNOSIS — N390 Urinary tract infection, site not specified: Secondary | ICD-10-CM | POA: Diagnosis not present

## 2014-11-28 DIAGNOSIS — N760 Acute vaginitis: Secondary | ICD-10-CM | POA: Diagnosis present

## 2014-11-28 DIAGNOSIS — N898 Other specified noninflammatory disorders of vagina: Secondary | ICD-10-CM | POA: Diagnosis not present

## 2014-11-28 DIAGNOSIS — R109 Unspecified abdominal pain: Secondary | ICD-10-CM | POA: Diagnosis not present

## 2014-11-28 DIAGNOSIS — R82998 Other abnormal findings in urine: Secondary | ICD-10-CM

## 2014-11-28 DIAGNOSIS — R350 Frequency of micturition: Secondary | ICD-10-CM | POA: Diagnosis not present

## 2014-11-28 LAB — POCT URINALYSIS DIPSTICK
Bilirubin, UA: NEGATIVE
Blood, UA: 1
GLUCOSE UA: NEGATIVE
Ketones, UA: NEGATIVE
NITRITE UA: NEGATIVE
PROTEIN UA: NEGATIVE
Spec Grav, UA: 1.015
UROBILINOGEN UA: 0.2
pH, UA: 6

## 2014-11-28 MED ORDER — METRONIDAZOLE 500 MG PO TABS: 500.0000 mg | ORAL_TABLET | Freq: Two times a day (BID) | ORAL | Status: DC

## 2014-11-28 NOTE — Patient Instructions (Signed)
Will notify you  of labs when available. consdier adding  Flagyl for  Possible BV   Will let you know results .   Vaginitis Vaginitis is an inflammation of the vagina. It is most often caused by a change in the normal balance of the bacteria and yeast that live in the vagina. This change in balance causes an overgrowth of certain bacteria or yeast, which causes the inflammation. There are different types of vaginitis, but the most common types are:  Bacterial vaginosis.  Yeast infection (candidiasis).  Trichomoniasis vaginitis. This is a sexually transmitted infection (STI).  Viral vaginitis.  Atrophic vaginitis.  Allergic vaginitis. CAUSES  The cause depends on the type of vaginitis. Vaginitis can be caused by:  Bacteria (bacterial vaginosis).  Yeast (yeast infection).  A parasite (trichomoniasis vaginitis)  A virus (viral vaginitis).  Low hormone levels (atrophic vaginitis). Low hormone levels can occur during pregnancy, breastfeeding, or after menopause.  Irritants, such as bubble baths, scented tampons, and feminine sprays (allergic vaginitis). Other factors can change the normal balance of the yeast and bacteria that live in the vagina. These include:  Antibiotic medicines.  Poor hygiene.  Diaphragms, vaginal sponges, spermicides, birth control pills, and intrauterine devices (IUD).  Sexual intercourse.  Infection.  Uncontrolled diabetes.  A weakened immune system. SYMPTOMS  Symptoms can vary depending on the cause of the vaginitis. Common symptoms include:  Abnormal vaginal discharge.  The discharge is white, gray, or yellow with bacterial vaginosis.  The discharge is thick, white, and cheesy with a yeast infection.  The discharge is frothy and yellow or greenish with trichomoniasis.  A bad vaginal odor.  The odor is fishy with bacterial vaginosis.  Vaginal itching, pain, or swelling.  Painful intercourse.  Pain or burning when  urinating. Sometimes, there are no symptoms. TREATMENT  Treatment will vary depending on the type of infection.   Bacterial vaginosis and trichomoniasis are often treated with antibiotic creams or pills.  Yeast infections are often treated with antifungal medicines, such as vaginal creams or suppositories.  Viral vaginitis has no cure, but symptoms can be treated with medicines that relieve discomfort. Your sexual partner should be treated as well.  Atrophic vaginitis may be treated with an estrogen cream, pill, suppository, or vaginal ring. If vaginal dryness occurs, lubricants and moisturizing creams may help. You may be told to avoid scented soaps, sprays, or douches.  Allergic vaginitis treatment involves quitting the use of the product that is causing the problem. Vaginal creams can be used to treat the symptoms. HOME CARE INSTRUCTIONS   Take all medicines as directed by your caregiver.  Keep your genital area clean and dry. Avoid soap and only rinse the area with water.  Avoid douching. It can remove the healthy bacteria in the vagina.  Do not use tampons or have sexual intercourse until your vaginitis has been treated. Use sanitary pads while you have vaginitis.  Wipe from front to back. This avoids the spread of bacteria from the rectum to the vagina.  Let air reach your genital area.  Wear cotton underwear to decrease moisture buildup.  Avoid wearing underwear while you sleep until your vaginitis is gone.  Avoid tight pants and underwear or nylons without a cotton panel.  Take off wet clothing (especially bathing suits) as soon as possible.  Use mild, non-scented products. Avoid using irritants, such as:  Scented feminine sprays.  Fabric softeners.  Scented detergents.  Scented tampons.  Scented soaps or bubble baths.  Practice safe  sex and use condoms. Condoms may prevent the spread of trichomoniasis and viral vaginitis. SEEK MEDICAL CARE IF:   You have  abdominal pain.  You have a fever or persistent symptoms for more than 2-3 days.  You have a fever and your symptoms suddenly get worse.   This information is not intended to replace advice given to you by your health care provider. Make sure you discuss any questions you have with your health care provider.   Document Released: 10/28/2006 Document Revised: 05/17/2014 Document Reviewed: 06/13/2011 Elsevier Interactive Patient Education [employer].

## 2014-11-28 NOTE — Progress Notes (Signed)
Pre visit review using our clinic review tool, if applicable. No additional management support is needed unless otherwise documented below in the visit note.  Chief Complaint  Patient presents with  . Vaginal Discharge    Clear and cloudy discharge.  Started Friday.  . Urinary Frequency  . Abdominal Discomfort  . Dyspareunia    HPI: Patient Stacey Young  comes in today for SDA for  new problem evaluation. Soak  pantiliners  Clear   Not urine  But clear like    Recently .   Some frequency.   Feels like pre period full.  No blood in urine .  Self rx .   Not like urine .  Not like yeast no itching  No other discomfort otherwise  Has hx of vag dryness with ic and discomfort .  Suprapubic  discomfort   No pain  ROS: See pertinent positives and negatives per HPI. No fever systemic sx   Past Medical History  Diagnosis Date  . Hay fever   . Fibroids     uterine  . Anemia   . IBS (irritable bowel syndrome)   . WBC decreased   . Hx of colonoscopy     utd  . Hx of varicella     as a child  . Back pain     Family History  Problem Relation Age of Onset  . Arthritis Mother   . Cancer Father     bladder  . Hyperlipidemia Father   . Migraines Sister   . Hyperlipidemia Sister     low wbc  . Dementia      older aunt uncle gm 61s and 2s  . Obstructive Sleep Apnea Mother     on CPAP    Social History   Social History  . Marital Status: Married    Spouse Name: N/A  . Number of Children: N/A  . Years of Education: N/A   Social History Main Topics  . Smoking status: Never Smoker   . Smokeless tobacco: None  . Alcohol Use: Yes  . Drug Use: No  . Sexual Activity: Not Asked   Other Topics Concern  . 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 [facility]    Outpatient Prescriptions Prior to Visit  Medication Sig Dispense Refill  . losartan-hydrochlorothiazide (HYZAAR) 100-12.5 MG per tablet Take 1 tablet by mouth daily. 30 tablet 0  . fluticasone (FLONASE) 50 MCG/ACT nasal spray Place 2 sprays into both nostrils daily. (Patient not taking: Reported on 11/28/2014) 16 g 0  . amoxicillin-clavulanate (AUGMENTIN) 875-125 MG per tablet Take 1 tablet by mouth 2 (two) times daily. One po bid x 7 days 14 tablet 0  . RESTASIS 0.05 % ophthalmic emulsion instill 1 drop into both eyes twice a day  0   No facility-administered medications prior to visit.     EXAM:  BP 140/90 mmHg  Temp(Src) 98.2 F (36.8 C) (Oral)  Wt 153 lb (69.4 kg)  Body mass index is 26.06 kg/(m^2).  GENERAL: vitals reviewed and listed above, alert, oriented, appears well hydrated and in no acute distress HEENT: atraumatic, conjunctiva  clear, no obvious abnormalities on inspection of external nose and ears PSYCH: pleasant and cooperative, no obvious depression or anxiety Ext gu nl vaginal thin watery yellowish foamy   Mild redness no lesion no cx  Send for  y trick and bv evaluation ua 2+ leuk cx pending  ASSESSMENT AND PLAN:  Discussed the following assessment and plan:  Vaginal discharge - Plan: Cervicovaginal ancillary only  Abdominal discomfort - Plan: POC Urinalysis Dipstick, Culture, Urine  Urinary frequency - Plan: POC Urinalysis Dipstick, Culture, Urine  Urine leukocytes - Plan: Culture, Urine Exam  Watery clear dc non specific  UA abnormal check for trich bv non specific   Had hysterectomy  dsic empiric r wtill be away at meeting but can leave message about plan  -Patient advised to return or notify health care team  if symptoms worsen ,persist or new concerns arise. Total visit 58mins > 50% spent counseling and coordinating care as indicated in above note and in instructions to patient .  Patient Instructions  Will  notify you  of labs when available. consdier adding  Flagyl for  Possible BV   Will let you know results .   Vaginitis Vaginitis is an inflammation of the vagina. It is most often caused by a change in the normal balance of the bacteria and yeast that live in the vagina. This change in balance causes an overgrowth of certain bacteria or yeast, which causes the inflammation. There are different types of vaginitis, but the most common types are:  Bacterial vaginosis.  Yeast infection (candidiasis).  Trichomoniasis vaginitis. This is a sexually transmitted infection (STI).  Viral vaginitis.  Atrophic vaginitis.  Allergic vaginitis. CAUSES  The cause depends on the type of vaginitis. Vaginitis can be caused by:  Bacteria (bacterial vaginosis).  Yeast (yeast infection).  A parasite (trichomoniasis vaginitis)  A virus (viral vaginitis).  Low hormone levels (atrophic vaginitis). Low hormone levels can occur during pregnancy, breastfeeding, or after menopause.  Irritants, such as bubble baths, scented tampons, and feminine sprays (allergic vaginitis). Other factors can change the normal balance of the yeast and bacteria that live in the vagina. These include:  Antibiotic medicines.  Poor hygiene.  Diaphragms, vaginal sponges, spermicides, birth control pills, and intrauterine devices (IUD).  Sexual intercourse.  Infection.  Uncontrolled diabetes.  A weakened immune system. SYMPTOMS  Symptoms can vary depending on the cause of the vaginitis. Common symptoms include:  Abnormal vaginal discharge.  The discharge is white, gray, or yellow with bacterial vaginosis.  The discharge is thick, white, and cheesy with a yeast infection.  The discharge is frothy and yellow or greenish with trichomoniasis.  A bad vaginal odor.  The odor is fishy with bacterial vaginosis.  Vaginal itching, pain, or swelling.  Painful intercourse.  Pain or burning when  urinating. Sometimes, there are no symptoms. TREATMENT  Treatment will vary depending on the type of infection.   Bacterial vaginosis and trichomoniasis are often treated with antibiotic creams or pills.  Yeast infections are often treated with antifungal medicines, such as vaginal creams or suppositories.  Viral vaginitis has no cure, but symptoms can be treated with medicines that relieve discomfort. Your sexual partner should be treated as well.  Atrophic vaginitis may be treated with an estrogen cream, pill, suppository, or vaginal ring. If vaginal dryness occurs, lubricants and moisturizing creams may help. You may be told to avoid scented soaps, sprays, or douches.  Allergic vaginitis treatment involves quitting the use of the product that is causing  the problem. Vaginal creams can be used to treat the symptoms. HOME CARE INSTRUCTIONS   Take all medicines as directed by your caregiver.  Keep your genital area clean and dry. Avoid soap and only rinse the area with water.  Avoid douching. It can remove the healthy bacteria in the vagina.  Do not use tampons or have sexual intercourse until your vaginitis has been treated. Use sanitary pads while you have vaginitis.  Wipe from front to back. This avoids the spread of bacteria from the rectum to the vagina.  Let air reach your genital area.  Wear cotton underwear to decrease moisture buildup.  Avoid wearing underwear while you sleep until your vaginitis is gone.  Avoid tight pants and underwear or nylons without a cotton panel.  Take off wet clothing (especially bathing suits) as soon as possible.  Use mild, non-scented products. Avoid using irritants, such as:  Scented feminine sprays.  Fabric softeners.  Scented detergents.  Scented tampons.  Scented soaps or bubble baths.  Practice safe sex and use condoms. Condoms may prevent the spread of trichomoniasis and viral vaginitis. SEEK MEDICAL CARE IF:   You have  abdominal pain.  You have a fever or persistent symptoms for more than 2-3 days.  You have a fever and your symptoms suddenly get worse.   This information is not intended to replace advice given to you by your health care provider. Make sure you discuss any questions you have with your health care provider.   Document Released: 10/28/2006 Document Revised: 05/17/2014 Document Reviewed: 06/13/2011 Elsevier Interactive Patient Education 2016 Dewy Rose K. Yunique Dearcos M.D.

## 2014-11-29 LAB — CERVICOVAGINAL ANCILLARY ONLY
Chlamydia: NEGATIVE
NEISSERIA GONORRHEA: NEGATIVE
Trichomonas: NEGATIVE

## 2014-11-30 LAB — CERVICOVAGINAL ANCILLARY ONLY
BACTERIAL VAGINITIS: NEGATIVE
CANDIDA VAGINITIS: NEGATIVE

## 2014-11-30 LAB — URINE CULTURE: Colony Count: 25000

## 2014-12-02 ENCOUNTER — Other Ambulatory Visit: Payer: Self-pay | Admitting: Family Medicine

## 2014-12-02 ENCOUNTER — Encounter: Payer: Self-pay | Admitting: Internal Medicine

## 2014-12-02 MED ORDER — AMOXICILLIN 875 MG PO TABS: 875.0000 mg | ORAL_TABLET | Freq: Two times a day (BID) | ORAL | Status: DC

## 2014-12-07 ENCOUNTER — Ambulatory Visit (INDEPENDENT_AMBULATORY_CARE_PROVIDER_SITE_OTHER): Payer: 59 | Admitting: Internal Medicine

## 2014-12-07 ENCOUNTER — Encounter: Payer: Self-pay | Admitting: Internal Medicine

## 2014-12-07 VITALS — BP 142/92 | Temp 98.1°F | Wt 153.5 lb

## 2014-12-07 DIAGNOSIS — N760 Acute vaginitis: Secondary | ICD-10-CM

## 2014-12-07 DIAGNOSIS — B950 Streptococcus, group A, as the cause of diseases classified elsewhere: Secondary | ICD-10-CM

## 2014-12-07 NOTE — Progress Notes (Signed)
Pre visit review using our clinic review tool, if applicable. No additional management support is needed unless otherwise documented below in the visit note.  Chief Complaint  Patient presents with  . Follow-up    HPI: Stacey Young 59 y.o. comes if for fu of fabinal dc and pos urine cx for ggp a beta strep.  Began flagyl and improved vag dc and then developed a st more scratchy but very red . She discovered she only tool the flagyl qd called and the nurse triage told her to go to the urgent care because that is what the protochol told her .  She is on the amox, asked pharmacist  About meds and reassured prob ok. Now on the last day s of bid flagyl and also amox.  The triage nurse made her apprehensive about not going to urgent care but she didn't go cause she was out of town and didn't feel very sick .  Her vag sx are about 90% better and throat minimal issues.   Cam ein to get my opinion about what to do with meds.  ROS: See pertinent positives and negatives per HPI.  Past Medical History  Diagnosis Date  . Hay fever   . Fibroids     uterine  . Anemia   . IBS (irritable bowel syndrome)   . WBC decreased   . Hx of colonoscopy     utd  . Hx of varicella     as a child  . Back pain     Family History  Problem Relation Age of Onset  . Arthritis Mother   . Cancer Father     bladder  . Hyperlipidemia Father   . Migraines Sister   . Hyperlipidemia Sister     low wbc  . Dementia      older aunt uncle gm 35s and 22s  . Obstructive Sleep Apnea Mother     on CPAP    Social History   Social History  . Marital Status: Married    Spouse Name: N/A  . Number of Children: N/A  . Years of Education: N/A   Social History Main Topics  . Smoking status: Never Smoker   . Smokeless tobacco: None  . Alcohol Use: Yes  . Drug Use: No  . Sexual Activity: Not Asked   Other Topics Concern  . 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 amoxicillin (AMOXIL) 875 MG tablet Take 1 tablet (875 mg total) by mouth 2 (two) times daily. 20 tablet 0  . fluticasone (FLONASE) 50 MCG/ACT nasal spray Place 2 sprays into both nostrils daily. 16 g 0  . losartan-hydrochlorothiazide (HYZAAR) 100-12.5 MG per tablet Take 1 tablet by mouth daily. 30 tablet 0  . metroNIDAZOLE (FLAGYL) 500 MG tablet Take 1 tablet (500 mg total) by mouth 2 (two) times daily. (Patient not taking: Reported on 12/07/2014) 14 tablet 0   No facility-administered medications prior to visit.     EXAM:  BP 142/92 mmHg  Temp(Src) 98.1 F (36.7 C) (  Oral)  Wt 153 lb 8 oz (69.627 kg)  Body mass index is 26.14 kg/(m^2).  GENERAL: vitals reviewed and listed above, alert, oriented, appears well hydrated and in no acute distress HEENT: atraumatic, conjunctiva  clear, no obvious abnormalities on inspection of external nose and ears OP : no lesion edema or exudate no lesions NECK: no obvious masses on inspection palpation  MS: moves all extremities without noticeable focal  abnormality PSYCH: pleasant and cooperative, no obvious depression or anxiety Reviewed data ASSESSMENT AND PLAN:  Discussed the following assessment and plan:  Vaginitis - atypical rx with falgyl some respinse but took only 1 per day for the firs 4 days  continue empiric and stay on amox will recheck next week  Group A streptococcal infection - present in urine seems to have helped sx  continue med  Curious situation    Concern about  How she took the medication  Apologies for  Advice that did not seem helpful and caused more concern. Finish out  Both meds and will recheck ua and vag exam  next week when comes for her cpx .  -Patient advised to return or notify health care team  if symptoms worsen ,persist or new concerns arise.  Patient Instructions  Finish medicine as planned    Don't  think   Need to add other med . Ok to add miconazole   If get yeast sx .  Otherwise  Finish  Amoxicillin and metronidazole  as planned.      Standley Brooking. Panosh M.D.

## 2014-12-07 NOTE — Patient Instructions (Signed)
Finish medicine as planned    Don't  think   Need to add other med . Ok to add miconazole   If get yeast sx .  Otherwise  Finish  Amoxicillin and metronidazole  as planned.

## 2014-12-12 ENCOUNTER — Ambulatory Visit (INDEPENDENT_AMBULATORY_CARE_PROVIDER_SITE_OTHER): Payer: 59 | Admitting: Internal Medicine

## 2014-12-12 ENCOUNTER — Encounter: Payer: Self-pay | Admitting: Internal Medicine

## 2014-12-12 VITALS — BP 146/86 | Temp 98.3°F | Ht 64.0 in | Wt 153.8 lb

## 2014-12-12 DIAGNOSIS — Z79899 Other long term (current) drug therapy: Secondary | ICD-10-CM

## 2014-12-12 DIAGNOSIS — I10 Essential (primary) hypertension: Secondary | ICD-10-CM

## 2014-12-12 DIAGNOSIS — B950 Streptococcus, group A, as the cause of diseases classified elsewhere: Secondary | ICD-10-CM

## 2014-12-12 DIAGNOSIS — Z0000 Encounter for general adult medical examination without abnormal findings: Secondary | ICD-10-CM

## 2014-12-12 DIAGNOSIS — N760 Acute vaginitis: Secondary | ICD-10-CM

## 2014-12-12 NOTE — Patient Instructions (Addendum)
Vaginal exam looks normal  yealry check up  Or as needed. Goal bp is below 140/90     Health Maintenance, Female Adopting a healthy lifestyle and getting preventive care can go a long way to promote health and wellness. Talk with your health care provider about what schedule of regular examinations is right for you. This is a good chance for you to check in with your provider about disease prevention and staying healthy. In between checkups, there are plenty of things you can do on your own. Experts have done a lot of research about which lifestyle changes and preventive measures are most likely to keep you healthy. Ask your health care provider for more information. WEIGHT AND DIET  Eat a healthy diet  Be sure to include plenty of vegetables, fruits, low-fat dairy products, and lean protein.  Do not eat a lot of foods high in solid fats, added sugars, or salt.  Get regular exercise. This is one of the most important things you can do for your health.  Most adults should exercise for at least 150 minutes each week. The exercise should increase your heart rate and make you sweat (moderate-intensity exercise).  Most adults should also do strengthening exercises at least twice a week. This is in addition to the moderate-intensity exercise.  Maintain a healthy weight  Body mass index (BMI) is a measurement that can be used to identify possible weight problems. It estimates body fat based on height and weight. Your health care provider can help determine your BMI and help you achieve or maintain a healthy weight.  For females 1 years of age and older:   A BMI below 18.5 is considered underweight.  A BMI of 18.5 to 24.9 is normal.  A BMI of 25 to 29.9 is considered overweight.  A BMI of 30 and above is considered obese.  Watch levels of cholesterol and blood lipids  You should start having your blood tested for lipids and cholesterol at 60 years of age, then have this test every 5  years.  You may need to have your cholesterol levels checked more often if:  Your lipid or cholesterol levels are high.  You are older than 60 years of age.  You are at high risk for heart disease.  CANCER SCREENING   Lung Cancer  Lung cancer screening is recommended for adults 19-63 years old who are at high risk for lung cancer because of a history of smoking.  A yearly low-dose CT scan of the lungs is recommended for people who:  Currently smoke.  Have quit within the past 15 years.  Have at least a 30-pack-year history of smoking. A pack year is smoking an average of one pack of cigarettes a day for 1 year.  Yearly screening should continue until it has been 15 years since you quit.  Yearly screening should stop if you develop a health problem that would prevent you from having lung cancer treatment.  Breast Cancer  Practice breast self-awareness. This means understanding how your breasts normally appear and feel.  It also means doing regular breast self-exams. Let your health care provider know about any changes, no matter how small.  If you are in your 20s or 30s, you should have a clinical breast exam (CBE) by a health care provider every 1-3 years as part of a regular health exam.  If you are 64 or older, have a CBE every year. Also consider having a breast X-ray (mammogram) every year.  If you have a family history of breast cancer, talk to your health care provider about genetic screening.  If you are at high risk for breast cancer, talk to your health care provider about having an MRI and a mammogram every year.  Breast cancer gene (BRCA) assessment is recommended for women who have family members with BRCA-related cancers. BRCA-related cancers include:  Breast.  Ovarian.  Tubal.  Peritoneal cancers.  Results of the assessment will determine the need for genetic counseling and BRCA1 and BRCA2 testing. Cervical Cancer Your health care provider may  recommend that you be screened regularly for cancer of the pelvic organs (ovaries, uterus, and vagina). This screening involves a pelvic examination, including checking for microscopic changes to the surface of your cervix (Pap test). You may be encouraged to have this screening done every 3 years, beginning at age 82.  For women ages 69-65, health care providers may recommend pelvic exams and Pap testing every 3 years, or they may recommend the Pap and pelvic exam, combined with testing for human papilloma virus (HPV), every 5 years. Some types of HPV increase your risk of cervical cancer. Testing for HPV may also be done on women of any age with unclear Pap test results.  Other health care providers may not recommend any screening for nonpregnant women who are considered low risk for pelvic cancer and who do not have symptoms. Ask your health care provider if a screening pelvic exam is right for you.  If you have had past treatment for cervical cancer or a condition that could lead to cancer, you need Pap tests and screening for cancer for at least 20 years after your treatment. If Pap tests have been discontinued, your risk factors (such as having a new sexual partner) need to be reassessed to determine if screening should resume. Some women have medical problems that increase the chance of getting cervical cancer. In these cases, your health care provider may recommend more frequent screening and Pap tests. Colorectal Cancer  This type of cancer can be detected and often prevented.  Routine colorectal cancer screening usually begins at 60 years of age and continues through 60 years of age.  Your health care provider may recommend screening at an earlier age if you have risk factors for colon cancer.  Your health care provider may also recommend using home test kits to check for hidden blood in the stool.  A small camera at the end of a tube can be used to examine your colon directly  (sigmoidoscopy or colonoscopy). This is done to check for the earliest forms of colorectal cancer.  Routine screening usually begins at age 71.  Direct examination of the colon should be repeated every 5-10 years through 60 years of age. However, you may need to be screened more often if early forms of precancerous polyps or small growths are found. Skin Cancer  Check your skin from head to toe regularly.  Tell your health care provider about any new moles or changes in moles, especially if there is a change in a mole's shape or color.  Also tell your health care provider if you have a mole that is larger than the size of a pencil eraser.  Always use sunscreen. Apply sunscreen liberally and repeatedly throughout the day.  Protect yourself by wearing long sleeves, pants, a wide-brimmed hat, and sunglasses whenever you are outside. HEART DISEASE, DIABETES, AND HIGH BLOOD PRESSURE   High blood pressure causes heart disease and increases the risk  of stroke. High blood pressure is more likely to develop in:  People who have blood pressure in the high end of the normal range (130-139/85-89 mm Hg).  People who are overweight or obese.  People who are African American.  If you are 77-22 years of age, have your blood pressure checked every 3-5 years. If you are 9 years of age or older, have your blood pressure checked every year. You should have your blood pressure measured twice--once when you are at a hospital or clinic, and once when you are not at a hospital or clinic. Record the average of the two measurements. To check your blood pressure when you are not at a hospital or clinic, you can use:  An automated blood pressure machine at a pharmacy.  A home blood pressure monitor.  If you are between 44 years and 53 years old, ask your health care provider if you should take aspirin to prevent strokes.  Have regular diabetes screenings. This involves taking a blood sample to check your  fasting blood sugar level.  If you are at a normal weight and have a low risk for diabetes, have this test once every three years after 60 years of age.  If you are overweight and have a high risk for diabetes, consider being tested at a younger age or more often. PREVENTING INFECTION  Hepatitis B  If you have a higher risk for hepatitis B, you should be screened for this virus. You are considered at high risk for hepatitis B if:  You were born in a country where hepatitis B is common. Ask your health care provider which countries are considered high risk.  Your parents were born in a high-risk country, and you have not been immunized against hepatitis B (hepatitis B vaccine).  You have HIV or AIDS.  You use needles to inject street drugs.  You live with someone who has hepatitis B.  You have had sex with someone who has hepatitis B.  You get hemodialysis treatment.  You take certain medicines for conditions, including cancer, organ transplantation, and autoimmune conditions. Hepatitis C  Blood testing is recommended for:  Everyone born from [birth year] through 1966.  Anyone with known risk factors for hepatitis C. Sexually transmitted infections (STIs)  You should be screened for sexually transmitted infections (STIs) including gonorrhea and chlamydia if:  You are sexually active and are younger than 60 years of age.  You are older than 60 years of age and your health care provider tells you that you are at risk for this type of infection.  Your sexual activity has changed since you were last screened and you are at an increased risk for chlamydia or gonorrhea. Ask your health care provider if you are at risk.  If you do not have HIV, but are at risk, it may be recommended that you take a prescription medicine daily to prevent HIV infection. This is called pre-exposure prophylaxis (PrEP). You are considered at risk if:  You are sexually active and do not regularly use condoms or  know the HIV status of your partner(s).  You take drugs by injection.  You are sexually active with a partner who has HIV. Talk with your health care provider about whether you are at high risk of being infected with HIV. If you choose to begin PrEP, you should first be tested for HIV. You should then be tested every 3 months for as long as you are taking PrEP.  PREGNANCY  If you are premenopausal and you may become pregnant, ask your health care provider about preconception counseling.  If you may become pregnant, take 400 to 800 micrograms (mcg) of folic acid every day.  If you want to prevent pregnancy, talk to your health care provider about birth control (contraception). OSTEOPOROSIS AND MENOPAUSE   Osteoporosis is a disease in which the bones lose minerals and strength with aging. This can result in serious bone fractures. Your risk for osteoporosis can be identified using a bone density scan.  If you are 36 years of age or older, or if you are at risk for osteoporosis and fractures, ask your health care provider if you should be screened.  Ask your health care provider whether you should take a calcium or vitamin D supplement to lower your risk for osteoporosis.  Menopause may have certain physical symptoms and risks.  Hormone replacement therapy may reduce some of these symptoms and risks. Talk to your health care provider about whether hormone replacement therapy is right for you.  HOME CARE INSTRUCTIONS   Schedule regular health, dental, and eye exams.  Stay current with your immunizations.   Do not use any tobacco products including cigarettes, chewing tobacco, or electronic cigarettes.  If you are pregnant, do not drink alcohol.  If you are breastfeeding, limit how much and how often you drink alcohol.  Limit alcohol intake to no more than 1 drink per day for nonpregnant women. One drink equals 12 ounces of beer, 5 ounces of wine, or 1 ounces of hard liquor.  Do  not use street drugs.  Do not share needles.  Ask your health care provider for help if you need support or information about quitting drugs.  Tell your health care provider if you often feel depressed.  Tell your health care provider if you have ever been abused or do not feel safe at home.   This information is not intended to replace advice given to you by your health care provider. Make sure you discuss any questions you have with your health care provider.   Document Released: 07/16/2010 Document Revised: 01/21/2014 Document Reviewed: 12/02/2012 Elsevier Interactive Patient Education [employer].

## 2014-12-12 NOTE — Progress Notes (Signed)
Pre visit review using our clinic review tool, if applicable. No additional management support is needed unless otherwise documented below in the visit note.  Chief Complaint  Patient presents with  . Annual Exam    fu vaginal      HPI: Patient  Stacey Young  59 y.o. comes in today for Preventive Health Care visit   And fy vaginal uti /nfection. Has form for work  Doing much better thinks all sx are gone   Health Maintenance  Topic Date Due  . Hepatitis C Screening  November 10, 1955  . PAP SMEAR  12/02/2013  . HIV Screening  12/12/2015 (Originally 06/07/1970)  . INFLUENZA VACCINE  08/15/2015  . MAMMOGRAM  04/24/2016  . TETANUS/TDAP  11/14/2019  . COLONOSCOPY  12/16/2019  . ZOSTAVAX  Completed   Health Maintenance Review LIFESTYLE:  Exercise:  Active when can walk dog gym ocass Tobacco/ETS:n Alcohol: per day  2 wine Sugar beverages:n Sleep:6-6.5 hours  Drug use: no  ROS:  GEN/ HEENT: No fever, significant weight changes sweats headaches vision problems hearing changes, CV/ PULM; No chest pain shortness of breath cough, syncope,edema  change in exercise tolerance. GI /GU: No adominal pain, vomiting, change in bowel habits. No blood in the stool. No significant GU symptoms. SKIN/HEME: ,no acute skin rashes suspicious lesions or bleeding. No lymphadenopathy, nodules, masses.  NEURO/ PSYCH:  No neurologic signs such as weakness numbness. No depression anxiety. IMM/ Allergy: No unusual infections.  Allergy .   REST of 12 system review negative except as per HPI   Past Medical History  Diagnosis Date  . Hay fever   . Fibroids     uterine  . Anemia   . IBS (irritable bowel syndrome)   . WBC decreased   . Hx of colonoscopy     utd  . Hx of varicella     as a child  . Back pain     Past Surgical History  Procedure Laterality Date  . Bladder repair w/ cesarean section  1987  . Gallbladder surgery  1977  . Vesicovaginal fistula closure w/ tah  2003    for fibroids  and endometriosis    Family History  Problem Relation Age of Onset  . Arthritis Mother   . Cancer Father     bladder  . Hyperlipidemia Father   . Migraines Sister   . Hyperlipidemia Sister     low wbc  . Dementia      older aunt uncle gm 54s and 83s  . Obstructive Sleep Apnea Mother     on CPAP    Social History   Social History  . Marital Status: Married    Spouse Name: N/A  . Number of Children: N/A  . Years of Education: N/A   Social History Main Topics  . Smoking status: Never Smoker   . Smokeless tobacco: None  . Alcohol Use: Yes  . Drug Use: No  . Sexual Activity: Not Asked   Other Topics Concern  . 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 amoxicillin (AMOXIL) 875 MG tablet Take 1 tablet (875 mg total) by mouth 2 (two) times daily. 20 tablet 0  . fluticasone (FLONASE) 50 MCG/ACT nasal spray Place 2 sprays into both nostrils daily. 16 g 0  . losartan-hydrochlorothiazide (HYZAAR) 100-12.5 MG per tablet Take 1 tablet by mouth daily. 30 tablet 0  . metroNIDAZOLE (FLAGYL) 500 MG tablet Take 1 tablet (500 mg total) by mouth 2 (two) times daily. (Patient not taking: Reported on 12/07/2014) 14 tablet 0   No facility-administered medications prior to visit.     EXAM:  BP 146/86 mmHg  Temp(Src) 98.3 F (36.8 C) (Oral)  Ht 5\' 4"  (1.626 m)  Wt 153 lb 12.8 oz (69.763 kg)  BMI 26.39 kg/m2  Body mass index is 26.39 kg/(m^2).  Physical Exam: Vital signs reviewed [id number] is a well-developed well-nourished alert cooperative    who appearsr stated age in no acute distress.  HEENT: normocephalic atraumatic , Eyes: PERRL  EOM's full, conjunctiva clear, Nares: paten,t no deformity discharge or tenderness., Ears: no deformity EAC's clear TMs with normal landmarks. Mouth: clear OP, no lesions, edema.  Moist mucous membranes. Dentition in adequate repair. NECK: supple without masses, thyromegaly or bruits. CHEST/PULM:  Clear to auscultation and percussion breath sounds equal no wheeze , rales or rhonchi. No chest wall deformities or tenderness.Breast: normal by inspection . No dimpling, discharge, masses, tenderness or discharge . CV: PMI is nondisplaced, S1 S2 no gallops, murmurs, rubs. Peripheral pulses are full without delay.No JVD .  ABDOMEN: Bowel sounds normal nontender  No guard or rebound, no hepato splenomegal no CVA tenderness.  No hernia. Extremtities:  No clubbing cyanosis or edema, no acute joint swelling or redness no focal atrophy NEURO:  Oriented x3, cranial nerves 3-12 appear to be intact, no obvious focal weakness,gait within normal limits no abnormal reflexes or asymmetrical SKIN: No acute rashes normal turgor, color, no bruising or petechiae. PSYCH: Oriented, good eye contact, no obvious depression anxiety, cognition and judgment appear normal. LN: no cervical axillary inguinal adenopathy Ext gun l vaginal exam no sig dc no  Lesions   Mostly pink mucosa   Lab Results  Component Value Date   WBC 3.9* 11/25/2014   HGB 13.6 11/25/2014   HCT 40.8 11/25/2014   PLT 230.0 11/25/2014   GLUCOSE 100* 11/25/2014   CHOL 185 11/25/2014   TRIG 52.0 11/25/2014   HDL 66.20 11/25/2014   LDLCALC 108* 11/25/2014   ALT 19 11/25/2014   AST 18 11/25/2014   NA 139 11/25/2014   K 3.6 11/25/2014   CL 104 11/25/2014   CREATININE 0.83 11/25/2014   BUN 19 11/25/2014   CO2 28 11/25/2014   TSH 1.33 11/25/2014   HGBA1C 5.7 12/09/2012   BP Readings from Last 3 Encounters:  12/12/14 146/86  12/07/14 142/92  11/28/14 140/90   Wt Readings from Last 3 Encounters:  12/12/14 153 lb 12.8 oz (69.763 kg)  12/07/14 153  lb 8 oz (69.627 kg)  11/28/14 153 lb (69.4 kg)    ASSESSMENT AND PLAN:  Discussed the following assessment and plan:  Visit for preventive health examination  Essential hypertension - reported at goal contniue meds  Medication management  Group A streptococcal infection - resolved   Vaginitis - exam better  Form completed for work  Patient Care Team: Burnis Medin, MD as PCP - General Patient Instructions  Vaginal exam looks normal  yealry check up  Or as needed. Goal bp is below 140/90  Health Maintenance, Female Adopting a healthy lifestyle and getting preventive care can go a long way to promote health and wellness. Talk with your health care provider about what schedule of regular examinations is right for you. This is a good chance for you to check in with your provider about disease prevention and staying healthy. In between checkups, there are plenty of things you can do on your own. Experts have done a lot of research about which lifestyle changes and preventive measures are most likely to keep you healthy. Ask your health care provider for more information. WEIGHT AND DIET  Eat a healthy diet  Be sure to include plenty of vegetables, fruits, low-fat dairy products, and lean protein.  Do not eat a lot of foods high in solid fats, added sugars, or salt.  Get regular exercise. This is one of the most important things you can do for your health.  Most adults should exercise for at least 150 minutes each week. The exercise should increase your heart rate and make you sweat (moderate-intensity exercise).  Most adults should also do strengthening exercises at least twice a week. This is in addition to the moderate-intensity exercise.  Maintain a healthy weight  Body mass index (BMI) is a measurement that can be used to identify possible weight problems. It estimates body fat based on height and weight. Your health care provider can help determine your BMI and help  you achieve or maintain a healthy weight.  For females 25 years of age and older:   A BMI below 18.5 is considered underweight.  A BMI of 18.5 to 24.9 is normal.  A BMI of 25 to 29.9 is considered overweight.  A BMI of 30 and above is considered obese.  Watch levels of cholesterol and blood lipids  You should start having your blood tested for lipids and cholesterol at 60 years of age, then have this test every 5 years.  You may need to have your cholesterol levels checked more often if:  Your lipid or cholesterol levels are high.  You are older than 60 years of age.  You are at high risk for heart disease.  CANCER SCREENING   Lung Cancer  Lung cancer screening is recommended for adults 65-63 years old who are at high risk for lung cancer because of a history of smoking.  A yearly low-dose CT scan of the lungs is recommended for people who:  Currently smoke.  Have quit within the past 15 years.  Have at least a 30-pack-year history of smoking. A pack year is smoking an average of one pack of cigarettes a day for 1 year.  Yearly screening should continue until it has been 15 years since you quit.  Yearly screening should stop if you develop a health problem that would prevent you from having lung cancer treatment.  Breast Cancer  Practice breast self-awareness. This means understanding how your breasts normally appear and feel.  It also means doing regular breast self-exams. Let your health care provider know about any changes, no matter how small.  If you are in your 20s or 30s, you should have a clinical breast exam (CBE) by a health care provider every 1-3 years as part of a regular health exam.  If you are 51 or older, have a CBE every year. Also consider having a breast X-ray (mammogram) every year.  If you have a family history of breast cancer, talk to your health care provider about genetic screening.  If you  are at high risk for breast cancer, talk to  your health care provider about having an MRI and a mammogram every year.  Breast cancer gene (BRCA) assessment is recommended for women who have family members with BRCA-related cancers. BRCA-related cancers include:  Breast.  Ovarian.  Tubal.  Peritoneal cancers.  Results of the assessment will determine the need for genetic counseling and BRCA1 and BRCA2 testing. Cervical Cancer Your health care provider may recommend that you be screened regularly for cancer of the pelvic organs (ovaries, uterus, and vagina). This screening involves a pelvic examination, including checking for microscopic changes to the surface of your cervix (Pap test). You may be encouraged to have this screening done every 3 years, beginning at age 7.  For women ages 29-65, health care providers may recommend pelvic exams and Pap testing every 3 years, or they may recommend the Pap and pelvic exam, combined with testing for human papilloma virus (HPV), every 5 years. Some types of HPV increase your risk of cervical cancer. Testing for HPV may also be done on women of any age with unclear Pap test results.  Other health care providers may not recommend any screening for nonpregnant women who are considered low risk for pelvic cancer and who do not have symptoms. Ask your health care provider if a screening pelvic exam is right for you.  If you have had past treatment for cervical cancer or a condition that could lead to cancer, you need Pap tests and screening for cancer for at least 20 years after your treatment. If Pap tests have been discontinued, your risk factors (such as having a new sexual partner) need to be reassessed to determine if screening should resume. Some women have medical problems that increase the chance of getting cervical cancer. In these cases, your health care provider may recommend more frequent screening and Pap tests. Colorectal Cancer  This type of cancer can be detected and often  prevented.  Routine colorectal cancer screening usually begins at 60 years of age and continues through 60 years of age.  Your health care provider may recommend screening at an earlier age if you have risk factors for colon cancer.  Your health care provider may also recommend using home test kits to check for hidden blood in the stool.  A small camera at the end of a tube can be used to examine your colon directly (sigmoidoscopy or colonoscopy). This is done to check for the earliest forms of colorectal cancer.  Routine screening usually begins at age 1.  Direct examination of the colon should be repeated every 5-10 years through 60 years of age. However, you may need to be screened more often if early forms of precancerous polyps or small growths are found. Skin Cancer  Check your skin from head to toe regularly.  Tell your health care provider about any new moles or changes in moles, especially if there is a change in a mole's shape or color.  Also tell your health care provider if you have a mole that is larger than the size of a pencil eraser.  Always use sunscreen. Apply sunscreen liberally and repeatedly throughout the day.  Protect yourself by wearing long sleeves, pants, a wide-brimmed hat, and sunglasses whenever you are outside. HEART DISEASE, DIABETES, AND HIGH BLOOD PRESSURE   High blood pressure causes heart disease and increases the risk of stroke. High blood pressure is more likely to develop in:  People who have blood pressure in the high end  of the normal range (130-139/85-89 mm Hg).  People who are overweight or obese.  People who are African American.  If you are 51-93 years of age, have your blood pressure checked every 3-5 years. If you are 74 years of age or older, have your blood pressure checked every year. You should have your blood pressure measured twice--once when you are at a hospital or clinic, and once when you are not at a hospital or clinic.  Record the average of the two measurements. To check your blood pressure when you are not at a hospital or clinic, you can use:  An automated blood pressure machine at a pharmacy.  A home blood pressure monitor.  If you are between 64 years and 67 years old, ask your health care provider if you should take aspirin to prevent strokes.  Have regular diabetes screenings. This involves taking a blood sample to check your fasting blood sugar level.  If you are at a normal weight and have a low risk for diabetes, have this test once every three years after 60 years of age.  If you are overweight and have a high risk for diabetes, consider being tested at a younger age or more often. PREVENTING INFECTION  Hepatitis B  If you have a higher risk for hepatitis B, you should be screened for this virus. You are considered at high risk for hepatitis B if:  You were born in a country where hepatitis B is common. Ask your health care provider which countries are considered high risk.  Your parents were born in a high-risk country, and you have not been immunized against hepatitis B (hepatitis B vaccine).  You have HIV or AIDS.  You use needles to inject street drugs.  You live with someone who has hepatitis B.  You have had sex with someone who has hepatitis B.  You get hemodialysis treatment.  You take certain medicines for conditions, including cancer, organ transplantation, and autoimmune conditions. Hepatitis C  Blood testing is recommended for:  Everyone born from [birth year] through 1966.  Anyone with known risk factors for hepatitis C. Sexually transmitted infections (STIs)  You should be screened for sexually transmitted infections (STIs) including gonorrhea and chlamydia if:  You are sexually active and are younger than 60 years of age.  You are older than 60 years of age and your health care provider tells you that you are at risk for this type of infection.  Your sexual activity  has changed since you were last screened and you are at an increased risk for chlamydia or gonorrhea. Ask your health care provider if you are at risk.  If you do not have HIV, but are at risk, it may be recommended that you take a prescription medicine daily to prevent HIV infection. This is called pre-exposure prophylaxis (PrEP). You are considered at risk if:  You are sexually active and do not regularly use condoms or know the HIV status of your partner(s).  You take drugs by injection.  You are sexually active with a partner who has HIV. Talk with your health care provider about whether you are at high risk of being infected with HIV. If you choose to begin PrEP, you should first be tested for HIV. You should then be tested every 3 months for as long as you are taking PrEP.  PREGNANCY   If you are premenopausal and you may become pregnant, ask your health care provider about preconception counseling.  If you may  become pregnant, take 400 to 800 micrograms (mcg) of folic acid every day.  If you want to prevent pregnancy, talk to your health care provider about birth control (contraception). OSTEOPOROSIS AND MENOPAUSE   Osteoporosis is a disease in which the bones lose minerals and strength with aging. This can result in serious bone fractures. Your risk for osteoporosis can be identified using a bone density scan.  If you are 1 years of age or older, or if you are at risk for osteoporosis and fractures, ask your health care provider if you should be screened.  Ask your health care provider whether you should take a calcium or vitamin D supplement to lower your risk for osteoporosis.  Menopause may have certain physical symptoms and risks.  Hormone replacement therapy may reduce some of these symptoms and risks. Talk to your health care provider about whether hormone replacement therapy is right for you.  HOME CARE INSTRUCTIONS   Schedule regular health, dental, and eye  exams.  Stay current with your immunizations.   Do not use any tobacco products including cigarettes, chewing tobacco, or electronic cigarettes.  If you are pregnant, do not drink alcohol.  If you are breastfeeding, limit how much and how often you drink alcohol.  Limit alcohol intake to no more than 1 drink per day for nonpregnant women. One drink equals 12 ounces of beer, 5 ounces of wine, or 1 ounces of hard liquor.  Do not use street drugs.  Do not share needles.  Ask your health care provider for help if you need support or information about quitting drugs.  Tell your health care provider if you often feel depressed.  Tell your health care provider if you have ever been abused or do not feel safe at home.   This information is not intended to replace advice given to you by your health care provider. Make sure you discuss any questions you have with your health care provider.   Document Released: 07/16/2010 Document Revised: 01/21/2014 Document Reviewed: 12/02/2012 Elsevier Interactive Patient Education 2016 [place] K. Kamron Portee M.D.

## 2015-01-20 ENCOUNTER — Other Ambulatory Visit: Payer: Self-pay | Admitting: Internal Medicine

## 2015-01-20 NOTE — Telephone Encounter (Signed)
Sent to the pharmacy by e-scribe. 

## 2015-08-15 ENCOUNTER — Encounter: Payer: Self-pay | Admitting: Internal Medicine

## 2015-08-15 ENCOUNTER — Ambulatory Visit (INDEPENDENT_AMBULATORY_CARE_PROVIDER_SITE_OTHER): Payer: 59 | Admitting: Internal Medicine

## 2015-08-15 VITALS — BP 136/78 | Temp 97.9°F | Wt 151.0 lb

## 2015-08-15 DIAGNOSIS — M25562 Pain in left knee: Secondary | ICD-10-CM

## 2015-08-15 NOTE — Progress Notes (Signed)
Pre visit review using our clinic review tool, if applicable. No additional management support is needed unless otherwise documented below in the visit note.  Chief Complaint  Patient presents with  . Knee Pain    Behind left knee.  Also on the front inner side.    HPI: Stacey Young 60 y.o.  sda  Complains about 6 weeks or more of waxing and waning left posterior knee pain and swelling. She doesn't remember specific injury however noted that she had significant pain when she would squat to pick something up. This was in the posterior area of her knee. No clicking or following although she states her knees of always given out since she was a young person. Has occasional left medial pain but mostly posterior and noted that she had swelling. The swelling has gone down but it is not resolved she has an occasional limp is able to walk her regular walking but her knee will ache at night in the posterior area. No specific history of arthritis no clicking popping and doesn't think it's in stable. Isn't sure of the cause and what she should do about it to prevent a long-term problem. ROS: See pertinent positives and negatives per HPI.  Past Medical History:  Diagnosis Date  . Anemia   . Back pain   . Fibroids    uterine  . Hay fever   . Hx of colonoscopy    utd  . Hx of transfusion    w gall baldder surgery  donates blood ( thus eg screen)  . Hx of varicella    as a child  . IBS (irritable bowel syndrome)   . WBC decreased     Family History  Problem Relation Age of Onset  . Arthritis Mother   . Cancer Father     bladder  . Hyperlipidemia Father   . Migraines Sister   . Hyperlipidemia Sister     low wbc  . Dementia      older aunt uncle gm 15s and 38s  . Obstructive Sleep Apnea Mother     on CPAP    Social History   Social History  . Marital status: Married    Spouse name: N/A  . Number of children: N/A  . Years of education: N/A   Social History Main Topics  .  Smoking status: Never Smoker  . Smokeless tobacco: None  . Alcohol use Yes  . Drug use: No  . Sexual activity: Not Asked   Other Topics Concern  . 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 fluticasone (FLONASE) 50 MCG/ACT nasal spray Place 2 sprays into both nostrils daily. 16 g 0  . losartan-hydrochlorothiazide (HYZAAR) 100-12.5 MG tablet TAKE 1 TABLET DAILY 90 tablet 2  . losartan-hydrochlorothiazide (HYZAAR) 100-12.5 MG per tablet Take 1 tablet by mouth daily. 30 tablet 0  . amoxicillin (AMOXIL) 875 MG tablet Take 1 tablet (875 mg total) by mouth 2 (two) times daily. 20 tablet 0   No facility-administered medications prior to visit.  EXAM:  BP 136/78 (BP Location: Right Arm, Patient Position: Sitting, Cuff Size: Normal)   Temp 97.9 F (36.6 C) (Oral)   Wt 151 lb (68.5 kg)   BMI 25.92 kg/m   Body mass index is 25.92 kg/m.  GENERAL: vitals reviewed and listed above, alert, oriented, appears well hydrated and in no acute distress HEENT: atraumatic, conjunctiva  clear, no obvious abnormalities on inspection of external nose and ears MS: moves all extremities without noticeable focal  Abnormality Left knee looks normal by inspection some mild posterior pain little more full than the right pain on flexion past 95 degrees appears to be stable drawer appears to be equal. Slight intermittent medial joint line pain. No crepitus. Muscle mass appears to be adequate. PSYCH: pleasant and cooperative, no obvious depression or anxiety  ASSESSMENT AND PLAN:  Discussed the following assessment and plan:  Posterior knee pain, left - Plan:  Ambulatory referral to Sports Medicine Hx swelling posterior poss cyst  Internal  Knee cause of pain  ?vs other  No alarm sx but advise consult for eval  Plan exercise or other intervention and to be able to continue exercise  -Patient advised to return or notify health care team  if symptoms worsen ,persist or new concerns arise.  Patient Instructions  Will be notified about  Sports medicine appt.  May be exercise program to help wioth this  ConsiderPopliteal cyst   As cause of sx ?  In the interim gentle exercise   Ok ice end of day if helpful       Knee Pain Knee pain is a very common symptom and can have many causes. Knee pain often goes away when you follow your health care provider's instructions for relieving pain and discomfort at home. However, knee pain can develop into a condition that needs treatment. Some conditions may include:  Arthritis caused by wear and tear (osteoarthritis).  Arthritis caused by swelling and irritation (rheumatoid arthritis or gout).  A cyst or growth in your knee.  An infection in your knee joint.  An injury that will not heal.  Damage, swelling, or irritation of the tissues that support your knee (torn ligaments or tendinitis). If your knee pain continues, additional tests may be ordered to diagnose your condition. Tests may include X-rays or other imaging studies of your knee. You may also need to have fluid removed from your knee. Treatment for ongoing knee pain depends on the cause, but treatment may include:  Medicines to relieve pain or swelling.  Steroid injections in your knee.  Physical therapy.  Surgery. HOME CARE INSTRUCTIONS  Take medicines only as directed by your health care provider.  Rest your knee and keep it raised (elevated) while you are resting.  Do not do things that cause or worsen pain.  Avoid high-impact activities or exercises, such as running, jumping rope, or doing jumping jacks.  Apply ice to the knee  area:  Put ice in a plastic bag.  Place a towel between your skin and the bag.  Leave the ice on for 20 minutes, 2-3 times a day.  Ask your health care provider if you should wear an elastic knee support.  Keep a pillow under your knee when you sleep.  Lose weight if you are overweight. Extra weight can put pressure on your knee.  Do not use any tobacco products, including cigarettes, chewing tobacco, or electronic cigarettes. If you need help quitting, ask your health care provider. Smoking may slow the healing of  any bone and joint problems that you may have. SEEK MEDICAL CARE IF:  Your knee pain continues, changes, or gets worse.  You have a fever along with knee pain.  Your knee buckles or locks up.  Your knee becomes more swollen. SEEK IMMEDIATE MEDICAL CARE IF:   Your knee joint feels hot to the touch.  You have chest pain or trouble breathing.   This information is not intended to replace advice given to you by your health care provider. Make sure you discuss any questions you have with your health care provider.   Document Released: 10/28/2006 Document Revised: 01/21/2014 Document Reviewed: 08/16/2013 Elsevier Interactive Patient Education 2016 [employer]  Generic Knee Exercises EXERCISES RANGE OF MOTION (ROM) AND STRETCHING EXERCISES These exercises may help you when beginning to rehabilitate your injury. Your symptoms may resolve with or without further involvement from your physician, physical therapist, or athletic trainer. While completing these exercises, remember:   Restoring tissue flexibility helps normal motion to return to the joints. This allows healthier, less painful movement and activity.  An effective stretch should be held for at least 30 seconds.  A stretch should never be painful. You should only feel a gentle lengthening or release in the stretched tissue. STRETCH - Knee Extension, Prone  Lie on your stomach on a firm surface, such as a  bed or countertop. Place your right / left knee and leg just beyond the edge of the surface. You may wish to place a towel under the far end of your right / left thigh for comfort.  Relax your leg muscles and allow gravity to straighten your knee. Your clinician may advise you to add an ankle weight if more resistance is helpful for you.  You should feel a stretch in the back of your right / left knee. Hold this position for __________ seconds. Repeat __________ times. Complete this stretch __________ times per day. * Your physician, physical therapist, or athletic trainer may ask you to add ankle weight to enhance your stretch.  RANGE OF MOTION - Knee Flexion, Active  Lie on your back with both knees straight. (If this causes back discomfort, bend your opposite knee, placing your foot flat on the floor.)  Slowly slide your heel back toward your buttocks until you feel a gentle stretch in the front of your knee or thigh.  Hold for __________ seconds. Slowly slide your heel back to the starting position. Repeat __________ times. Complete this exercise __________ times per day.  STRETCH - Quadriceps, Prone   Lie on your stomach on a firm surface, such as a bed or padded floor.  Bend your right / left knee and grasp your ankle. If you are unable to reach your ankle or pant leg, use a belt around your foot to lengthen your reach.  Gently pull your heel toward your buttocks. Your knee should not slide out to the side. You should feel a stretch in the front of your thigh and/or knee.  Hold this position for __________ seconds. Repeat __________ times. Complete this stretch __________ times per day.  STRETCH - Hamstrings, Supine   Lie on your back. Loop a belt or towel over the ball of your right / left foot.  Straighten your right / left knee and slowly pull on the belt to raise your leg. Do not allow the right / left knee to bend. Keep your opposite leg flat on the floor.  Raise the leg  until you feel a gentle stretch  behind your right / left knee or thigh. Hold this position for __________ seconds. Repeat __________ times. Complete this stretch __________ times per day.  STRENGTHENING EXERCISES These exercises may help you when beginning to rehabilitate your injury. They may resolve your symptoms with or without further involvement from your physician, physical therapist, or athletic trainer. While completing these exercises, remember:   Muscles can gain both the endurance and the strength needed for everyday activities through controlled exercises.  Complete these exercises as instructed by your physician, physical therapist, or athletic trainer. Progress the resistance and repetitions only as guided.  You may experience muscle soreness or fatigue, but the pain or discomfort you are trying to eliminate should never worsen during these exercises. If this pain does worsen, stop and make certain you are following the directions exactly. If the pain is still present after adjustments, discontinue the exercise until you can discuss the trouble with your clinician. STRENGTH - Quadriceps, Isometrics  Lie on your back with your right / left leg extended and your opposite knee bent.  Gradually tense the muscles in the front of your right / left thigh. You should see either your knee cap slide up toward your hip or increased dimpling just above the knee. This motion will push the back of the knee down toward the floor/mat/bed on which you are lying.  Hold the muscle as tight as you can without increasing your pain for __________ seconds.  Relax the muscles slowly and completely in between each repetition. Repeat __________ times. Complete this exercise __________ times per day.  STRENGTH - Quadriceps, Short Arcs   Lie on your back. Place a __________ inch towel roll under your knee so that the knee slightly bends.  Raise only your lower leg by tightening the muscles in the front of  your thigh. Do not allow your thigh to rise.  Hold this position for __________ seconds. Repeat __________ times. Complete this exercise __________ times per day.  OPTIONAL ANKLE WEIGHTS: Begin with ____________________, but DO NOT exceed ____________________. Increase in 1 pound/0.5 kilogram increments.  STRENGTH - Quadriceps, Straight Leg Raises  Quality counts! Watch for signs that the quadriceps muscle is working to insure you are strengthening the correct muscles and not "cheating" by substituting with healthier muscles.  Lay on your back with your right / left leg extended and your opposite knee bent.  Tense the muscles in the front of your right / left thigh. You should see either your knee cap slide up or increased dimpling just above the knee. Your thigh may even quiver.  Tighten these muscles even more and raise your leg 4 to 6 inches off the floor. Hold for __________ seconds.  Keeping these muscles tense, lower your leg.  Relax the muscles slowly and completely in between each repetition. Repeat __________ times. Complete this exercise __________ times per day.  STRENGTH - Hamstring, Curls  Lay on your stomach with your legs extended. (If you lay on a bed, your feet may hang over the edge.)  Tighten the muscles in the back of your thigh to bend your right / left knee up to 90 degrees. Keep your hips flat on the bed/floor.  Hold this position for __________ seconds.  Slowly lower your leg back to the starting position. Repeat __________ times. Complete this exercise __________ times per day.  OPTIONAL ANKLE WEIGHTS: Begin with ____________________, but DO NOT exceed ____________________. Increase in 1 pound/0.5 kilogram increments.  STRENGTH - Quadriceps, Squats  Stand in a  door frame so that your feet and knees are in line with the frame.  Use your hands for balance, not support, on the frame.  Slowly lower your weight, bending at the hips and knees. Keep your lower  legs upright so that they are parallel with the door frame. Squat only within the range that does not increase your knee pain. Never let your hips drop below your knees.  Slowly return upright, pushing with your legs, not pulling with your hands. Repeat __________ times. Complete this exercise __________ times per day.  STRENGTH - Quadriceps, Wall Slides  Follow guidelines for form closely. Increased knee pain often results from poorly placed feet or knees.  Lean against a smooth wall or door and walk your feet out 18-24 inches. Place your feet hip-width apart.  Slowly slide down the wall or door until your knees bend __________ degrees.* Keep your knees over your heels, not your toes, and in line with your hips, not falling to either side.  Hold for __________ seconds. Stand up to rest for __________ seconds in between each repetition. Repeat __________ times. Complete this exercise __________ times per day. * Your physician, physical therapist, or athletic trainer will alter this angle based on your symptoms and progress.   This information is not intended to replace advice given to you by your health care provider. Make sure you discuss any questions you have with your health care provider.   Document Released: 11/14/2004 Document Revised: 01/21/2014 Document Reviewed: 04/14/2008 Elsevier Interactive Patient Education 2016 Solon K. Keeon Zurn M.D.

## 2015-08-15 NOTE — Patient Instructions (Signed)
Will be notified about  Sports medicine appt.  May be exercise program to help wioth this  ConsiderPopliteal cyst   As cause of sx ?  In the interim gentle exercise   Ok ice end of day if helpful       Knee Pain Knee pain is a very common symptom and can have many causes. Knee pain often goes away when you follow your health care provider's instructions for relieving pain and discomfort at home. However, knee pain can develop into a condition that needs treatment. Some conditions may include:  Arthritis caused by wear and tear (osteoarthritis).  Arthritis caused by swelling and irritation (rheumatoid arthritis or gout).  A cyst or growth in your knee.  An infection in your knee joint.  An injury that will not heal.  Damage, swelling, or irritation of the tissues that support your knee (torn ligaments or tendinitis). If your knee pain continues, additional tests may be ordered to diagnose your condition. Tests may include X-rays or other imaging studies of your knee. You may also need to have fluid removed from your knee. Treatment for ongoing knee pain depends on the cause, but treatment may include:  Medicines to relieve pain or swelling.  Steroid injections in your knee.  Physical therapy.  Surgery. HOME CARE INSTRUCTIONS  Take medicines only as directed by your health care provider.  Rest your knee and keep it raised (elevated) while you are resting.  Do not do things that cause or worsen pain.  Avoid high-impact activities or exercises, such as running, jumping rope, or doing jumping jacks.  Apply ice to the knee area:  Put ice in a plastic bag.  Place a towel between your skin and the bag.  Leave the ice on for 20 minutes, 2-3 times a day.  Ask your health care provider if you should wear an elastic knee support.  Keep a pillow under your knee when you sleep.  Lose weight if you are overweight. Extra weight can put pressure on your knee.  Do not use any  tobacco products, including cigarettes, chewing tobacco, or electronic cigarettes. If you need help quitting, ask your health care provider. Smoking may slow the healing of any bone and joint problems that you may have. SEEK MEDICAL CARE IF:  Your knee pain continues, changes, or gets worse.  You have a fever along with knee pain.  Your knee buckles or locks up.  Your knee becomes more swollen. SEEK IMMEDIATE MEDICAL CARE IF:   Your knee joint feels hot to the touch.  You have chest pain or trouble breathing.   This information is not intended to replace advice given to you by your health care provider. Make sure you discuss any questions you have with your health care provider.   Document Released: 10/28/2006 Document Revised: 01/21/2014 Document Reviewed: 08/16/2013 Elsevier Interactive Patient Education 2016 [employer]  Generic Knee Exercises EXERCISES RANGE OF MOTION (ROM) AND STRETCHING EXERCISES These exercises may help you when beginning to rehabilitate your injury. Your symptoms may resolve with or without further involvement from your physician, physical therapist, or athletic trainer. While completing these exercises, remember:   Restoring tissue flexibility helps normal motion to return to the joints. This allows healthier, less painful movement and activity.  An effective stretch should be held for at least 30 seconds.  A stretch should never be painful. You should only feel a gentle lengthening or release in the stretched tissue. STRETCH - Knee Extension, Prone  Lie  on your stomach on a firm surface, such as a bed or countertop. Place your right / left knee and leg just beyond the edge of the surface. You may wish to place a towel under the far end of your right / left thigh for comfort.  Relax your leg muscles and allow gravity to straighten your knee. Your clinician may advise you to add an ankle weight if more resistance is helpful for you.  You should feel a  stretch in the back of your right / left knee. Hold this position for __________ seconds. Repeat __________ times. Complete this stretch __________ times per day. * Your physician, physical therapist, or athletic trainer may ask you to add ankle weight to enhance your stretch.  RANGE OF MOTION - Knee Flexion, Active  Lie on your back with both knees straight. (If this causes back discomfort, bend your opposite knee, placing your foot flat on the floor.)  Slowly slide your heel back toward your buttocks until you feel a gentle stretch in the front of your knee or thigh.  Hold for __________ seconds. Slowly slide your heel back to the starting position. Repeat __________ times. Complete this exercise __________ times per day.  STRETCH - Quadriceps, Prone   Lie on your stomach on a firm surface, such as a bed or padded floor.  Bend your right / left knee and grasp your ankle. If you are unable to reach your ankle or pant leg, use a belt around your foot to lengthen your reach.  Gently pull your heel toward your buttocks. Your knee should not slide out to the side. You should feel a stretch in the front of your thigh and/or knee.  Hold this position for __________ seconds. Repeat __________ times. Complete this stretch __________ times per day.  STRETCH - Hamstrings, Supine   Lie on your back. Loop a belt or towel over the ball of your right / left foot.  Straighten your right / left knee and slowly pull on the belt to raise your leg. Do not allow the right / left knee to bend. Keep your opposite leg flat on the floor.  Raise the leg until you feel a gentle stretch behind your right / left knee or thigh. Hold this position for __________ seconds. Repeat __________ times. Complete this stretch __________ times per day.  STRENGTHENING EXERCISES These exercises may help you when beginning to rehabilitate your injury. They may resolve your symptoms with or without further involvement from your  physician, physical therapist, or athletic trainer. While completing these exercises, remember:   Muscles can gain both the endurance and the strength needed for everyday activities through controlled exercises.  Complete these exercises as instructed by your physician, physical therapist, or athletic trainer. Progress the resistance and repetitions only as guided.  You may experience muscle soreness or fatigue, but the pain or discomfort you are trying to eliminate should never worsen during these exercises. If this pain does worsen, stop and make certain you are following the directions exactly. If the pain is still present after adjustments, discontinue the exercise until you can discuss the trouble with your clinician. STRENGTH - Quadriceps, Isometrics  Lie on your back with your right / left leg extended and your opposite knee bent.  Gradually tense the muscles in the front of your right / left thigh. You should see either your knee cap slide up toward your hip or increased dimpling just above the knee. This motion will push the back of  the knee down toward the floor/mat/bed on which you are lying.  Hold the muscle as tight as you can without increasing your pain for __________ seconds.  Relax the muscles slowly and completely in between each repetition. Repeat __________ times. Complete this exercise __________ times per day.  STRENGTH - Quadriceps, Short Arcs   Lie on your back. Place a __________ inch towel roll under your knee so that the knee slightly bends.  Raise only your lower leg by tightening the muscles in the front of your thigh. Do not allow your thigh to rise.  Hold this position for __________ seconds. Repeat __________ times. Complete this exercise __________ times per day.  OPTIONAL ANKLE WEIGHTS: Begin with ____________________, but DO NOT exceed ____________________. Increase in 1 pound/0.5 kilogram increments.  STRENGTH - Quadriceps, Straight Leg Raises  Quality  counts! Watch for signs that the quadriceps muscle is working to insure you are strengthening the correct muscles and not "cheating" by substituting with healthier muscles.  Lay on your back with your right / left leg extended and your opposite knee bent.  Tense the muscles in the front of your right / left thigh. You should see either your knee cap slide up or increased dimpling just above the knee. Your thigh may even quiver.  Tighten these muscles even more and raise your leg 4 to 6 inches off the floor. Hold for __________ seconds.  Keeping these muscles tense, lower your leg.  Relax the muscles slowly and completely in between each repetition. Repeat __________ times. Complete this exercise __________ times per day.  STRENGTH - Hamstring, Curls  Lay on your stomach with your legs extended. (If you lay on a bed, your feet may hang over the edge.)  Tighten the muscles in the back of your thigh to bend your right / left knee up to 90 degrees. Keep your hips flat on the bed/floor.  Hold this position for __________ seconds.  Slowly lower your leg back to the starting position. Repeat __________ times. Complete this exercise __________ times per day.  OPTIONAL ANKLE WEIGHTS: Begin with ____________________, but DO NOT exceed ____________________. Increase in 1 pound/0.5 kilogram increments.  STRENGTH - Quadriceps, Squats  Stand in a door frame so that your feet and knees are in line with the frame.  Use your hands for balance, not support, on the frame.  Slowly lower your weight, bending at the hips and knees. Keep your lower legs upright so that they are parallel with the door frame. Squat only within the range that does not increase your knee pain. Never let your hips drop below your knees.  Slowly return upright, pushing with your legs, not pulling with your hands. Repeat __________ times. Complete this exercise __________ times per day.  STRENGTH - Quadriceps, Wall Slides    Follow guidelines for form closely. Increased knee pain often results from poorly placed feet or knees.  Lean against a smooth wall or door and walk your feet out 18-24 inches. Place your feet hip-width apart.  Slowly slide down the wall or door until your knees bend __________ degrees.* Keep your knees over your heels, not your toes, and in line with your hips, not falling to either side.  Hold for __________ seconds. Stand up to rest for __________ seconds in between each repetition. Repeat __________ times. Complete this exercise __________ times per day. * Your physician, physical therapist, or athletic trainer will alter this angle based on your symptoms and progress.   This information is  not intended to replace advice given to you by your health care provider. Make sure you discuss any questions you have with your health care provider.   Document Released: 11/14/2004 Document Revised: 01/21/2014 Document Reviewed: 04/14/2008 Elsevier Interactive Patient Education [employer].

## 2015-08-28 ENCOUNTER — Ambulatory Visit: Payer: 59 | Admitting: Family Medicine

## 2015-09-09 NOTE — Progress Notes (Signed)
[name] [address], [place] [address] Phone: [phone] Subjective:    I'm seeing this patient by the request  of:  Lottie Dawson, MD   CC: Left knee pain   [id number]  Stacey Young is a 60 y.o. female coming in with complaint of Left knee pain.  Patient states 8-10 weeks of intermittent pain, Mostly posterior, no injury, Pain with stairs and squatting. Has had swelling no radiation of pain, occasional limp, can ache at night but does not wake her up at night. Patient states that the pain has moved more to the medial aspect of the knee. Certain activities such as going up or downstairs seem to give her severe pain. Sometimes has some very mild instability. Patient feels it is not getting better and if anything getting worse.     Past Medical History:  Diagnosis Date  . Anemia   . Back pain   . Fibroids    uterine  . Hay fever   . Hx of colonoscopy    utd  . Hx of transfusion    w gall baldder surgery  donates blood ( thus eg screen)  . Hx of varicella    as a child  . IBS (irritable bowel syndrome)   . WBC decreased    Past Surgical History:  Procedure Laterality Date  . BLADDER REPAIR W/ CESAREAN SECTION  1987  . [place]  . VESICOVAGINAL FISTULA CLOSURE W/ TAH  2003   for fibroids and endometriosis   Social History   Social History  . Marital status: Married    Spouse name: N/A  . Number of children: N/A  . Years of education: N/A   Social History Main Topics  . Smoking status: Never Smoker  . Smokeless tobacco: None  . Alcohol use Yes  . Drug use: No  . Sexual activity: Not Asked   Other Topics Concern  . 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 Arthritis Mother   . Cancer Father     bladder  . Hyperlipidemia Father   . Migraines Sister   . Hyperlipidemia Sister     low wbc  . Dementia      older aunt uncle gm 77s and 18s  . Obstructive Sleep Apnea Mother     on CPAP    Past medical history, social, surgical and family history all reviewed in electronic medical record.  No pertanent information unless stated regarding to the chief complaint.   Review of Systems: No headache, visual changes, nausea, vomiting, diarrhea, constipation, dizziness, abdominal pain, skin rash, fevers, chills, night sweats, weight loss, swollen lymph nodes, body aches, joint swelling, muscle aches, chest pain, shortness of breath, mood changes.   Objective  Blood pressure 130/72, pulse 67, height 5\' 4"  (1.626 m), weight 152 lb (68.9 kg), SpO2 98 %.  General: No apparent distress alert and oriented x3 mood and affect normal, dressed appropriately.  HEENT: Pupils equal, extraocular movements intact  Respiratory: Patient's speak in  full sentences and does not appear short of breath  Cardiovascular: No lower extremity edema, non tender, no erythema  Skin: Warm dry intact with no signs of infection or rash on extremities or on axial skeleton.  Abdomen: Soft nontender  Neuro: Cranial nerves II through XII are intact, neurovascularly intact in all extremities with 2+ DTRs and 2+ pulses.  Lymph: No lymphadenopathy of posterior or anterior cervical chain or axillae bilaterally.  Gait Very mild antalgic gait MSK:  Non tender with full range of motion and good stability and symmetric strength and tone of shoulders, elbows, wrist, hip, and ankles bilaterally.  Knee: Left Normal to inspection with no erythema or effusion or obvious bony abnormalities. Severely tender to palpation over the medial joint line ROM full in flexion and extension and lower leg  rotation. Ligaments with solid consistent endpoints including ACL, PCL, LCL, MCL. Positive McMurray's and Thessaly painful patellar compression. Patellar glide with mild crepitus. Patellar and quadriceps tendons unremarkable. Hamstring and quadriceps strength is normal.  Contralateral knee also has some patellar grinding but otherwise unremarkable.  MSK US performed of: Left This study was ordered, performed, and interpreted by Charlann Boxer D.O.  Knee: All structures visualized. Large anterior medial to posterior medial meniscus tear. This involves greater than 65% of the meniscus itself. Hypoechoic changes with mild displacement noted. Patient also has what appears to be a sprain of the MCL with increasing Doppler flow and hypoechoic changes but no gapping on dynamic V. Patellar Tendon unremarkable on long and transverse views without effusion.   IMPRESSION:  Large medial meniscal tear with mild displacement and MCL sprain     Impression and Recommendations:     This case required medical decision making of moderate complexity.      Note: This dictation was prepared with Dragon dictation along with smaller phrase technology. Any transcriptional errors that result from this process are unintentional.

## 2015-09-11 ENCOUNTER — Other Ambulatory Visit: Payer: Self-pay

## 2015-09-11 ENCOUNTER — Encounter: Payer: Self-pay | Admitting: Family Medicine

## 2015-09-11 ENCOUNTER — Ambulatory Visit (INDEPENDENT_AMBULATORY_CARE_PROVIDER_SITE_OTHER): Payer: 59 | Admitting: Family Medicine

## 2015-09-11 ENCOUNTER — Ambulatory Visit (INDEPENDENT_AMBULATORY_CARE_PROVIDER_SITE_OTHER)
Admission: RE | Admit: 2015-09-11 | Discharge: 2015-09-11 | Disposition: A | Discharge status: Home / Self Care | Payer: 59 | Source: Ambulatory Visit | Attending: Family Medicine | Admitting: Family Medicine

## 2015-09-11 VITALS — BP 130/72 | HR 67 | Ht 64.0 in | Wt 152.0 lb

## 2015-09-11 DIAGNOSIS — S83249A Other tear of medial meniscus, current injury, unspecified knee, initial encounter: Secondary | ICD-10-CM | POA: Insufficient documentation

## 2015-09-11 DIAGNOSIS — S83242A Other tear of medial meniscus, current injury, left knee, initial encounter: Secondary | ICD-10-CM | POA: Diagnosis not present

## 2015-09-11 DIAGNOSIS — M25562 Pain in left knee: Secondary | ICD-10-CM

## 2015-09-11 DIAGNOSIS — M222X1 Patellofemoral disorders, right knee: Secondary | ICD-10-CM

## 2015-09-11 DIAGNOSIS — S83419A Sprain of medial collateral ligament of unspecified knee, initial encounter: Secondary | ICD-10-CM | POA: Insufficient documentation

## 2015-09-11 DIAGNOSIS — S83412A Sprain of medial collateral ligament of left knee, initial encounter: Secondary | ICD-10-CM | POA: Diagnosis not present

## 2015-09-11 DIAGNOSIS — M222X2 Patellofemoral disorders, left knee: Secondary | ICD-10-CM

## 2015-09-11 MED ORDER — DICLOFENAC SODIUM 2 % TD SOLN: 2.0000 "application " | Freq: Two times a day (BID) | TRANSDERMAL | 3 refills | Status: DC

## 2015-09-11 NOTE — Assessment & Plan Note (Signed)
Discussed with patient at great length. Patient does have a medial meniscal tear. We discussed icing regimen, home exercises, which activities to do an which was potentially avoid. Patient will continue to stay active. We discussed avoiding any twisting motions. Topical anti-inflammatories given. Patient will consider an over-the-counter brace. If worsening symptoms at follow-up consider injection and formal physical therapy. Tendon.

## 2015-09-11 NOTE — Assessment & Plan Note (Signed)
Patellofemoral Syndrome  Reviewed anatomy using anatomical model and how PFS occurs.  Given rehab exercises handout for VMO, hip abductors, core, entire kinetic chain including proprioception exercises including cone touches, step downs, hip elevations and turn outs.  Could benefit from PT, regular exercise, upright biking, and a PFS knee brace to assist with tracking abnormalities.  

## 2015-09-11 NOTE — Patient Instructions (Signed)
Good to see you.  You have a medical meniscal tear and a MCL sprain.  Ice 20 minutes 2 times daily. Usually after activity and before bed. Exercises 3 times a week.  pennsaid pinkie amount topically 2 times daily as needed.  Avoid any twisting motions for now or deep squats.  Vitamin D 2000 IU daily can help with muscle strength and endurance.  No twisting or jumping for now.  See me again in 3-4 weeks and we will make sure you are doing better.

## 2015-10-09 ENCOUNTER — Ambulatory Visit: Payer: 59 | Admitting: Family Medicine

## 2015-10-10 ENCOUNTER — Other Ambulatory Visit (INDEPENDENT_AMBULATORY_CARE_PROVIDER_SITE_OTHER): Payer: 59

## 2015-10-10 DIAGNOSIS — Z0000 Encounter for general adult medical examination without abnormal findings: Secondary | ICD-10-CM

## 2015-10-10 LAB — BASIC METABOLIC PANEL
BUN: 18 mg/dL (ref 6–23)
CO2: 30 mEq/L (ref 19–32)
CREATININE: 0.74 mg/dL (ref 0.40–1.20)
Calcium: 8.8 mg/dL (ref 8.4–10.5)
Chloride: 105 mEq/L (ref 96–112)
GFR: 102.49 mL/min (ref >60.00)
Glucose, Bld: 95 mg/dL (ref 70–99)
Potassium: 3.8 mEq/L (ref 3.5–5.1)
SODIUM: 141 meq/L (ref 135–145)

## 2015-10-10 LAB — HEPATIC FUNCTION PANEL
ALBUMIN: 4 g/dL (ref 3.5–5.2)
ALT: 19 U/L (ref 0–35)
AST: 18 U/L (ref 0–37)
Alkaline Phosphatase: 62 U/L (ref 39–117)
Bilirubin, Direct: 0.1 mg/dL (ref 0.0–0.3)
TOTAL PROTEIN: 7 g/dL (ref 6.0–8.3)
Total Bilirubin: 0.5 mg/dL (ref 0.2–1.2)

## 2015-10-10 LAB — CBC WITH DIFFERENTIAL/PLATELET
Basophils Absolute: 0 10*3/uL (ref 0.0–0.1)
Basophils Relative: 0.7 % (ref 0.0–3.0)
EOS ABS: 0.1 10*3/uL (ref 0.0–0.7)
Eosinophils Relative: 1.6 % (ref 0.0–5.0)
HEMATOCRIT: 39.4 % (ref 36.0–46.0)
Hemoglobin: 13.6 g/dL (ref 12.0–15.0)
Lymphocytes Relative: 55.9 % — ABNORMAL HIGH (ref 12.0–46.0)
Lymphs Abs: 1.9 10*3/uL (ref 0.7–4.0)
MCHC: 34.5 g/dL (ref 30.0–36.0)
MCV: 89.1 fl (ref 78.0–100.0)
MONOS PCT: 11.9 % (ref 3.0–12.0)
Monocytes Absolute: 0.4 10*3/uL (ref 0.1–1.0)
NEUTROS PCT: 29.9 % — AB (ref 43.0–77.0)
Neutro Abs: 1 10*3/uL — ABNORMAL LOW (ref 1.4–7.7)
Platelets: 229 10*3/uL (ref 150.0–400.0)
RBC: 4.42 Mil/uL (ref 3.87–5.11)
RDW: 12.9 % (ref 11.5–15.5)
WBC: 3.3 10*3/uL — ABNORMAL LOW (ref 4.0–10.5)

## 2015-10-10 LAB — TSH: TSH: 1.57 u[IU]/mL (ref 0.35–4.50)

## 2015-10-10 LAB — LIPID PANEL
Cholesterol: 178 mg/dL (ref 0–200)
HDL: 66.8 mg/dL (ref >39.00)
LDL Cholesterol: 100 mg/dL — ABNORMAL HIGH (ref 0–99)
NONHDL: 111.12
Total CHOL/HDL Ratio: 3
Triglycerides: 58 mg/dL (ref 0.0–149.0)
VLDL: 11.6 mg/dL (ref 0.0–40.0)

## 2015-10-12 NOTE — Progress Notes (Signed)
[name] [address], [place] [address] Phone: [phone] Subjective:     CC: Left knee pain f/u  [id number]  Stacey Young is a 60 y.o. female coming in with complaint of Left knee pain.  Found to have meniscal tear. Patient states that she is doing approximately 60% better. Still has times when she makes certain movement as a sharp pain. Hasn't been fairly noncompliant with the medications icing or home exercises. States that she has been traveling. Still though thinks he is doing better. Does not like the topical anti-inflammatories.      Past Medical History:  Diagnosis Date  . Anemia   . Back pain   . Fibroids    uterine  . Hay fever   . Hx of colonoscopy    utd  . Hx of transfusion    w gall baldder surgery  donates blood ( thus eg screen)  . Hx of varicella    as a child  . IBS (irritable bowel syndrome)   . WBC decreased    Past Surgical History:  Procedure Laterality Date  . BLADDER REPAIR W/ CESAREAN SECTION  1987  . [place]  . VESICOVAGINAL FISTULA CLOSURE W/ TAH  2003   for fibroids and endometriosis   Social History   Social History  . Marital status: Married    Spouse name: N/A  . Number of children: N/A  . Years of education: N/A   Social History Main Topics  . Smoking status: Never Smoker  . Smokeless tobacco: Not on file  . Alcohol use Yes  . Drug use: No  . Sexual activity: Not on file   Other Topics Concern  . 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 Arthritis Mother   . Cancer Father     bladder  . Hyperlipidemia Father     . Migraines Sister   . Hyperlipidemia Sister     low wbc  . Dementia      older aunt uncle gm 20s and 43s  . Obstructive Sleep Apnea Mother     on CPAP    Past medical history, social, surgical and family history all reviewed in electronic medical record.  No pertanent information unless stated regarding to the chief complaint.   Review of Systems: No headache, visual changes, nausea, vomiting, diarrhea, constipation, dizziness, abdominal pain, skin rash, fevers, chills, night sweats, weight loss, swollen lymph nodes, body aches, joint swelling, muscle aches, chest pain, shortness of breath, mood changes.   Objective  Blood pressure 134/86, pulse 60, weight 152 lb 12.8 oz (69.3 kg).  General: No apparent distress alert and oriented x3 mood and affect normal, dressed appropriately.  HEENT: Pupils equal, extraocular movements intact  Respiratory: Patient's speak in full sentences and does not appear short of breath  Cardiovascular: No lower extremity edema, non tender, no erythema  Skin: Warm dry intact with no signs of infection or rash on extremities or on axial skeleton.  Abdomen: Soft nontender  Neuro: Cranial nerves II through XII are intact, neurovascularly intact in all extremities with 2+ DTRs and 2+ pulses.  Lymph: No lymphadenopathy of posterior or anterior cervical chain or axillae bilaterally.  Gait Very mild antalgic gait MSK:  Non tender with full range of motion and good stability and symmetric strength and tone of shoulders, elbows, wrist, hip, and ankles bilaterally.  Knee: Left Normal to inspection with no erythema or effusion or obvious bony abnormalities. Minimal tenderness over the medial joint line still remaining ROM full in flexion and extension and lower leg rotation. Ligaments with solid consistent endpoints including ACL, PCL, LCL, MCL. Positive McMurray's and Thessalystill present painful patellar compression. Patellar glide with mild crepitus. Patellar  and quadriceps tendons unremarkable. Hamstring and quadriceps strength is normal.  Contralateral knee also has some patellar grinding but otherwise unremarkable. Moderate improvement from previous exam       Impression and Recommendations:     This case required medical decision making of moderate complexity.      Note: This dictation was prepared with Dragon dictation along with smaller phrase technology. Any transcriptional errors that result from this process are unintentional.

## 2015-10-13 ENCOUNTER — Ambulatory Visit (INDEPENDENT_AMBULATORY_CARE_PROVIDER_SITE_OTHER): Payer: 59 | Admitting: Family Medicine

## 2015-10-13 DIAGNOSIS — S83242D Other tear of medial meniscus, current injury, left knee, subsequent encounter: Secondary | ICD-10-CM

## 2015-10-13 MED ORDER — MELOXICAM 15 MG PO TABS: 15.0000 mg | ORAL_TABLET | Freq: Every day | ORAL | 0 refills | Status: DC

## 2015-10-13 NOTE — Assessment & Plan Note (Signed)
Improvement at this time. Encourage patient to continue to do the exercises regularly. I think the patient will do well with conservative therapy. If worsening symptoms she will need to consider formal physical therapy and injection. Patient is traveling so we'll not be able to do physical therapy at this time.

## 2015-10-13 NOTE — Patient Instructions (Signed)
Great to see you  Stacey Young is your friend.  Stay active and ok to bike Avoid twisting Meloxicam daily for 10 days then as needed See me again in 4-6 weeks

## 2015-10-17 ENCOUNTER — Other Ambulatory Visit: Payer: Self-pay | Admitting: Internal Medicine

## 2015-10-19 NOTE — Telephone Encounter (Signed)
Sent to the pharmacy by e-scribe for 90 days.  Pt has upcoming cpx on 10/23/15.

## 2015-10-20 NOTE — Progress Notes (Signed)
Pre visit review using our clinic review tool, if applicable. No additional management support is needed unless otherwise documented below in the visit note.  Chief Complaint  Patient presents with  . Annual Exam    HPI: Patient  Stacey Young  60 y.o. comes in today for Preventive Health Care visit  Has form to sign for work  bp  Not checking  taking med no se  Torn meniscus getting better     Not eercising yet time  Not taking  advil aleve.    Health Maintenance  Topic Date Due  . Hepatitis C Screening  10/21/2016 (Originally 13-Jun-1955)  . HIV Screening  10/21/2016 (Originally 06/07/1970)  . MAMMOGRAM  04/24/2016  . TETANUS/TDAP  11/14/2019  . COLONOSCOPY  12/16/2019  . INFLUENZA VACCINE  Completed  . ZOSTAVAX  Completed   Health Maintenance Review LIFESTYLE:  Exercise:   Swims   Walks miles  Tobacco/ETS:n Alcohol:  2 per night Sugar beverages:ocass zero Sleep:7 hours Drug use: no HH of 2 Work: travels at times  ccl   Family losses this year and illnesses .     ROS:  Off and on congestion like going to get cold but no fever  GEN/ HEENT: No fever, significant weight changes sweats headaches vision problems hearing changes, CV/ PULM; No chest pain shortness of breath cough, syncope,edema  change in exercise tolerance. GI /GU: No adominal pain, vomiting, change in bowel habits. No blood in the stool. No significant GU symptoms. SKIN/HEME: ,no acute skin rashes suspicious lesions or bleeding. No lymphadenopathy, nodules, masses.  NEURO/ PSYCH:  No neurologic signs such as weakness numbness. No depression anxiety. IMM/ Allergy: No unusual infections.  Allergy .   REST of 12 system review negative except as per HPI   Past Medical History:  Diagnosis Date  . Anemia   . Back pain   . Fibroids    uterine  . Hay fever   . Hx of colonoscopy    utd  . Hx of transfusion    w gall baldder surgery  donates blood ( thus eg screen)  . Hx of varicella    as a child  .  IBS (irritable bowel syndrome)   . WBC decreased     Past Surgical History:  Procedure Laterality Date  . BLADDER REPAIR W/ CESAREAN SECTION  1987  . [place]  . VESICOVAGINAL FISTULA CLOSURE W/ TAH  2003   for fibroids and endometriosis   Sis and mom both have had on going low WBC   Not from illness Family History  Problem Relation Age of Onset  . Arthritis Mother   . Cancer Father     bladder  . Hyperlipidemia Father   . Migraines Sister   . Hyperlipidemia Sister     low wbc  . Dementia      older aunt uncle gm 70s and 68s  . Obstructive Sleep Apnea Mother     on CPAP    Social History   Social History  . Marital status: Married    Spouse name: N/A  . Number of children: N/A  . Years of education: N/A   Social History Main Topics  . Smoking status: Never Smoker  . Smokeless tobacco: Never Used  . Alcohol use Yes  . Drug use: No  . Sexual activity: Not Asked   Other Topics Concern  . 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 fluticasone (FLONASE) 50 MCG/ACT nasal spray Place 2 sprays into both nostrils daily. 16 g 0  . losartan-hydrochlorothiazide (HYZAAR) 100-12.5 MG tablet TAKE 1 TABLET DAILY 90 tablet 0  . meloxicam (MOBIC) 15 MG tablet Take 1 tablet (15 mg total) by mouth daily. 30 tablet 0  . RESTASIS 0.05 % ophthalmic emulsion Use both eyes twice daily    . Diclofenac Sodium (PENNSAID) 2 % SOLN Place 2 application onto the skin 2 (two) times daily. 112 g 3   No facility-administered medications prior to visit.      EXAM:  BP 140/80   Temp 97.8 F (36.6 C) (Oral)   Ht 5\' 4"  (1.626 m)    Wt 152 lb 12.8 oz (69.3 kg)   BMI 26.23 kg/m   Body mass index is 26.23 kg/m.  Physical Exam: Vital signs reviewed [id number] is a well-developed well-nourished alert cooperative    who appearsr stated age in no acute distress.  HEENT: normocephalic atraumatic , Eyes: PERRL EOM's full, conjunctiva clear, Nares: paten,t no deformity discharge or tenderness., Ears: no deformity EAC's clear TMs with normal landmarks. Mouth: clear OP, no lesions, edema.  Moist mucous membranes. Dentition in adequate repair. NECK: supple without masses, thyromegaly or bruits. CHEST/PULM:  Clear to auscultation and percussion breath sounds equal no wheeze , rales or rhonchi. No chest wall deformities or tenderness. CV: PMI is nondisplaced, S1 S2 no gallops, murmurs, rubs. Peripheral pulses are full without delay.No JVD . Breast: normal by inspection . No dimpling, discharge, masses, tenderness or discharge . ABDOMEN: Bowel sounds normal nontender  No guard or rebound, no hepato splenomegal no CVA tenderness.  No hernia.well healed abd scar  Extremtities:  No clubbing cyanosis or edema, no acute joint swelling or redness no focal atrophy NEURO:  Oriented x3, cranial nerves 3-12 appear to be intact, no obvious focal weakness,gait within normal limits no abnormal reflexes or asymmetrical SKIN: No acute rashes normal turgor, color, no bruising or petechiae. PSYCH: Oriented, good eye contact, no obvious depression anxiety, cognition and judgment appear normal. LN: no cervical axillary inguinal adenopathy  Lab Results  Component Value Date   WBC 3.3 (L) 10/10/2015   HGB 13.6 10/10/2015   HCT 39.4 10/10/2015   PLT 229.0 10/10/2015   GLUCOSE 95 10/10/2015   CHOL 178 10/10/2015   TRIG 58.0 10/10/2015   HDL 66.80 10/10/2015   LDLCALC 100 (H) 10/10/2015   ALT 19 10/10/2015   AST 18 10/10/2015   NA 141 10/10/2015   K 3.8 10/10/2015   CL 105 10/10/2015   CREATININE 0.74 10/10/2015   BUN 18 10/10/2015   CO2 30  10/10/2015   TSH 1.57 10/10/2015   HGBA1C 5.7 12/09/2012   BP Readings from Last 3 Encounters:  10/23/15 140/80  10/13/15 134/86  09/11/15 130/72   Wt Readings from Last 3 Encounters:  10/23/15 152 lb 12.8 oz (69.3 kg)  10/13/15 152 lb 12.8 oz (69.3 kg)  09/11/15 152 lb (68.9 kg)     ASSESSMENT AND PLAN:  Discussed the following assessment and plan:  Visit for preventive health examination  Essential hypertension  Leukopenia, unspecified type - fam hx  sis and mom no sx  neutropenia   no infections etc  follow  lower than last year . may not be clin significant nl hg platelets  Medication management  Need for prophylactic vaccination and inoculation against influenza - Plan: Flu Vaccine QUAD 36+ mos PF IM (Fluarix & Fluzone Quad PF)  Patient Care Team: Burnis Medin, MD as PCP - General Patient Instructions  Continue lifestyle intervention healthy eating and exercise . Get lab only cbcdiff in 3 months following  Low blood count but may be familial issue.  Call [facility] to get confirmation when  Next colonscopy due  Last on in 2011   Continue meds .   If all ok then yearly check up .    Standley Brooking. Devarious Pavek M.D.

## 2015-10-23 ENCOUNTER — Encounter: Payer: Self-pay | Admitting: Internal Medicine

## 2015-10-23 ENCOUNTER — Ambulatory Visit (INDEPENDENT_AMBULATORY_CARE_PROVIDER_SITE_OTHER): Payer: 59 | Admitting: Internal Medicine

## 2015-10-23 VITALS — BP 140/80 | Temp 97.8°F | Ht 64.0 in | Wt 152.8 lb

## 2015-10-23 DIAGNOSIS — Z79899 Other long term (current) drug therapy: Secondary | ICD-10-CM | POA: Diagnosis not present

## 2015-10-23 DIAGNOSIS — I10 Essential (primary) hypertension: Secondary | ICD-10-CM

## 2015-10-23 DIAGNOSIS — Z23 Encounter for immunization: Secondary | ICD-10-CM

## 2015-10-23 DIAGNOSIS — Z0000 Encounter for general adult medical examination without abnormal findings: Secondary | ICD-10-CM

## 2015-10-23 DIAGNOSIS — D72819 Decreased white blood cell count, unspecified: Secondary | ICD-10-CM | POA: Diagnosis not present

## 2015-10-23 NOTE — Patient Instructions (Signed)
Continue lifestyle intervention healthy eating and exercise . Get lab only cbcdiff in 3 months following  Low blood count but may be familial issue.  Call [facility] to get confirmation when  Next colonscopy due  Last on in 2011   Continue meds .   If all ok then yearly check up .

## 2015-11-25 NOTE — Progress Notes (Signed)
[name] [address] [place], [place] [address] Phone: [phone] Subjective:     CC: Left knee pain f/u  [id number]  Stacey Young is a 60 y.o. female coming in with complaint of Left knee pain.  Found to have meniscal tear. 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 Had been responding fairly well to conservative therapy. Patient states The pain is improved but unfortunate she continues to have more of a clicking or instability feeling of the knee. Feels like this is happening more frequently. Patient states that the pain overall has improved. No swelling. Has not fallen from it. Still does not feel normal. All the discomfort or sensation seems to be around the kneecap.      Past Medical History:  Diagnosis Date  . Anemia   . Back pain   . Fibroids    uterine  . Hay fever   . Hx of colonoscopy    utd  . Hx of transfusion    w gall baldder surgery  donates blood ( thus eg screen)  . Hx of varicella    as a child  . IBS (irritable bowel syndrome)   . WBC decreased    Past Surgical History:  Procedure Laterality Date  . BLADDER REPAIR W/ CESAREAN SECTION  1987  . [place]  . VESICOVAGINAL FISTULA CLOSURE W/ TAH  2003   for fibroids and endometriosis   Social History   Social History  . Marital status: Married    Spouse name: N/A  . Number of children: N/A  . Years of education: N/A   Social History Main Topics  . Smoking status: Never Smoker  . Smokeless tobacco: Never Used  . Alcohol use Yes  . Drug use: No  . Sexual activity: Not Asked   Other Topics Concern  . 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 Arthritis Mother   . Cancer Father     bladder  . Hyperlipidemia Father   . Migraines Sister   . Hyperlipidemia Sister     low wbc  . Dementia      older aunt uncle gm 59s and 46s  . Obstructive Sleep Apnea Mother     on CPAP    Past medical history, social, surgical and family history all reviewed in electronic medical record.  No pertanent information unless stated regarding to the chief complaint.   Review of Systems: No headache, visual changes, nausea, vomiting, diarrhea, constipation, dizziness, abdominal pain, skin rash, fevers, chills, night sweats, weight loss, swollen lymph nodes, body aches, joint swelling, muscle aches, chest pain, shortness of breath, mood changes.   Objective  Blood pressure 130/82, pulse (!) 58, height 5\' 4"  (1.626 m), weight 152 lb (68.9 kg), SpO2 98 %.  Systems examined below as of 11/27/15 General: NAD A&O x3 mood, affect normal  HEENT: Pupils equal, extraocular movements intact no nystagmus Respiratory: not short of breath at rest or with speaking Cardiovascular: No lower extremity edema, non tender Skin:  Warm dry intact with no signs of infection or rash on extremities or on axial skeleton. Abdomen: Soft nontender, no masses Neuro: Cranial nerves  intact, neurovascularly intact in all extremities with 2+ DTRs and 2+ pulses. Lymph: No lymphadenopathy appreciated today  Gait normal with good balance and coordination.  MSK: Non tender with full range of motion and good stability and symmetric strength and tone of shoulders, elbows, wrist,  hips and ankles bilaterally.   Knee: Left Mild lateral tilt of the patella compared to the contralateral knee Minimal tenderness over the medial joint line still remaining but moderately improved ROM full in flexion and extension and lower leg rotation. Ligaments with solid consistent endpoints including ACL, PCL, LCL, MCL. Positive McMurray's and  Thessalystill present painful patellar compression still present. Patellar glide with mild crepitus. Patellar and quadriceps tendons unremarkable. Hamstring and quadriceps strength is normal.  Contralateral knee also has some patellar grinding but otherwise unremarkable. Stable from previous exam       Impression and Recommendations:     This case required medical decision making of moderate complexity.      Note: This dictation was prepared with Dragon dictation along with smaller phrase technology. Any transcriptional errors that result from this process are unintentional.

## 2015-11-27 ENCOUNTER — Encounter: Payer: Self-pay | Admitting: Family Medicine

## 2015-11-27 ENCOUNTER — Ambulatory Visit (INDEPENDENT_AMBULATORY_CARE_PROVIDER_SITE_OTHER): Payer: 59 | Admitting: Family Medicine

## 2015-11-27 DIAGNOSIS — M222X2 Patellofemoral disorders, left knee: Secondary | ICD-10-CM | POA: Diagnosis not present

## 2015-11-27 DIAGNOSIS — M222X1 Patellofemoral disorders, right knee: Secondary | ICD-10-CM | POA: Diagnosis not present

## 2015-11-27 NOTE — Assessment & Plan Note (Signed)
Still believe that patient's most of her, discomfort seems to be from the patellofemoral syndrome. Patient does have meniscal tear but likely is healing. Still has some mild signs of symptoms. Is making some progress. Discussed the possibility of formal physical therapy the patient does travel significant amount. Patient work with Product/process development scientist to learn home exercises again and greater detail. Discussed with patient about potential bracing or advance imaging. Patient knows if any increasing instability we will need to consider further advanced imaging. Discussed with patient at great length. All questions answered.  Spent  25 minutes with patient face-to-face and had greater than 50% of counseling including as described above in assessment and plan.

## 2015-11-27 NOTE — Patient Instructions (Signed)
Good to see you  Ice 20 minutes 2 times daily. Usually after activity and before bed. Exercises 3 times a week.  See me again in 3 weeks and we should be making a difference.

## 2015-12-14 NOTE — Progress Notes (Signed)
[name] [address] [place], [place] [address] Phone: [phone] Subjective:     CC: Left knee pain f/u  [id number]  Stacey Young is a 60 y.o. female coming in with complaint of Left knee pain.  Found to have meniscal tear. 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 Patient was continuing to have instability of the knee. Patient was to do icing as well as the home exercises. Patient does travel a significant amount and was unable to do formal physical therapy regularly. To do the home exercises. Patient states She is made significant improvement. Has more of a discomfort than pain. No locking or giving out on her. Happy with the results. Doing the exercises much more regularly.     Past Medical History:  Diagnosis Date  . Anemia   . Back pain   . Fibroids    uterine  . Hay fever   . Hx of colonoscopy    utd  . Hx of transfusion    w gall baldder surgery  donates blood ( thus eg screen)  . Hx of varicella    as a child  . IBS (irritable bowel syndrome)   . WBC decreased    Past Surgical History:  Procedure Laterality Date  . BLADDER REPAIR W/ CESAREAN SECTION  1987  . [place]  . VESICOVAGINAL FISTULA CLOSURE W/ TAH  2003   for fibroids and endometriosis   Social History   Social History  . Marital status: Married    Spouse name: N/A  . Number of children: N/A  . Years of education: N/A   Social History Main Topics  . Smoking status: Never Smoker  . Smokeless tobacco: Never Used  . Alcohol use Yes  . Drug use: No  . Sexual activity: Not Asked   Other Topics Concern  . 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 Arthritis Mother   . Cancer Father     bladder  . Hyperlipidemia Father   . Migraines Sister   . Hyperlipidemia Sister     low wbc  . Dementia      older aunt uncle gm 52s and 40s  . Obstructive Sleep Apnea Mother     on CPAP    Past medical history, social, surgical and family history all reviewed in electronic medical record.  No pertanent information unless stated regarding to the chief complaint.   Review of Systems: No headache, visual changes, nausea, vomiting, diarrhea, constipation, dizziness, abdominal pain, skin rash, fevers, chills, night sweats, weight loss, swollen lymph nodes, body aches, joint swelling, muscle aches, chest pain, shortness of breath, mood changes.   Objective  Pulse 61, height 5\' 4"  (1.626 m), weight 152 lb (68.9 kg), SpO2 98 %.  Systems examined below as of 12/15/15 General: NAD A&O x3 mood, affect normal  HEENT: Pupils equal, extraocular movements intact no nystagmus Respiratory: not short of breath at rest or with speaking Cardiovascular: No lower extremity edema, non tender Skin: Warm dry  intact with no signs of infection or rash on extremities or on axial skeleton. Abdomen: Soft nontender, no masses Neuro: Cranial nerves  intact, neurovascularly intact in all extremities with 2+ DTRs and 2+ pulses. Lymph: No lymphadenopathy appreciated today  Gait normal with good balance and coordination.  MSK: Non tender with full range of motion and good stability and symmetric strength and tone of shoulders, elbows, wrist,  hips and ankles bilaterally.   Knee: Left Continue mild lateral tilt of the patella Nontender over the medial joint line ROM full in flexion and extension and lower leg rotation. Ligaments with solid consistent endpoints including ACL, PCL, LCL, MCL. Negative McMurray's and Thessalystill present painful patellar compression still present. Patellar glide with mild  crepitus Patellar and quadriceps tendons unremarkable. Hamstring and quadriceps strength is normal.  Contralateral knee also has some patellar grinding but otherwise unremarkable. Stable from previous exam.       Impression and Recommendations:     This case required medical decision making of moderate complexity.      Note: This dictation was prepared with Dragon dictation along with smaller phrase technology. Any transcriptional errors that result from this process are unintentional.

## 2015-12-15 ENCOUNTER — Ambulatory Visit (INDEPENDENT_AMBULATORY_CARE_PROVIDER_SITE_OTHER): Payer: 59 | Admitting: Family Medicine

## 2015-12-15 ENCOUNTER — Encounter: Payer: Self-pay | Admitting: Family Medicine

## 2015-12-15 DIAGNOSIS — S83242D Other tear of medial meniscus, current injury, left knee, subsequent encounter: Secondary | ICD-10-CM

## 2015-12-15 NOTE — Assessment & Plan Note (Signed)
Patient doing well overall. Continue conservative therapy. We discussed the importance of vastus medialis strengthening as well as hip abductor strengthening. Has meloxicam for breakthrough pain. Follow-up again in 3-6 weeks if not better.

## 2016-01-11 NOTE — Progress Notes (Signed)
[name] [place] [place], [place] [address] Phone: [phone] Subjective:     CC: Left knee pain f/u  [id number]  Stacey Young is a 60 y.o. female coming in with complaint of Left knee pain.  Found to have meniscal tear. 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 Patient was continuing to have instability of the knee. Was doing great and now a little worse again. More aware. Still states that she is 80% better than the first initial appointment. Patient states that overall seems to be doing relatively well and has had some mild discomfort from time to time but has had one episode seemed to get out of. More tightness in the back than usual.     Past Medical History:  Diagnosis Date  . Anemia   . Back pain   . Fibroids    uterine  . Hay fever   . Hx of colonoscopy    utd  . Hx of transfusion    w gall baldder surgery  donates blood ( thus eg screen)  . Hx of varicella    as a child  . IBS (irritable bowel syndrome)   . WBC decreased    Past Surgical History:  Procedure Laterality Date  . BLADDER REPAIR W/ CESAREAN SECTION  1987  . [place]  . VESICOVAGINAL FISTULA CLOSURE W/ TAH  2003   for fibroids and endometriosis   Social History   Social History  . Marital status: Married    Spouse name: N/A  . Number of children: N/A  . Years of education: N/A   Social History Main Topics  . Smoking status: Never Smoker  . Smokeless tobacco: Never Used  . Alcohol use Yes  . Drug use: No  . Sexual activity: Not on file   Other Topics Concern  . 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 Arthritis Mother   . Cancer Father     bladder  . Hyperlipidemia Father   . Migraines Sister   . Hyperlipidemia Sister     low wbc  . Dementia      older aunt uncle gm 64s and 77s  . Obstructive Sleep Apnea Mother     on CPAP    Past medical history, social, surgical and family history all reviewed in electronic medical record.  No pertanent information unless stated regarding to the chief complaint.   Review of Systems: No headache, visual changes, nausea, vomiting, diarrhea, constipation, dizziness, abdominal pain, skin rash, fevers, chills, night sweats, weight loss, swollen lymph nodes, muscle aches, chest pain, shortness of breath, mood changes.    Objective  There were no vitals taken for this visit.  Systems examined below as of 01/12/16 General: NAD A&O x3 mood, affect normal  HEENT: Pupils equal, extraocular movements intact no nystagmus Respiratory: not short of breath at rest or with speaking Cardiovascular: No lower extremity edema, non tender Skin: Warm dry intact with no signs of infection or rash  on extremities or on axial skeleton. Abdomen: Soft nontender, no masses Neuro: Cranial nerves  intact, neurovascularly intact in all extremities with 2+ DTRs and 2+ pulses. Lymph: No lymphadenopathy appreciated today  Gait normal with good balance and coordination.  MSK: Non tender with full range of motion and good stability and symmetric strength and tone of shoulders, elbows, wrist, hips and ankles bilaterally.    Knee: Left Continue mild lateral tilt of the patella Nontender over the medial joint line ROM full in flexion and extension and lower leg rotation. Ligaments with solid consistent endpoints including ACL, PCL, LCL, MCL. Negative McMurray's and Thessalystill present Joint tenderness with patellar compression. This is an improvement Patellar glide with mild crepitus Patellar and quadriceps tendons unremarkable. Hamstring  and quadriceps strength is normal.  Contralateral knee also has some patellar grinding but otherwise unremarkable. Seems to be somewhat better than previous exam.       Impression and Recommendations:     This case required medical decision making of moderate complexity.      Note: This dictation was prepared with Dragon dictation along with smaller phrase technology. Any transcriptional errors that result from this process are unintentional.

## 2016-01-12 ENCOUNTER — Encounter: Payer: Self-pay | Admitting: Family Medicine

## 2016-01-12 ENCOUNTER — Ambulatory Visit (INDEPENDENT_AMBULATORY_CARE_PROVIDER_SITE_OTHER): Payer: 59 | Admitting: Family Medicine

## 2016-01-12 DIAGNOSIS — S83242D Other tear of medial meniscus, current injury, left knee, subsequent encounter: Secondary | ICD-10-CM | POA: Diagnosis not present

## 2016-01-12 MED ORDER — MELOXICAM 15 MG PO TABS: 15.0000 mg | ORAL_TABLET | Freq: Every day | ORAL | 0 refills | Status: DC

## 2016-01-12 NOTE — Patient Instructions (Signed)
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 Keep trucking along Exercises at least 2 times a week  Call me if things et worse  I hope everything goes well with the move See me again in 6 weeks

## 2016-01-12 NOTE — Assessment & Plan Note (Signed)
Patient is improving but not completely. We discussed with patient about potential injection of formal physical therapy which patient declined. Patient given up her scheduled for anti-inflammatory. Patient will continue with conservative therapy. Follow-up again in 6-8 weeks. Patient knows if any locking or giving out on her on a regular basis to come back sooner.

## 2016-01-17 ENCOUNTER — Other Ambulatory Visit: Payer: Self-pay | Admitting: Internal Medicine

## 2016-01-18 ENCOUNTER — Telehealth: Payer: Self-pay | Admitting: Family Medicine

## 2016-01-18 ENCOUNTER — Other Ambulatory Visit: Payer: Self-pay | Admitting: Family Medicine

## 2016-01-18 DIAGNOSIS — D72819 Decreased white blood cell count, unspecified: Secondary | ICD-10-CM

## 2016-01-18 NOTE — Telephone Encounter (Signed)
Pt is due in Jan 2018 for lab work to recheck low WBC count.  Order placed in [facility].  Please help to make appointment. Thanks!!

## 2016-01-18 NOTE — Telephone Encounter (Signed)
Sent to the pharmacy by e-scribe.  Pt now due for lab work.  Order placed in [facility].  Message sent to scheduling.

## 2016-01-18 NOTE — Telephone Encounter (Signed)
Pt already sch

## 2016-01-23 ENCOUNTER — Other Ambulatory Visit: Payer: 59

## 2016-01-26 ENCOUNTER — Other Ambulatory Visit: Payer: 59

## 2016-01-30 ENCOUNTER — Other Ambulatory Visit: Payer: 59

## 2016-02-16 ENCOUNTER — Other Ambulatory Visit (INDEPENDENT_AMBULATORY_CARE_PROVIDER_SITE_OTHER): Payer: 59

## 2016-02-16 DIAGNOSIS — D72819 Decreased white blood cell count, unspecified: Secondary | ICD-10-CM

## 2016-02-16 DIAGNOSIS — Z0000 Encounter for general adult medical examination without abnormal findings: Secondary | ICD-10-CM

## 2016-02-16 LAB — CBC WITH DIFFERENTIAL/PLATELET
Basophils Absolute: 0 10*3/uL (ref 0.0–0.1)
Basophils Relative: 0.4 % (ref 0.0–3.0)
EOS ABS: 0 10*3/uL (ref 0.0–0.7)
Eosinophils Relative: 1.5 % (ref 0.0–5.0)
HEMATOCRIT: 38.3 % (ref 36.0–46.0)
HEMOGLOBIN: 13 g/dL (ref 12.0–15.0)
LYMPHS PCT: 52.1 % — AB (ref 12.0–46.0)
Lymphs Abs: 1.6 10*3/uL (ref 0.7–4.0)
MCHC: 34 g/dL (ref 30.0–36.0)
MCV: 89.9 fl (ref 78.0–100.0)
MONOS PCT: 13 % — AB (ref 3.0–12.0)
Monocytes Absolute: 0.4 10*3/uL (ref 0.1–1.0)
Neutro Abs: 1 10*3/uL — ABNORMAL LOW (ref 1.4–7.7)
Neutrophils Relative %: 33 % — ABNORMAL LOW (ref 43.0–77.0)
Platelets: 219 10*3/uL (ref 150.0–400.0)
RBC: 4.27 Mil/uL (ref 3.87–5.11)
RDW: 12.4 % (ref 11.5–15.5)
WBC: 3 10*3/uL — AB (ref 4.0–10.5)

## 2016-02-17 NOTE — Progress Notes (Signed)
[name] [address], [place] [address] Phone: [phone] Subjective:     CC: Left knee pain f/u  [id number]  Stacey Young is a 61 y.o. female coming in with complaint of Left knee pain.  Found to have meniscal tear. 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 Patient was continuing to have instability of the knee. Patient seems to be making some progress with conservative therapy. Patient was last seen greater than 6 weeks. Patient states No improvement and if anything worsening pain. Has had some instability noted. Patient states that there is also been some swelling. Seems worse than what has been. Wondering what else can be done.     Past Medical History:  Diagnosis Date  . Anemia   . Back pain   . Fibroids    uterine  . Hay fever   . Hx of colonoscopy    utd  . Hx of transfusion    w gall baldder surgery  donates blood ( thus eg screen)  . Hx of varicella    as a child  . IBS (irritable bowel syndrome)   . WBC decreased    Past Surgical History:  Procedure Laterality Date  . BLADDER REPAIR W/ CESAREAN SECTION  1987  . [place]  . VESICOVAGINAL FISTULA CLOSURE W/ TAH  2003   for fibroids and endometriosis   Social History   Social History  . Marital status: Married    Spouse name: N/A  . Number of children: N/A  . Years of education: N/A   Social History Main Topics  . Smoking status: Never Smoker  . Smokeless tobacco: Never Used  . Alcohol use Yes  . Drug use: No  . Sexual activity: Not on file   Other Topics Concern  . 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 Arthritis Mother   . Cancer Father     bladder  . Hyperlipidemia Father   . Migraines Sister   . Hyperlipidemia Sister     low wbc  . Dementia      older aunt uncle gm 64s and 46s  . Obstructive Sleep Apnea Mother     on CPAP    Past medical history, social, surgical and family history all reviewed in electronic medical record.  No pertanent information unless stated regarding to the chief complaint.   Review of Systems: No headache, visual changes, nausea, vomiting, diarrhea, constipation, dizziness, abdominal pain, skin rash, fevers, chills, night sweats, weight loss, swollen lymph nodes, body aches, joint swelling, muscle aches, chest pain, shortness of breath, mood changes.     Objective  There were no vitals taken for this visit.  Systems examined below as of 02/19/16 General: NAD A&O x3 mood, affect normal  HEENT: Pupils equal, extraocular movements intact no nystagmus Respiratory: not short of breath at rest or with speaking Cardiovascular: No lower extremity edema, non tender Skin: Warm dry intact with no signs of infection or rash on extremities  or on axial skeleton. Abdomen: Soft nontender, no masses Neuro: Cranial nerves  intact, neurovascularly intact in all extremities with 2+ DTRs and 2+ pulses. Lymph: No lymphadenopathy appreciated today  Gait normal with good balance and coordination.  MSK: Non tender with full range of motion and good stability and symmetric strength and tone of shoulders, elbows, wrist, hips and ankles bilaterally.  y.    Knee: Left Normal to inspection with no erythema or effusion or obvious bony abnormalities. Mild lateral tilt of the patella Painful lateral and superior patellofemoral joint. Mild medial joint pain. ROM full in flexion and extension and lower leg rotation. Ligaments with solid consistent endpoints including ACL, PCL, LCL, MCL. Positive Mcmurray's, Apley's, and Thessalonian tests. Mild  painful patellar compression. Patellar glide with mild crepitus. Patellar and quadriceps tendons unremarkable. Hamstring and quadriceps strength is normal.  Contralateral shoulder unremarkable  After informed written and verbal consent, patient was seated on exam table. Left knee was prepped with alcohol swab and utilizing anterolateral approach, patient's left knee space was injected with 4:1  marcaine 0.5%: Kenalog 40mg /dL. Patient tolerated the procedure well without immediate complications.     Impression and Recommendations:     This case required medical decision making of moderate complexity.      Note: This dictation was prepared with Dragon dictation along with smaller phrase technology. Any transcriptional errors that result from this process are unintentional.

## 2016-02-19 ENCOUNTER — Encounter: Payer: Self-pay | Admitting: Family Medicine

## 2016-02-19 ENCOUNTER — Ambulatory Visit (INDEPENDENT_AMBULATORY_CARE_PROVIDER_SITE_OTHER): Payer: 59 | Admitting: Family Medicine

## 2016-02-19 VITALS — BP 122/80 | HR 58 | Ht 64.0 in | Wt 157.0 lb

## 2016-02-19 DIAGNOSIS — M222X2 Patellofemoral disorders, left knee: Secondary | ICD-10-CM

## 2016-02-19 DIAGNOSIS — S83242D Other tear of medial meniscus, current injury, left knee, subsequent encounter: Secondary | ICD-10-CM

## 2016-02-19 DIAGNOSIS — M222X1 Patellofemoral disorders, right knee: Secondary | ICD-10-CM

## 2016-02-19 NOTE — Assessment & Plan Note (Signed)
Patient given injection today. Tolerated the procedure well. Worsening symptoms are more instability MRI potentially needed to rule out the severity of the chondromalacia as well as the possibility for a buckle meniscus. Patient's is in the complete agreement. Patient will start with icing regimen, home exercises, and has failed all other conservative therapy. Could be a candidate for viscous supplementation depending on results.  Spent  25 minutes with patient face-to-face and had greater than 50% of counseling including as described above in assessment and plan. Worsening consider viscous supplementation or MRI

## 2016-02-19 NOTE — Patient Instructions (Signed)
God to see you  Ice is your friend especially in 6 hours.  We tried an injection today and I hope it helps.  Lets give it 2 weeks to see how you do.  Write me in 2 weeks and if not a lot better lets consider MRI of the knee.  Otherwise see me again in 6 weeks.

## 2016-02-26 ENCOUNTER — Other Ambulatory Visit: Payer: Self-pay | Admitting: Family Medicine

## 2016-02-26 DIAGNOSIS — D709 Neutropenia, unspecified: Secondary | ICD-10-CM

## 2016-03-04 ENCOUNTER — Telehealth: Payer: Self-pay | Admitting: Hematology

## 2016-03-04 ENCOUNTER — Encounter: Payer: Self-pay | Admitting: Hematology

## 2016-03-04 NOTE — Telephone Encounter (Signed)
Appt has been scheduled for the pt to see Dr. Irene Limbo on 3/29 at 11am. Pt was driving and was able to confirm appt. She did tell me to schedule and will call me back to let me know if she can keep the appt. Letter mailed.

## 2016-03-11 ENCOUNTER — Encounter: Payer: Self-pay | Admitting: Family Medicine

## 2016-03-26 ENCOUNTER — Ambulatory Visit (HOSPITAL_BASED_OUTPATIENT_CLINIC_OR_DEPARTMENT_OTHER): Payer: 59

## 2016-03-26 ENCOUNTER — Ambulatory Visit (HOSPITAL_BASED_OUTPATIENT_CLINIC_OR_DEPARTMENT_OTHER): Payer: 59 | Admitting: Oncology

## 2016-03-26 VITALS — BP 120/83 | HR 69 | Temp 98.2°F | Resp 16 | Wt 153.3 lb

## 2016-03-26 DIAGNOSIS — D72820 Lymphocytosis (symptomatic): Secondary | ICD-10-CM

## 2016-03-26 DIAGNOSIS — D72819 Decreased white blood cell count, unspecified: Secondary | ICD-10-CM

## 2016-03-26 DIAGNOSIS — D709 Neutropenia, unspecified: Secondary | ICD-10-CM

## 2016-03-26 DIAGNOSIS — I10 Essential (primary) hypertension: Secondary | ICD-10-CM

## 2016-03-26 DIAGNOSIS — E538 Deficiency of other specified B group vitamins: Secondary | ICD-10-CM

## 2016-03-26 LAB — CBC WITH DIFFERENTIAL/PLATELET
BASO%: 0.3 % (ref 0.0–2.0)
BASOS ABS: 0 10*3/uL (ref 0.0–0.1)
EOS ABS: 0 10*3/uL (ref 0.0–0.5)
EOS%: 1 % (ref 0.0–7.0)
HCT: 38.4 % (ref 34.8–46.6)
HEMOGLOBIN: 13.3 g/dL (ref 11.6–15.9)
LYMPH#: 2 10*3/uL (ref 0.9–3.3)
LYMPH%: 51.7 % — ABNORMAL HIGH (ref 14.0–49.7)
MCH: 30.7 pg (ref 25.1–34.0)
MCHC: 34.6 g/dL (ref 31.5–36.0)
MCV: 88.7 fL (ref 79.5–101.0)
MONO#: 0.5 10*3/uL (ref 0.1–0.9)
MONO%: 12.5 % (ref 0.0–14.0)
NEUT%: 34.5 % — ABNORMAL LOW (ref 38.4–76.8)
NEUTROS ABS: 1.4 10*3/uL — AB (ref 1.5–6.5)
NRBC: 0 % (ref 0–0)
PLATELETS: 218 10*3/uL (ref 145–400)
RBC: 4.33 10*6/uL (ref 3.70–5.45)
RDW: 12.8 % (ref 11.2–14.5)
WBC: 3.9 10*3/uL (ref 3.9–10.3)

## 2016-03-26 NOTE — Progress Notes (Signed)
[facility] Initial Visit:  Patient Care Team: Burnis Medin, MD as PCP - General  CHIEF COMPLAINTS/PURPOSE OF CONSULTATION:  For evaluation of neutropenia.   No history exists.    HISTORY OF PRESENTING ILLNESS: Stacey Young 61 y.o. female is here because of  evaluation of neutropenia.  Patient has no history of hypertension diagnosed approximately 2 years ago, history of a dry eye syndrome has been diagnosed with a moderate neutropenia at least for a year. The patient also has a family history of neutropenia that her mother as well as her sister was diagnosed with a neutropenia without any significant complications.  Patient has been referred for hematology evaluation for further evaluation of her neutropenia.  At this time patient has no complaints. Patient has been maintaining her weight. Patient denies sense of breath, cough, hemoptysis, fever or chills.  Patient also denying having any frequent urinary tract infections, upper respiratory tract infections or sinus infections.  Review of Systems  Constitutional: Negative for appetite change, chills, diaphoresis, fatigue, fever and unexpected weight change.  HENT:   Negative for hearing loss, lump/mass, mouth sores, nosebleeds, sore throat, tinnitus, trouble swallowing and voice change.   Eyes: Negative for eye problems and icterus.  Respiratory: Negative for chest tightness, cough, hemoptysis, shortness of breath and wheezing.   Cardiovascular: Negative for chest pain, leg swelling and palpitations.  Gastrointestinal: Negative for abdominal distention, abdominal pain, blood in stool, constipation, diarrhea, nausea, rectal pain and vomiting.  Genitourinary: Negative for bladder incontinence, difficulty urinating, dysuria, frequency, hematuria, nocturia and pelvic pain.   Musculoskeletal: Negative for arthralgias, back pain, flank pain, gait problem, myalgias and neck pain.  Skin: Negative for itching and  rash.  Neurological: Negative for dizziness, extremity weakness, gait problem, headaches, light-headedness, numbness, seizures and speech difficulty.  Hematological: Negative for adenopathy.  Psychiatric/Behavioral: Negative for confusion, decreased concentration, depression, sleep disturbance and suicidal ideas. The patient is not nervous/anxious.     MEDICAL HISTORY: Past Medical History:  Diagnosis Date  . Anemia   . Back pain   . Fibroids    uterine  . Hay fever   . Hx of colonoscopy    utd  . Hx of transfusion    w gall baldder surgery  donates blood ( thus eg screen)  . Hx of varicella    as a child  . IBS (irritable bowel syndrome)   . WBC decreased     SURGICAL HISTORY: Past Surgical History:  Procedure Laterality Date  . BLADDER REPAIR W/ CESAREAN SECTION  1987  . [place]  . VESICOVAGINAL FISTULA CLOSURE W/ TAH  2003   for fibroids and endometriosis    SOCIAL HISTORY: Social History   Social History  . Marital status: Married    Spouse name: N/A  . Number of children: N/A  . Years of education: N/A   Occupational History  . Not on file.   Social History Main Topics  . Smoking status: Never Smoker  . Smokeless tobacco: Never Used  . Alcohol use 8.4 oz/week    14 Glasses of wine per week     Comment: "2 per night"   . Drug use: No  . Sexual activity: Not on file   Other Topics Concern  . 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 Arthritis Mother   . Obstructive Sleep Apnea Mother     on CPAP  . Cancer Father     bladder  . Hyperlipidemia Father   . Migraines Sister   .  Hyperlipidemia Sister     low wbc  . Dementia      older aunt uncle gm 34s and 40s    ALLERGIES:  has No Known Allergies.  MEDICATIONS:  Current Outpatient Prescriptions  Medication Sig Dispense Refill  . losartan-hydrochlorothiazide (HYZAAR) 100-12.5 MG tablet TAKE 1 TABLET DAILY 90 tablet 2  . RESTASIS 0.05 % ophthalmic emulsion Use both eyes twice daily     No current facility-administered medications for this visit.     PHYSICAL EXAMINATION:  ECOG PERFORMANCE STATUS: 0 - Asymptomatic      Vitals:   03/26/16 1316  BP: 120/83  Pulse: 69  Resp: 16  Temp: 98.2 F (36.8 C)    Filed Weights   03/26/16 1316  Weight: 153 lb 4.8 oz (69.5 kg)     Physical Exam  Constitutional: She is oriented to person, place, and time and well-developed, well-nourished, and in no distress.  HENT:  Head: Normocephalic and atraumatic.  Nose: Nose normal.  Mouth/Throat: Oropharynx is clear and moist.  Eyes: Conjunctivae and EOM are normal. Pupils are equal, round, and reactive to light. Right eye exhibits no discharge. Left eye exhibits no discharge. No scleral icterus.  Neck: Normal range of motion. Neck supple. No JVD present. No tracheal deviation present. No thyromegaly present.  Cardiovascular: Normal rate, regular rhythm, normal heart sounds and intact distal pulses.  Exam reveals no gallop and no friction rub.   No murmur heard. Pulmonary/Chest: Effort normal and breath sounds normal. No stridor. No respiratory distress. She has no wheezes. She has no rales. She exhibits no tenderness.  Abdominal: Soft. Bowel sounds are normal. She exhibits no distension and no mass. There is no tenderness. There is no rebound and no guarding.  Musculoskeletal: Normal range of motion. She exhibits no edema, tenderness or deformity.  Lymphadenopathy:    She has no cervical adenopathy.  Neurological: She is alert and oriented to person, place, and time. She displays normal reflexes. No cranial nerve  deficit. She exhibits normal muscle tone. Gait normal. Coordination normal.  Skin: Skin is warm and dry. No rash noted. No erythema. No pallor.  Psychiatric: Mood, memory, affect and judgment normal.     LABORATORY DATA: I have personally reviewed the data as listed:  Appointment on 03/26/2016  Component Date Value Ref Range Status  . WBC 03/26/2016 3.9  3.9 - 10.3 10e3/uL Final  . NEUT# 03/26/2016 1.4* 1.5 - 6.5 10e3/uL Final  . HGB 03/26/2016 13.3  11.6 - 15.9 g/dL Final  . HCT 03/26/2016 38.4  34.8 - 46.6 % Final  . Platelets 03/26/2016 218  145 - 400 10e3/uL Final  . MCV 03/26/2016 88.7  79.5 - 101.0 fL Final  . MCH 03/26/2016 30.7  25.1 - 34.0 pg Final  . MCHC 03/26/2016 34.6  31.5 - 36.0 g/dL Final  . RBC 03/26/2016 4.33  3.70 - 5.45 10e6/uL Final  . RDW 03/26/2016 12.8  11.2 - 14.5 % Final  . lymph# 03/26/2016 2.0  0.9 - 3.3 10e3/uL Final  . MONO# 03/26/2016 0.5  0.1 - 0.9 10e3/uL Final  . Eosinophils Absolute 03/26/2016 0.0  0.0 - 0.5 10e3/uL Final  . Basophils Absolute 03/26/2016 0.0  0.0 - 0.1 10e3/uL Final  . NEUT% 03/26/2016 34.5* 38.4 - 76.8 % Final  . LYMPH% 03/26/2016 51.7* 14.0 - 49.7 % Final  . MONO% 03/26/2016 12.5  0.0 - 14.0 % Final  . EOS% 03/26/2016 1.0  0.0 - 7.0 % Final  . BASO% 03/26/2016 0.3  0.0 - 2.0 % Final  . nRBC 03/26/2016 0  0 - 0 % Final    RADIOGRAPHIC STUDIES:   No results found.  ASSESSMENT/PLAN  1. Moderate neutropenia. 2. History of hypertension, under control. 3. Dry syndrome. 4. Family history of neutropenia. 5. Relative lymphocytosis.  Ms. Audrionna Tung a 61 year old Afro-American woman has been diagnosed with a moderate neutropenia and relative lymphocytosis.  At this time patient is totally asymptomatic. I have reviewed all the above test results with patient and also discuss further implications and the further options.  Patient has been having a moderate neutropenia at least for a year without any frequent upper  respiratory tract infections, urinary tract infections are sinus infections. Hence  at this time I do not believe patient has any significant hematological diagnosis. Her hemoglobin and platelets are also within normal range.  I have recommended further tests including evaluation of B-12 level and a ANA.   I have made a follow-up appointment in the outpatient clinic in 1 week to discuss the about test results.For blood to thank you again her primary care provider for giving me the opportunity to participate in her care.   Cancer Staging No matching staging information was found for the patient.   No problem-specific Assessment & Plan notes found for this encounter.   Orders Placed This Encounter  Procedures  . CBC with Differential    Standing Status:   Future    Number of Occurrences:   1    Standing Expiration Date:   03/26/2017  . Save smear  . ANA, IFA (with reflex)    Standing Status:   Future    Number of Occurrences:   1    Standing Expiration Date:   03/26/2017  . Vitamin B12    Standing Status:   Future    Number of Occurrences:   1    Standing Expiration Date:   03/26/2017    All questions were answered. The patient knows to call the clinic with any problems, questions or concerns.  This note was electronically signed.    Judge Stall, MD  03/26/2016 5:51 PM

## 2016-03-27 LAB — ANTINUCLEAR ANTIBODIES, IFA: ANA Titer 1: NEGATIVE

## 2016-03-27 LAB — VITAMIN B12: Vitamin B12: 255 pg/mL (ref 232–1245)

## 2016-03-29 ENCOUNTER — Telehealth: Payer: Self-pay | Admitting: Oncology

## 2016-03-29 NOTE — Telephone Encounter (Signed)
Needs to change appointment to Thursday of next week.  She need the earliest appointment available   [phone]

## 2016-04-01 ENCOUNTER — Telehealth: Payer: Self-pay | Admitting: Oncology

## 2016-04-01 NOTE — Telephone Encounter (Signed)
Pt called to r/s MD appt to 3/22 at 1030 am due to work schedule

## 2016-04-03 ENCOUNTER — Ambulatory Visit: Payer: 59

## 2016-04-04 ENCOUNTER — Ambulatory Visit (HOSPITAL_BASED_OUTPATIENT_CLINIC_OR_DEPARTMENT_OTHER): Payer: 59

## 2016-04-04 ENCOUNTER — Ambulatory Visit (HOSPITAL_BASED_OUTPATIENT_CLINIC_OR_DEPARTMENT_OTHER): Payer: 59 | Admitting: Oncology

## 2016-04-04 ENCOUNTER — Other Ambulatory Visit (HOSPITAL_COMMUNITY)
Admission: RE | Admit: 2016-04-04 | Discharge: 2016-04-04 | Disposition: A | Discharge status: Home / Self Care | Payer: 59 | Source: Ambulatory Visit | Attending: Oncology | Admitting: Oncology

## 2016-04-04 ENCOUNTER — Telehealth: Payer: Self-pay | Admitting: Oncology

## 2016-04-04 ENCOUNTER — Encounter: Payer: Self-pay | Admitting: Oncology

## 2016-04-04 VITALS — BP 155/79 | HR 60 | Temp 97.5°F | Resp 18 | Ht 64.0 in | Wt 154.4 lb

## 2016-04-04 DIAGNOSIS — Z862 Personal history of diseases of the blood and blood-forming organs and certain disorders involving the immune mechanism: Secondary | ICD-10-CM | POA: Diagnosis not present

## 2016-04-04 DIAGNOSIS — D72820 Lymphocytosis (symptomatic): Secondary | ICD-10-CM | POA: Diagnosis not present

## 2016-04-04 DIAGNOSIS — I10 Essential (primary) hypertension: Secondary | ICD-10-CM

## 2016-04-04 DIAGNOSIS — D709 Neutropenia, unspecified: Secondary | ICD-10-CM | POA: Diagnosis not present

## 2016-04-04 DIAGNOSIS — H04129 Dry eye syndrome of unspecified lacrimal gland: Secondary | ICD-10-CM

## 2016-04-04 DIAGNOSIS — C9110 Chronic lymphocytic leukemia of B-cell type not having achieved remission: Secondary | ICD-10-CM | POA: Diagnosis present

## 2016-04-04 NOTE — Telephone Encounter (Signed)
Gave patient AVS and calender per 3/22 los. Sent patient to lab after per 3/22 los.

## 2016-04-04 NOTE — Progress Notes (Signed)
[facility] Follow up:    Stacey Dawson, MD [place] [address] INTERVAL HISTORY:  Stacey Young returns for due follow-up visit.  HISTORY OF PRESENTING ILLNESS:   Patient has no history of hypertension diagnosed approximately 2 years ago, history of a dry eye syndrome has been diagnosed with a moderate neutropenia at least for a year. The patient also has a family history of neutropenia that her mother as well as her sister was diagnosed with a neutropenia without any significant complications.  Patient has been referred for hematology evaluation for further evaluation of her neutropenia.  At this time patient has no complaints. Patient has been maintaining her weight. Patient denies sense of breath, cough, hemoptysis, fever or chills.  Patient also denying having any frequent urinary tract infections, upper respiratory tract infections or sinus infections.  Repeat CBC has revealed WBC count at 3.9. However peripheral smear has revealed atypical lymphocytes with  relative lymphocytosis.  Patient Active Problem List   Diagnosis Date Noted  . Acute medial meniscal tear 09/11/2015  . Patellofemoral syndrome of both knees 09/11/2015  . Knee MCL sprain 09/11/2015  . Sore throat 09/02/2014  . Ear pain 09/02/2014  . Essential hypertension 11/19/2013  . Occasional numbness/prickling/tingling right mid toes 12/09/2012  . Family history of diabetes mellitus 12/09/2012  . Anterior knee pain 12/03/2010  . Sleep disturbances 12/03/2010  . Visit for preventive health examination 12/03/2010  . Foot pain, left 09/22/2010  . [place], MILD 11/13/2009  . BACK PAIN 07/24/2009  . HYPERGLYCEMIA 07/24/2009    has No Known Allergies.  MEDICAL HISTORY: Past Medical History:  Diagnosis Date  . Anemia   . Back pain   . Fibroids    uterine  . Hay fever   . Hx of colonoscopy    utd  . Hx of transfusion    w gall  baldder surgery  donates blood ( thus eg screen)  . Hx of varicella    as a child  . IBS (irritable bowel syndrome)   . WBC decreased     SURGICAL HISTORY: Past Surgical History:  Procedure Laterality Date  . BLADDER REPAIR W/ CESAREAN SECTION  1987  . [place]  . VESICOVAGINAL FISTULA CLOSURE W/ TAH  2003   for fibroids and endometriosis    SOCIAL HISTORY: Social History   Social History  . Marital status: Married    Spouse name: N/A  . Number of children: N/A  . Years of education: N/A   Occupational History  . Not on file.   Social History Main Topics  . Smoking status: Never Smoker  . Smokeless tobacco: Never Used  . Alcohol use 8.4 oz/week    14 Glasses of wine per week     Comment: "2 per night"   . Drug use: No  . Sexual activity: Not on file   Other Topics Concern  . 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 Arthritis Mother   .  Obstructive Sleep Apnea Mother     on CPAP  . Cancer Father     bladder  . Hyperlipidemia Father   . Migraines Sister   . Hyperlipidemia Sister     low wbc  . Dementia      older aunt uncle gm 86s and 80s    Review of Systems  Constitutional: Negative.   HENT:  Negative.   Eyes: Negative.   Respiratory: Negative.   Cardiovascular: Negative.   Gastrointestinal: Negative.   Endocrine: Negative.   Genitourinary: Negative.    Musculoskeletal: Negative.   Skin: Negative.   Neurological: Negative.   Hematological: Negative.   Psychiatric/Behavioral: Negative.       PHYSICAL EXAMINATION Brief physical examination was not performed today.   ECOG  PERFORMANCE STATUS: 0 - Asymptomatic  Vitals:   04/04/16 1048  BP: (!) 155/79  Pulse: 60  Resp: 18  Temp: 97.5 F (36.4 C)    Physical Exam  LABORATORY DATA:  CBC    Component Value Date/Time   WBC 3.9 03/26/2016 1434   WBC 3.0 (L) 02/16/2016 0851   RBC 4.33 03/26/2016 1434   RBC 4.27 02/16/2016 0851   HGB 13.3 03/26/2016 1434   HCT 38.4 03/26/2016 1434   PLT 218 03/26/2016 1434   MCV 88.7 03/26/2016 1434   MCH 30.7 03/26/2016 1434   MCHC 34.6 03/26/2016 1434   MCHC 34.0 02/16/2016 0851   RDW 12.8 03/26/2016 1434   LYMPHSABS 2.0 03/26/2016 1434   MONOABS 0.5 03/26/2016 1434   EOSABS 0.0 03/26/2016 1434   BASOSABS 0.0 03/26/2016 1434    CMP     Component Value Date/Time   NA 141 10/10/2015 0837   K 3.8 10/10/2015 0837   CL 105 10/10/2015 0837   CO2 30 10/10/2015 0837   GLUCOSE 95 10/10/2015 0837   BUN 18 10/10/2015 0837   CREATININE 0.74 10/10/2015 0837   CALCIUM 8.8 10/10/2015 0837   PROT 7.0 10/10/2015 0837   ALBUMIN 4.0 10/10/2015 0837   AST 18 10/10/2015 0837   ALT 19 10/10/2015 0837   ALKPHOS 62 10/10/2015 0837   BILITOT 0.5 10/10/2015 0837   GFRNONAA 135.89 11/06/2009 0856        ASSESSMENT and THERAPY PLAN:    1. Moderate neutropenia. 2. History of hypertension, under control. 3. Dry syndrome. 4. Family history of neutropenia. 5. Relative lymphocytosis.  Stacey Young a 61 year old Afro-American woman has been diagnosed with a moderate neutropenia and relative lymphocytosis.  At this time patient is totally asymptomatic. I have reviewed all the above test results with patient and also discuss further implications and the further options.  Her repeat CBC has revealed almost normal level of WBC count. Patient is currently asymptomatic. However peripheral smear examination has revealed a 2 lymphocytosis. Hence have recommended peripheral blood flow cytometry, which has been sent today.  I did not recommend any further interventions  at this time. However patient was advised to call our office if she develops any intercurrent infections such as UTI or  upper respiratory tract infections. I have made a follow-up appointment in the outpatient clinic in 4 weeks with repeat CBC.   Orders Placed This Encounter  Procedures  . Flow Cytometry    Leukemia / lymphoma panel    Standing Status:   Future    Number of Occurrences:   1    Standing Expiration Date:   04/04/2017  . CBC with Differential    Standing Status:   Future  Standing Expiration Date:   04/04/2017    All questions were answered. The patient knows to call the clinic with any problems, questions or concerns. We can certainly see the patient much sooner if necessary. This note was electronically signed. Judge Stall, MD 04/04/2016

## 2016-04-08 LAB — FLOW CYTOMETRY

## 2016-04-11 ENCOUNTER — Encounter: Payer: 59 | Admitting: Hematology

## 2016-04-25 ENCOUNTER — Telehealth: Payer: Self-pay | Admitting: Oncology

## 2016-04-25 NOTE — Telephone Encounter (Signed)
Pt called to r/s 4/18 appt to 4/13 at 330 pm due to being out of town next week

## 2016-04-26 ENCOUNTER — Other Ambulatory Visit (HOSPITAL_BASED_OUTPATIENT_CLINIC_OR_DEPARTMENT_OTHER): Payer: 59

## 2016-04-26 ENCOUNTER — Ambulatory Visit (HOSPITAL_BASED_OUTPATIENT_CLINIC_OR_DEPARTMENT_OTHER): Payer: 59 | Admitting: Oncology

## 2016-04-26 ENCOUNTER — Telehealth: Payer: Self-pay | Admitting: Oncology

## 2016-04-26 VITALS — BP 146/82 | HR 64 | Temp 98.0°F | Resp 16

## 2016-04-26 DIAGNOSIS — D72819 Decreased white blood cell count, unspecified: Secondary | ICD-10-CM

## 2016-04-26 DIAGNOSIS — D72820 Lymphocytosis (symptomatic): Secondary | ICD-10-CM

## 2016-04-26 DIAGNOSIS — D709 Neutropenia, unspecified: Secondary | ICD-10-CM

## 2016-04-26 DIAGNOSIS — C9110 Chronic lymphocytic leukemia of B-cell type not having achieved remission: Secondary | ICD-10-CM

## 2016-04-26 DIAGNOSIS — D72829 Elevated white blood cell count, unspecified: Secondary | ICD-10-CM | POA: Diagnosis not present

## 2016-04-26 LAB — CBC WITH DIFFERENTIAL/PLATELET
BASO%: 0.4 % (ref 0.0–2.0)
Basophils Absolute: 0 10*3/uL (ref 0.0–0.1)
EOS%: 1.1 % (ref 0.0–7.0)
Eosinophils Absolute: 0 10*3/uL (ref 0.0–0.5)
HCT: 38.1 % (ref 34.8–46.6)
HGB: 12.9 g/dL (ref 11.6–15.9)
LYMPH%: 51.4 % — ABNORMAL HIGH (ref 14.0–49.7)
MCH: 30.4 pg (ref 25.1–34.0)
MCHC: 33.9 g/dL (ref 31.5–36.0)
MCV: 89.6 fL (ref 79.5–101.0)
MONO#: 0.4 10*3/uL (ref 0.1–0.9)
MONO%: 10.8 % (ref 0.0–14.0)
NEUT#: 1.4 10*3/uL — ABNORMAL LOW (ref 1.5–6.5)
NEUT%: 36.3 % — ABNORMAL LOW (ref 38.4–76.8)
Platelets: 220 10*3/uL (ref 145–400)
RBC: 4.25 10*6/uL (ref 3.70–5.45)
RDW: 12.9 % (ref 11.2–14.5)
WBC: 3.9 10*3/uL (ref 3.9–10.3)
lymph#: 2 10*3/uL (ref 0.9–3.3)

## 2016-04-26 NOTE — Telephone Encounter (Signed)
Appointments scheduled per 4.13.18 LOS. Patient given AVS report and calendars with future scheduled appointments. °

## 2016-04-26 NOTE — Progress Notes (Signed)
[facility] Follow up:    Stacey Dawson, MD [place] [address]   DIAGNOSIS:  1. Neutropenia. 2. Atypical lymphocytosis  INTERVAL HISTORY: Stacey Young 61 y.o. female returns for scheduled follow-up visit.  Patient has no new complaints at this time. She feels her energy level is as usual. Patient denies frequent urinary tract infections or sinus infections. Patient denies fever chills nausea or vomiting. Patient denies abdominal pain, bright red blood per rectum or melena or hematochezia.  I did not perform complete physical examination. However I have reviewed her recent laboratory data. Her peripheral blood flow cytometry was unremarkable.  Repeat CBC performed today showed WBC count of 3.9 with normal hemoglobin and  platelets. Hours she still has mild lymphocytosis.  Patient Active Problem List   Diagnosis Date Noted  . Acute medial meniscal tear 09/11/2015  . Patellofemoral syndrome of both knees 09/11/2015  . Knee MCL sprain 09/11/2015  . Sore throat 09/02/2014  . Ear pain 09/02/2014  . Essential hypertension 11/19/2013  . Occasional numbness/prickling/tingling right mid toes 12/09/2012  . Family history of diabetes mellitus 12/09/2012  . Anterior knee pain 12/03/2010  . Sleep disturbances 12/03/2010  . Visit for preventive health examination 12/03/2010  . Foot pain, left 09/22/2010  . [place], MILD 11/13/2009  . BACK PAIN 07/24/2009  . HYPERGLYCEMIA 07/24/2009    has No Known Allergies.  MEDICAL HISTORY: Past Medical History:  Diagnosis Date  . Anemia   . Back pain   . Fibroids    uterine  . Hay fever   . Hx of colonoscopy    utd  . Hx of transfusion    w gall baldder surgery  donates blood ( thus eg screen)  . Hx of varicella    as a child  . IBS (irritable bowel syndrome)   . WBC decreased     SURGICAL HISTORY: Past Surgical History:  Procedure Laterality Date  . BLADDER REPAIR W/  CESAREAN SECTION  1987  . [place]  . VESICOVAGINAL FISTULA CLOSURE W/ TAH  2003   for fibroids and endometriosis    SOCIAL HISTORY: Social History   Social History  . Marital status: Married    Spouse name: N/A  . Number of children: N/A  . Years of education: N/A   Occupational History  . Not on file.   Social History Main Topics  . Smoking status: Never Smoker  . Smokeless tobacco: Never Used  . Alcohol use 8.4 oz/week    14 Glasses of wine per week     Comment: "2 per night"   . Drug use: No  . Sexual activity: Not on file   Other Topics Concern  . 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 Arthritis Mother   . Obstructive Sleep Apnea Mother     on CPAP  . Cancer Father     bladder  . Hyperlipidemia Father   . Migraines Sister   . Hyperlipidemia Sister  low wbc  . Dementia      older aunt uncle gm 2s and 42s    Review of Systems - Oncology    PHYSICAL EXAMINATION  ECOG PERFORMANCE STATUS: 0 - Asymptomatic  Vitals:   04/26/16 1518  BP: (!) 146/82  Pulse: 64  Resp: 16  Temp: 98 F (36.7 C)    Physical Exam  LABORATORY DATA:  CBC    Component Value Date/Time   WBC 3.9 04/26/2016 1507   WBC 3.0 (L) 02/16/2016 0851   RBC 4.25 04/26/2016 1507   RBC 4.27 02/16/2016 0851   HGB 12.9 04/26/2016 1507   HCT 38.1 04/26/2016 1507   PLT 220 04/26/2016 1507   MCV 89.6 04/26/2016 1507   MCH 30.4 04/26/2016 1507   MCHC 33.9 04/26/2016 1507   MCHC 34.0 02/16/2016 0851   RDW 12.9 04/26/2016 1507   LYMPHSABS 2.0 04/26/2016 1507   MONOABS 0.4 04/26/2016 1507   EOSABS 0.0 04/26/2016 1507    BASOSABS 0.0 04/26/2016 1507    CMP     Component Value Date/Time   NA 141 10/10/2015 0837   K 3.8 10/10/2015 0837   CL 105 10/10/2015 0837   CO2 30 10/10/2015 0837   GLUCOSE 95 10/10/2015 0837   BUN 18 10/10/2015 0837   CREATININE 0.74 10/10/2015 0837   CALCIUM 8.8 10/10/2015 0837   PROT 7.0 10/10/2015 0837   ALBUMIN 4.0 10/10/2015 0837   AST 18 10/10/2015 0837   ALT 19 10/10/2015 0837   ALKPHOS 62 10/10/2015 0837   BILITOT 0.5 10/10/2015 0837   GFRNONAA 135.89 11/06/2009 0856       PENDING LABS:   RADIOGRAPHIC STUDIES:  No results found.   PATHOLOGY:     ASSESSMENT and THERAPY PLAN:    1. Moderate neutropenia. 2. History of hypertension, under control. 3. Dry syndrome. 4. Family history of neutropenia. 5. Relative lymphocytosis.  Patient has  mild neutropenia with  relative lymphocytosis. However peripheral blood flow cytometry was unremarkable. At this time I did not recommend any further interventions.  I have made a follow-up appointment in the outpatient clinic in 6 months with repeat CBC. I have notified the patient that I will be leaving the practice next week. Hence I have made an appointment with other hematologist Dr. Mosie Lukes the outpatient clinic.    Orders Placed This Encounter  Procedures  . CBC with Differential    Standing Status:   Future    Standing Expiration Date:   04/26/2017  . Morphology    Standing Status:   Future    Standing Expiration Date:   04/26/2017    All questions were answered. The patient knows to call the clinic with any problems, questions or concerns. We can certainly see the patient much sooner if necessary. This note was electronically signed. Judge Stall, MD 04/26/2016

## 2016-05-01 ENCOUNTER — Other Ambulatory Visit: Payer: 59

## 2016-05-01 ENCOUNTER — Ambulatory Visit: Payer: 59

## 2016-06-29 ENCOUNTER — Other Ambulatory Visit: Payer: Self-pay | Admitting: Internal Medicine

## 2016-07-22 ENCOUNTER — Other Ambulatory Visit: Payer: Self-pay | Admitting: Internal Medicine

## 2016-07-22 DIAGNOSIS — Z1231 Encounter for screening mammogram for malignant neoplasm of breast: Secondary | ICD-10-CM

## 2016-07-23 NOTE — Progress Notes (Signed)
[facility] [place],  [address] Phone: [phone] Subjective:     CC: Left knee pain f/u  [id number]  Stacey Young is a 61 y.o. female coming in with complaint of Left knee pain.  Found to have meniscal tear. 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 Patient was last seen 5 months ago and was given an injection. Patient seemed to be doing very well. Patient states Injection only worked for approximately 2 months. Having worsening symptoms. Seemed that the locking is increasing in frequency. Has almost fell a couple times secondary to the knee. May be some increased swelling. Decreased range of motion. Affecting daily activities. Waking her up at night. Medications are not helping as much anymore.     Past Medical History:  Diagnosis Date  . Anemia   . Back pain   . Fibroids    uterine  . Hay fever   . Hx of colonoscopy    utd  . Hx of transfusion    w gall baldder surgery  donates blood ( thus eg screen)  . Hx of varicella    as a child  . IBS (irritable bowel syndrome)   . WBC decreased    Past Surgical History:  Procedure Laterality Date  . BLADDER REPAIR W/ CESAREAN SECTION  1987  . [place]  . VESICOVAGINAL FISTULA CLOSURE W/ TAH  2003   for fibroids and endometriosis   Social History   Social History  . Marital status: Married    Spouse name: N/A  . Number of children: N/A  . Years of education: N/A   Social History Main Topics  . Smoking status: Never Smoker  . Smokeless tobacco: Never Used  . Alcohol use 8.4 oz/week    14 Glasses of wine per week     Comment: "2 per night"   . Drug use: No  . Sexual activity: Not Asked   Other Topics Concern  . 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 Arthritis Mother   . Obstructive Sleep Apnea Mother        on CPAP  . Cancer Father        bladder  . Hyperlipidemia Father   . Migraines Sister   . Hyperlipidemia Sister        low wbc  . Dementia Unknown        older aunt uncle gm 10s and 31s    Past medical history, social, surgical and family history all reviewed in electronic medical record.  No pertanent information unless stated regarding to the chief complaint.   Review of Systems: No headache, visual changes, nausea, vomiting, diarrhea, constipation, dizziness, abdominal pain, skin rash, fevers, chills, night sweats, weight loss, swollen lymph nodes, body aches, joint swelling, muscle aches, chest pain, shortness of breath, mood changes.     Objective  Blood pressure 120/84, pulse (!) 54, height 5\' 4"  (1.626 m), weight 156 lb (70.8 kg), SpO2 98 %.  Systems examined below  as of 07/24/16 General: NAD A&O x3 mood, affect normal  HEENT: Pupils equal, extraocular movements intact no nystagmus Respiratory: not short of breath at rest or with speaking Cardiovascular: No lower extremity edema, non tender Skin: Warm dry intact with no signs of infection or rash on extremities or on axial skeleton. Abdomen: Soft nontender, no masses Neuro: Cranial nerves  intact, neurovascularly intact in all extremities with 2+ DTRs and 2+ pulses. Lymph: No lymphadenopathy appreciated today  Gait antalgic gait  MSK: Non tender with full range of motion and good stability and symmetric strength and tone of shoulders, elbows, wrist,  hips and ankles bilaterally.    Knee: Left Normal to inspection with no erythema or effusion or obvious bony abnormalities. Palpation normal with no warmth, joint line tenderness, patellar tenderness, or condyle tenderness. Lacks last 5 of  flexion. Ligaments with solid consistent endpoints including ACL, PCL, LCL, MCL. Positive Mcmurray's, Apley's, and Thessalonian tests. painful patellar compression. Patellar glide with mild crepitus. Patellar and quadriceps tendons unremarkable. Hamstring and quadriceps strength is normal.  Contralateral knee unremarkable.     Impression and Recommendations:     This case required medical decision making of moderate complexity.      Note: This dictation was prepared with Dragon dictation along with smaller phrase technology. Any transcriptional errors that result from this process are unintentional.

## 2016-07-24 ENCOUNTER — Ambulatory Visit (INDEPENDENT_AMBULATORY_CARE_PROVIDER_SITE_OTHER): Payer: 59 | Admitting: Family Medicine

## 2016-07-24 ENCOUNTER — Encounter: Payer: Self-pay | Admitting: Family Medicine

## 2016-07-24 DIAGNOSIS — S83242D Other tear of medial meniscus, current injury, left knee, subsequent encounter: Secondary | ICD-10-CM

## 2016-07-24 NOTE — Patient Instructions (Signed)
Good to see you  MRI will be ordered and they will call you  Ice is your friend.  OK to stay active just do not twist  Depending on the findings we will discuss.

## 2016-07-24 NOTE — Assessment & Plan Note (Signed)
Patient is having worsening symptoms at this time. Having locking. Has even almost fallen. Patient is having limited range of motion now as well. Patient will be sent for an MRI for further evaluation. Fell on the conservative therapy and would be a surgical candidate. Patient agreed with plan. Depending on findings we will discuss further medical management thereafter.

## 2016-08-02 ENCOUNTER — Encounter: Payer: Self-pay | Admitting: Family Medicine

## 2016-08-02 DIAGNOSIS — S83242A Other tear of medial meniscus, current injury, left knee, initial encounter: Secondary | ICD-10-CM

## 2016-08-06 ENCOUNTER — Ambulatory Visit
Admission: RE | Admit: 2016-08-06 | Discharge: 2016-08-06 | Disposition: A | Discharge status: Home / Self Care | Payer: 59 | Source: Ambulatory Visit | Attending: Internal Medicine | Admitting: Internal Medicine

## 2016-08-06 DIAGNOSIS — Z1231 Encounter for screening mammogram for malignant neoplasm of breast: Secondary | ICD-10-CM | POA: Diagnosis not present

## 2016-08-20 ENCOUNTER — Encounter: Payer: Self-pay | Admitting: Family Medicine

## 2016-08-20 ENCOUNTER — Ambulatory Visit
Admission: RE | Admit: 2016-08-20 | Discharge: 2016-08-20 | Disposition: A | Discharge status: Home / Self Care | Payer: 59 | Source: Ambulatory Visit | Attending: Family Medicine | Admitting: Family Medicine

## 2016-08-20 DIAGNOSIS — S83242A Other tear of medial meniscus, current injury, left knee, initial encounter: Secondary | ICD-10-CM

## 2016-08-20 DIAGNOSIS — M25562 Pain in left knee: Secondary | ICD-10-CM | POA: Diagnosis not present

## 2016-09-10 DIAGNOSIS — S83242A Other tear of medial meniscus, current injury, left knee, initial encounter: Secondary | ICD-10-CM | POA: Diagnosis not present

## 2016-09-10 DIAGNOSIS — M25562 Pain in left knee: Secondary | ICD-10-CM | POA: Diagnosis not present

## 2016-09-24 DIAGNOSIS — Z23 Encounter for immunization: Secondary | ICD-10-CM | POA: Diagnosis not present

## 2016-10-04 ENCOUNTER — Encounter: Payer: Self-pay | Admitting: Internal Medicine

## 2016-10-15 ENCOUNTER — Other Ambulatory Visit: Payer: 59

## 2016-10-16 DIAGNOSIS — M94262 Chondromalacia, left knee: Secondary | ICD-10-CM | POA: Diagnosis not present

## 2016-10-16 DIAGNOSIS — M2242 Chondromalacia patellae, left knee: Secondary | ICD-10-CM | POA: Diagnosis not present

## 2016-10-16 DIAGNOSIS — M6752 Plica syndrome, left knee: Secondary | ICD-10-CM | POA: Diagnosis not present

## 2016-10-16 DIAGNOSIS — G8918 Other acute postprocedural pain: Secondary | ICD-10-CM | POA: Diagnosis not present

## 2016-10-16 DIAGNOSIS — M23222 Derangement of posterior horn of medial meniscus due to old tear or injury, left knee: Secondary | ICD-10-CM | POA: Diagnosis not present

## 2016-10-22 ENCOUNTER — Encounter: Payer: 59 | Admitting: Internal Medicine

## 2016-10-22 DIAGNOSIS — S83242D Other tear of medial meniscus, current injury, left knee, subsequent encounter: Secondary | ICD-10-CM | POA: Diagnosis not present

## 2016-10-22 DIAGNOSIS — M25562 Pain in left knee: Secondary | ICD-10-CM | POA: Diagnosis not present

## 2016-10-22 DIAGNOSIS — M25662 Stiffness of left knee, not elsewhere classified: Secondary | ICD-10-CM | POA: Diagnosis not present

## 2016-10-28 NOTE — Progress Notes (Signed)
HEMATOLOGY CLINIC NOTE  Date of Service: 11/01/2016  Patient Care Team: Burnis Medin, MD as PCP - General  CHIEF COMPLAINTS/PURPOSE OF CONSULTATION:  Neutropenia Stacey Young   Interval History:   Stacey Young is a wonderful 61 y.o. female who was previously under the care of Dr. Thana Farr. I will proceed to follow Kirk for her neutropenia. She presents to the clinic today reporting that low WBCs is part of her family history. She does deny any family history of blood disorders. She notes a left knee surgery on October 3rd for her torn meniscus.  She does not have her full range yet, but she is exercising.   She denies fever, chills or unexpected weight loss and overall is feeling quite well.  Based on her lab workup, her B12 levels were noted to be low on last visit @ 255. She was recommended to start taking OTC B12 supplements. Her CBC is stable with no anemia no thrombocytopenia and mild chronic leukopenia/neutropenia since atleast 2013 with no issues with frequent/atypical or unusually severe infections. In-fact she denies any issues with infections at all. No other acute new symptoms.  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  MEDICAL HISTORY:  Past Medical History:  Diagnosis Date  . Anemia   . Back pain   . Fibroids    uterine  . Hay fever   . Hx of colonoscopy    utd  . Hx of transfusion    w gall baldder surgery  donates blood ( thus eg screen)  . Hx of varicella    as a child  . IBS (irritable bowel syndrome)   . WBC decreased     SURGICAL HISTORY: Past Surgical History:  Procedure Laterality Date  . BLADDER REPAIR W/ CESAREAN SECTION  1987  . [place]  . VESICOVAGINAL FISTULA CLOSURE W/ TAH  2003   for fibroids and endometriosis    SOCIAL HISTORY: Social  History   Social History  . Marital status: Married    Spouse name: N/A  . Number of children: N/A  . Years of education: N/A   Occupational History  . Not on file.   Social History Main Topics  . Smoking status: Never Smoker  . Smokeless tobacco: Never Used  . Alcohol use 8.4 oz/week    14 Glasses of wine per week     Comment: "2 per night"   . Drug use: No  . Sexual activity: Not on file   Other Topics Concern  . 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 Arthritis Mother   . Obstructive Sleep Apnea Mother        on CPAP  . Cancer Father        bladder  . Hyperlipidemia Father   . Migraines Sister   . Hyperlipidemia Sister        low  wbc  . Dementia Unknown        older aunt uncle gm 16s and 74s    ALLERGIES:  has No Known Allergies.  MEDICATIONS:  Current Outpatient Prescriptions  Medication Sig Dispense Refill  . losartan-hydrochlorothiazide (HYZAAR) 100-12.5 MG tablet take 1 tablet by mouth once daily 30 tablet 4   No current facility-administered medications for this visit.     REVIEW OF SYSTEMS:    10 Point review of Systems was done is negative except as noted above.  PHYSICAL EXAMINATION: ECOG PERFORMANCE STATUS: 0 - Asymptomatic  . Vitals:   11/01/16 0858  BP: 128/78  Pulse: 63  Resp: 17  Temp: 97.9 F (36.6 C)  SpO2: 100%   Filed Weights   11/01/16 0858  Weight: 156 lb 11.2 oz (71.1 kg)   .Body mass index is 26.9 kg/m.  GENERAL:alert, in no acute distress and comfortable SKIN: no acute rashes, no significant lesions EYES: conjunctiva are pink and non-injected, sclera  anicteric OROPHARYNX: MMM, no exudates, no oropharyngeal erythema or ulceration NECK: supple, no JVD LYMPH:  no palpable lymphadenopathy in the cervical, axillary or inguinal regions LUNGS: clear to auscultation b/l with normal respiratory effort HEART: regular rate & rhythm ABDOMEN:  normoactive bowel sounds , non tender, not distended. Extremity: no pedal edema, mild post surgical left knee swelling PSYCH: alert & oriented x 3 with fluent speech NEURO: no focal motor/sensory deficits  LABORATORY DATA:  I have reviewed the data as listed  . CBC Latest Ref Rng & Units 11/01/2016 04/26/2016 03/26/2016  WBC 3.9 - 10.3 10e3/uL 3.7(L) 3.9 3.9  Hemoglobin 11.6 - 15.9 g/dL 13.6 12.9 13.3  Hematocrit 34.8 - 46.6 % 40.1 38.1 38.4  Platelets 145 - 400 10e3/uL 240 220 218   ANC 1400 (1000-1400)  . CMP Latest Ref Rng & Units 10/10/2015 11/25/2014 08/02/2014  Glucose 70 - 99 mg/dL 95 100(H) 103(H)  BUN 6 - 23 mg/dL 18 19 13   Creatinine 0.40 - 1.20 mg/dL 0.74 0.83 0.74  Sodium 135 - 145 mEq/L 141 139 142  Potassium 3.5 - 5.1 mEq/L 3.8 3.6 4.5  Chloride 96 - 112 mEq/L 105 104 105  CO2 19 - 32 mEq/L 30 28 30   Calcium 8.4 - 10.5 mg/dL 8.8 9.3 9.4  Total Protein 6.0 - 8.3 g/dL 7.0 7.3 -  Total Bilirubin 0.2 - 1.2 mg/dL 0.5 0.6 -  Alkaline Phos 39 - 117 U/L 62 57 -  AST 0 - 37 U/L 18 18 -  ALT 0 - 35 U/L 19 19 -       RADIOGRAPHIC STUDIES: I have personally reviewed the radiological images as listed and agreed with the findings in the report. No results found.  ASSESSMENT & PLAN:  [name] is a 61 y.o. african-american female with  #1 Minimal Chronic Leucopenia/Neutropenia since atleast 2013. Probably longer. Family h/o "low WBC counts". No issues with infection. ANA neg. No overt medications causing this. flowcytometry done previously showed no abnormal Lymphocyte populations. 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  #2 B12 deficiency- Mild. 255  PLAN:  -Due to her lower levels of B12, I advised her to start supplemental sublingual B12 1000 micrograms daily to help her B12 levels and possibly her WBC counts. Rpt B12 levels in 4-6 months with PCP -I discussed all the labs findings available thus far and likely diagnosis of Benign ethnic neutropenia with her and answered all  her questions. -in the absence of concerning constitutional symptoms /worsening counts or new HSmegaly or LNadenopathy or freq/severe/atypical infections - would not recommend additional workup at this time -rpt cbc with diff with PCP in 6 months -we shall see her on an as needed basis.  F/u with Dr Regis Bill in 4-6 months with rpt labs CBC/diff/plts and B12 levels.  #3. History of hypertension, under control.  RTC with Dr. Irene Limbo as needed Start Vitamin B12 1000 micrograms daily CBC and B12 with her PCP, Dr. Regis Bill in 4-6 months    All of the patients questions were answered with apparent satisfaction. The patient knows to call the clinic with any problems, questions or concerns.  I spent 20 minutes counseling the patient face to face. The total time spent in the appointment was 25 minutes and more than 50% was on counseling and direct patient cares.    Sullivan Lone MD [place] AAHIVMS [facility] Hematology/Oncology Physician [facility] [place]  (Office):       [phone] (Work cell):  [phone] (Fax):           [phone]  11/01/2016 9:04 AM   This document serves as a record of services personally performed by Sullivan Lone, MD. It was created on her behalf by Joslyn Devon, a trained medical scribe. The creation of this record is based on the scribe's personal observations and the provider's statements to them. This document has been checked and approved by the attending provider.

## 2016-11-01 ENCOUNTER — Encounter: Payer: Self-pay | Admitting: Hematology

## 2016-11-01 ENCOUNTER — Other Ambulatory Visit (HOSPITAL_BASED_OUTPATIENT_CLINIC_OR_DEPARTMENT_OTHER): Payer: 59

## 2016-11-01 ENCOUNTER — Ambulatory Visit (HOSPITAL_BASED_OUTPATIENT_CLINIC_OR_DEPARTMENT_OTHER): Payer: 59 | Admitting: Hematology

## 2016-11-01 VITALS — BP 128/78 | HR 63 | Temp 97.9°F | Resp 17 | Ht 64.0 in | Wt 156.7 lb

## 2016-11-01 DIAGNOSIS — I10 Essential (primary) hypertension: Secondary | ICD-10-CM

## 2016-11-01 DIAGNOSIS — D709 Neutropenia, unspecified: Secondary | ICD-10-CM

## 2016-11-01 DIAGNOSIS — E538 Deficiency of other specified B group vitamins: Secondary | ICD-10-CM | POA: Diagnosis not present

## 2016-11-01 DIAGNOSIS — D72829 Elevated white blood cell count, unspecified: Secondary | ICD-10-CM

## 2016-11-01 LAB — CBC WITH DIFFERENTIAL/PLATELET
BASO%: 0.3 % (ref 0.0–2.0)
Basophils Absolute: 0 10*3/uL (ref 0.0–0.1)
EOS%: 1.3 % (ref 0.0–7.0)
Eosinophils Absolute: 0.1 10*3/uL (ref 0.0–0.5)
HCT: 40.1 % (ref 34.8–46.6)
HGB: 13.6 g/dL (ref 11.6–15.9)
LYMPH%: 48.1 % (ref 14.0–49.7)
MCH: 30.7 pg (ref 25.1–34.0)
MCHC: 33.9 g/dL (ref 31.5–36.0)
MCV: 90.5 fL (ref 79.5–101.0)
MONO#: 0.5 10*3/uL (ref 0.1–0.9)
MONO%: 13.2 % (ref 0.0–14.0)
NEUT#: 1.4 10*3/uL — ABNORMAL LOW (ref 1.5–6.5)
NEUT%: 37.1 % — ABNORMAL LOW (ref 38.4–76.8)
Platelets: 240 10*3/uL (ref 145–400)
RBC: 4.43 10*6/uL (ref 3.70–5.45)
RDW: 12.3 % (ref 11.2–14.5)
WBC: 3.7 10*3/uL — ABNORMAL LOW (ref 3.9–10.3)
lymph#: 1.8 10*3/uL (ref 0.9–3.3)

## 2016-11-01 LAB — MORPHOLOGY
PLT EST: ADEQUATE
RBC Comments: NORMAL

## 2016-11-03 ENCOUNTER — Encounter: Payer: Self-pay | Admitting: Internal Medicine

## 2016-11-04 NOTE — Telephone Encounter (Signed)
Not  Necessary to fast         optional since you dont have diabetes .

## 2016-11-04 NOTE — Telephone Encounter (Signed)
Dr Regis Bill, does this patient need fasting labs at follow up on 11/12/16?

## 2016-11-08 DIAGNOSIS — M25662 Stiffness of left knee, not elsewhere classified: Secondary | ICD-10-CM | POA: Diagnosis not present

## 2016-11-08 DIAGNOSIS — S83242D Other tear of medial meniscus, current injury, left knee, subsequent encounter: Secondary | ICD-10-CM | POA: Diagnosis not present

## 2016-11-08 DIAGNOSIS — M25562 Pain in left knee: Secondary | ICD-10-CM | POA: Diagnosis not present

## 2016-11-11 NOTE — Progress Notes (Signed)
Chief Complaint  Patient presents with  . Annual Exam    no new concerns    HPI: Patient  Stacey Young  61 y.o. comes in today for Preventive Health Care visit   HT   going ok Will need a refill of medication with new insurance.  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  Following.  Her B12 level was noted to be on the low side of normal and was told to take oral B12 and so she is taking in 1000/day.  Has no symptoms of B12 deficiency.  Knee was problematic has not gotten back to her basic exercise yet but it is in the works.  Health Maintenance  Topic Date Due  . Hepatitis C Screening  1955-12-01  . HIV Screening  06/07/1969  . MAMMOGRAM  08/07/2018  . TETANUS/TDAP  11/14/2019  . COLONOSCOPY  12/16/2019  . INFLUENZA VACCINE  Completed   Health Maintenance Review LIFESTYLE:  Exercise:    Walking not optimum yet.  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 GI /GU: No adominal pain, vomiting, change in bowel habits. No blood in the stool. No significant GU symptoms. SKIN/HEME: ,no acute skin rashes suspicious lesions or bleeding. No lymphadenopathy, nodules, masses.  NEURO/ PSYCH:  No neurologic signs such as weakness numbness. No depression anxiety. IMM/ Allergy: No unusual infections.  Allergy .   REST of 12 system review negative except as per HPI   Past Medical History:  Diagnosis Date  . Anemia   . Back pain   . Fibroids    uterine  . Hay fever   . Hx of colonoscopy    utd  . Hx of transfusion    w gall baldder surgery  donates blood ( thus eg screen)    . Hx of varicella    as a child  . IBS (irritable bowel syndrome)   . WBC decreased     Past Surgical History:  Procedure Laterality Date  . BLADDER REPAIR W/ CESAREAN SECTION  1987  . [place]  . VESICOVAGINAL FISTULA CLOSURE W/ TAH  2003   for fibroids and endometriosis    Family History  Problem Relation Age of Onset  . Arthritis Mother   . Obstructive Sleep Apnea Mother        on CPAP  . Cancer Father        bladder  . Hyperlipidemia Father   . Migraines Sister   . Hyperlipidemia Sister        low wbc  . Dementia Unknown        older aunt uncle gm 44s and 69s    Social History   Social History  . Marital status: Married    Spouse name: N/A  . Number of children: N/A  . Years of education: N/A   Social History Main Topics  . Smoking status: Never Smoker  . Smokeless tobacco: Never Used  . Alcohol use 8.4 oz/week    14 Glasses of wine per  week     Comment: "2 per night"   . Drug use: No  . Sexual activity: Not Asked   Other Topics Concern  . 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 losartan-hydrochlorothiazide (HYZAAR) 100-12.5 MG tablet take 1 tablet by mouth once daily 30 tablet 4   No facility-administered medications prior to visit.      EXAM:  BP 108/64 (BP Location: Right Arm, Patient Position: Sitting, Cuff Size: Normal)   Pulse 64   Temp 97.9 F (36.6 C) (Oral)   Ht 5' 3.5" (1.613 m)   Wt 158 lb 6.4 oz (71.8 kg)   BMI 27.62 kg/m   Body mass index is 27.62 kg/m. Wt Readings from Last 3 Encounters:  11/12/16 158 lb 6.4 oz (71.8 kg)  11/01/16 156 lb 11.2 oz (71.1 kg)   07/24/16 156 lb (70.8 kg)    Physical Exam: Vital signs reviewed XBD:ZHGD is a well-developed well-nourished alert cooperative    who appearsr stated age in no acute distress.  HEENT: normocephalic atraumatic , Eyes: PERRL EOM's full, conjunctiva clear, Nares: paten,t no deformity discharge or tenderness., Ears: no deformity EAC's clear TMs with normal landmarks. Mouth: clear OP, no lesions, edema.  Moist mucous membranes. Dentition in adequate repair. NECK: supple without masses, thyromegaly or bruits. CHEST/PULM:  Clear to auscultation and percussion breath sounds equal no wheeze , rales or rhonchi. No chest wall deformities or tenderness. Breast: normal by inspection . No dimpling, discharge, masses, tenderness or discharge . CV: PMI is nondisplaced, S1 S2 no gallops, murmurs, rubs. Peripheral pulses are full without delay.No JVD .  ABDOMEN: Bowel sounds normal nontender  No guard or rebound, no hepato splenomegal no CVA tenderness.  No hernia. Extremtities:  No clubbing cyanosis or edema, no acute joint swelling or redness no focal atrophy favors left knee  NEURO:  Oriented x3, cranial nerves 3-12 appear to be intact, no obvious focal weakness,gait within normal limits no abnormal reflexes or asymmetrical SKIN: No acute rashes normal turgor, color, no bruising or petechiae. PSYCH: Oriented, good eye contact, no obvious depression anxiety, cognition and judgment appear normal. LN: no cervical axillary inguinal adenopathy  Lab Results  Component Value Date   WBC 3.7 (L) 11/01/2016   HGB 13.6 11/01/2016   HCT 40.1 11/01/2016   PLT 240 11/01/2016   GLUCOSE 95 10/10/2015   CHOL 178 10/10/2015   TRIG 58.0 10/10/2015   HDL 66.80 10/10/2015   LDLCALC 100 (H) 10/10/2015   ALT 19 10/10/2015   AST 18 10/10/2015   NA 141 10/10/2015   K 3.8 10/10/2015   CL 105 10/10/2015   CREATININE 0.74 10/10/2015   BUN 18 10/10/2015   CO2 30 10/10/2015   TSH 1.57 10/10/2015   HGBA1C 5.7 12/09/2012     BP Readings from Last 3 Encounters:  11/12/16 108/64  11/01/16 128/78  07/24/16 120/84    Lab results reviewed with patient   ASSESSMENT AND PLAN:  Discussed the following assessment and plan:  Visit for preventive health examination - Plan: Basic  metabolic panel, Hepatic function panel, Lipid panel, TSH, Vitamin B12  Essential hypertension - controlled  - Plan: Basic metabolic panel, Hepatic function panel, Lipid panel, TSH, Vitamin B12  Medication management - Plan: Basic metabolic panel, Hepatic function panel, Lipid panel, TSH, Vitamin B12  Leukopenia, unspecified type - follow every 6 mos  for now poss benign ethnic leukopenia? - Plan: Basic metabolic panel, Hepatic function panel, Lipid panel, TSH, Vitamin B12  Low serum vitamin B12 - normal low  ok to take oral  for leukopenia as per heme  will follow level at labs  - Plan: Basic metabolic panel, Hepatic function panel, Lipid panel, TSH, Vitamin B12 Lab in a few weeks  And then fu depending on results  May get lab in 6 mos  ( No ov needed ) Patient Care Team: Burnis Medin, MD as PCP - General Patient Instructions   Make an appointment for fasting lab work or you can have water coffee in your medicines before lab tests  We will check your chemistry B12 cholesterol etc.  If all is well checkup in a year.     Preventive Care 40-64 Years, Female Preventive care refers to lifestyle choices and visits with your health care provider that can promote health and wellness. What does preventive care include?  A yearly physical exam. This is also called an annual well check.  Dental exams once or twice a year.  Routine eye exams. Ask your health care provider how often you should have your eyes checked.  Personal lifestyle choices, including: ? Daily care of your teeth and gums. ? Regular physical activity. ? Eating a healthy diet. ? Avoiding tobacco and drug use. ? Limiting alcohol use. ? Practicing safe  sex. ? Taking low-dose aspirin daily starting at age 22. ? Taking vitamin and mineral supplements as recommended by your health care provider. What happens during an annual well check? The services and screenings done by your health care provider during your annual well check will depend on your age, overall health, lifestyle risk factors, and family history of disease. Counseling Your health care provider may ask you questions about your:  Alcohol use.  Tobacco use.  Drug use.  Emotional well-being.  Home and relationship well-being.  Sexual activity.  Eating habits.  Work and work Statistician.  Method of birth control.  Menstrual cycle.  Pregnancy history.  Screening You may have the following tests or measurements:  Height, weight, and BMI.  Blood pressure.  Lipid and cholesterol levels. These may be checked every 5 years, or more frequently if you are over 80 years old.  Skin check.  Lung cancer screening. You may have this screening every year starting at age 109 if you have a 30-pack-year history of smoking and currently smoke or have quit within the past 15 years.  Fecal occult blood test (FOBT) of the stool. You may have this test every year starting at age 78.  Flexible sigmoidoscopy or colonoscopy. You may have a sigmoidoscopy every 5 years or a colonoscopy every 10 years starting at age 48.  Hepatitis C blood test.  Hepatitis B blood test.  Sexually transmitted disease (STD) testing.  Diabetes screening. This is done by checking your blood sugar (glucose) after you have not eaten for a while (fasting). You may have this done every 1-3 years.  Mammogram. This may be done every 1-2 years. Talk to your health care provider about when you should start having regular mammograms. This may depend on whether  you have a family history of breast cancer.  BRCA-related cancer screening. This may be done if you have a family history of breast, ovarian, tubal, or  peritoneal cancers.  Pelvic exam and Pap test. This may be done every 3 years starting at age 17. Starting at age 24, this may be done every 5 years if you have a Pap test in combination with an HPV test.  Bone density scan. This is done to screen for osteoporosis. You may have this scan if you are at high risk for osteoporosis.  Discuss your test results, treatment options, and if necessary, the need for more tests with your health care provider. Vaccines Your health care provider may recommend certain vaccines, such as:  Influenza vaccine. This is recommended every year.  Tetanus, diphtheria, and acellular pertussis (Tdap, Td) vaccine. You may need a Td booster every 10 years.  Varicella vaccine. You may need this if you have not been vaccinated.  Zoster vaccine. You may need this after age 13.  Measles, mumps, and rubella (MMR) vaccine. You may need at least one dose of MMR if you were born in 1957 or later. You may also need a second dose.  Pneumococcal 13-valent conjugate (PCV13) vaccine. You may need this if you have certain conditions and were not previously vaccinated.  Pneumococcal polysaccharide (PPSV23) vaccine. You may need one or two doses if you smoke cigarettes or if you have certain conditions.  Meningococcal vaccine. You may need this if you have certain conditions.  Hepatitis A vaccine. You may need this if you have certain conditions or if you travel or work in places where you may be exposed to hepatitis A.  Hepatitis B vaccine. You may need this if you have certain conditions or if you travel or work in places where you may be exposed to hepatitis B.  Haemophilus influenzae type b (Hib) vaccine. You may need this if you have certain conditions.  Talk to your health care provider about which screenings and vaccines you need and how often you need them. This information is not intended to replace advice given to you by your health care provider. Make sure you  discuss any questions you have with your health care provider. Document Released: 01/27/2015 Document Revised: 09/20/2015 Document Reviewed: 11/01/2014 Elsevier Interactive Patient Education  2017 [place]. Tavita Eastham M.D.

## 2016-11-12 ENCOUNTER — Ambulatory Visit (INDEPENDENT_AMBULATORY_CARE_PROVIDER_SITE_OTHER): Payer: 59 | Admitting: Internal Medicine

## 2016-11-12 ENCOUNTER — Encounter: Payer: Self-pay | Admitting: Internal Medicine

## 2016-11-12 VITALS — BP 108/64 | HR 64 | Temp 97.9°F | Ht 63.5 in | Wt 158.4 lb

## 2016-11-12 DIAGNOSIS — Z0000 Encounter for general adult medical examination without abnormal findings: Secondary | ICD-10-CM | POA: Diagnosis not present

## 2016-11-12 DIAGNOSIS — R7989 Other specified abnormal findings of blood chemistry: Secondary | ICD-10-CM | POA: Diagnosis not present

## 2016-11-12 DIAGNOSIS — Z79899 Other long term (current) drug therapy: Secondary | ICD-10-CM

## 2016-11-12 DIAGNOSIS — D72819 Decreased white blood cell count, unspecified: Secondary | ICD-10-CM | POA: Diagnosis not present

## 2016-11-12 DIAGNOSIS — I10 Essential (primary) hypertension: Secondary | ICD-10-CM

## 2016-11-12 DIAGNOSIS — E538 Deficiency of other specified B group vitamins: Secondary | ICD-10-CM

## 2016-11-12 MED ORDER — LOSARTAN POTASSIUM-HCTZ 100-12.5 MG PO TABS: 1.0000 | ORAL_TABLET | Freq: Every day | ORAL | 3 refills | Status: DC

## 2016-11-12 NOTE — Patient Instructions (Addendum)
Make an appointment for fasting lab work or you can have water coffee in your medicines before lab tests  We will check your chemistry B12 cholesterol etc.  If all is well checkup in a year.     Preventive Care 40-64 Years, Female Preventive care refers to lifestyle choices and visits with your health care provider that can promote health and wellness. What does preventive care include?  A yearly physical exam. This is also called an annual well check.  Dental exams once or twice a year.  Routine eye exams. Ask your health care provider how often you should have your eyes checked.  Personal lifestyle choices, including: ? Daily care of your teeth and gums. ? Regular physical activity. ? Eating a healthy diet. ? Avoiding tobacco and drug use. ? Limiting alcohol use. ? Practicing safe sex. ? Taking low-dose aspirin daily starting at age 13. ? Taking vitamin and mineral supplements as recommended by your health care provider. What happens during an annual well check? The services and screenings done by your health care provider during your annual well check will depend on your age, overall health, lifestyle risk factors, and family history of disease. Counseling Your health care provider may ask you questions about your:  Alcohol use.  Tobacco use.  Drug use.  Emotional well-being.  Home and relationship well-being.  Sexual activity.  Eating habits.  Work and work Statistician.  Method of birth control.  Menstrual cycle.  Pregnancy history.  Screening You may have the following tests or measurements:  Height, weight, and BMI.  Blood pressure.  Lipid and cholesterol levels. These may be checked every 5 years, or more frequently if you are over 59 years old.  Skin check.  Lung cancer screening. You may have this screening every year starting at age 26 if you have a 30-pack-year history of smoking and currently smoke or have quit within the past 15  years.  Fecal occult blood test (FOBT) of the stool. You may have this test every year starting at age 42.  Flexible sigmoidoscopy or colonoscopy. You may have a sigmoidoscopy every 5 years or a colonoscopy every 10 years starting at age 79.  Hepatitis C blood test.  Hepatitis B blood test.  Sexually transmitted disease (STD) testing.  Diabetes screening. This is done by checking your blood sugar (glucose) after you have not eaten for a while (fasting). You may have this done every 1-3 years.  Mammogram. This may be done every 1-2 years. Talk to your health care provider about when you should start having regular mammograms. This may depend on whether you have a family history of breast cancer.  BRCA-related cancer screening. This may be done if you have a family history of breast, ovarian, tubal, or peritoneal cancers.  Pelvic exam and Pap test. This may be done every 3 years starting at age 24. Starting at age 69, this may be done every 5 years if you have a Pap test in combination with an HPV test.  Bone density scan. This is done to screen for osteoporosis. You may have this scan if you are at high risk for osteoporosis.  Discuss your test results, treatment options, and if necessary, the need for more tests with your health care provider. Vaccines Your health care provider may recommend certain vaccines, such as:  Influenza vaccine. This is recommended every year.  Tetanus, diphtheria, and acellular pertussis (Tdap, Td) vaccine. You may need a Td booster every 10 years.  Varicella vaccine.  You may need this if you have not been vaccinated.  Zoster vaccine. You may need this after age 38.  Measles, mumps, and rubella (MMR) vaccine. You may need at least one dose of MMR if you were born in 1957 or later. You may also need a second dose.  Pneumococcal 13-valent conjugate (PCV13) vaccine. You may need this if you have certain conditions and were not previously  vaccinated.  Pneumococcal polysaccharide (PPSV23) vaccine. You may need one or two doses if you smoke cigarettes or if you have certain conditions.  Meningococcal vaccine. You may need this if you have certain conditions.  Hepatitis A vaccine. You may need this if you have certain conditions or if you travel or work in places where you may be exposed to hepatitis A.  Hepatitis B vaccine. You may need this if you have certain conditions or if you travel or work in places where you may be exposed to hepatitis B.  Haemophilus influenzae type b (Hib) vaccine. You may need this if you have certain conditions.  Talk to your health care provider about which screenings and vaccines you need and how often you need them. This information is not intended to replace advice given to you by your health care provider. Make sure you discuss any questions you have with your health care provider. Document Released: 01/27/2015 Document Revised: 09/20/2015 Document Reviewed: 11/01/2014 Elsevier Interactive Patient Education  2017 [employer].

## 2016-11-20 ENCOUNTER — Telehealth: Payer: Self-pay | Admitting: Internal Medicine

## 2016-11-20 MED ORDER — LOSARTAN POTASSIUM-HCTZ 100-12.5 MG PO TABS: 1.0000 | ORAL_TABLET | Freq: Every day | ORAL | 3 refills | Status: DC

## 2016-11-20 NOTE — Telephone Encounter (Signed)
Rx sent to [facility]

## 2016-11-20 NOTE — Telephone Encounter (Signed)
Pharm needs new rx losartan 100-12.5 mg #90 w.refill

## 2016-11-26 ENCOUNTER — Other Ambulatory Visit (INDEPENDENT_AMBULATORY_CARE_PROVIDER_SITE_OTHER): Payer: 59

## 2016-11-26 DIAGNOSIS — E538 Deficiency of other specified B group vitamins: Secondary | ICD-10-CM

## 2016-11-26 DIAGNOSIS — R7989 Other specified abnormal findings of blood chemistry: Secondary | ICD-10-CM | POA: Diagnosis not present

## 2016-11-26 DIAGNOSIS — I10 Essential (primary) hypertension: Secondary | ICD-10-CM

## 2016-11-26 DIAGNOSIS — D72819 Decreased white blood cell count, unspecified: Secondary | ICD-10-CM

## 2016-11-26 DIAGNOSIS — Z0000 Encounter for general adult medical examination without abnormal findings: Secondary | ICD-10-CM | POA: Diagnosis not present

## 2016-11-26 DIAGNOSIS — Z79899 Other long term (current) drug therapy: Secondary | ICD-10-CM

## 2016-11-26 LAB — BASIC METABOLIC PANEL
BUN: 14 mg/dL (ref 6–23)
CALCIUM: 9.3 mg/dL (ref 8.4–10.5)
CHLORIDE: 104 meq/L (ref 96–112)
CO2: 30 meq/L (ref 19–32)
CREATININE: 0.72 mg/dL (ref 0.40–1.20)
GFR: 105.39 mL/min (ref >60.00)
GLUCOSE: 115 mg/dL — AB (ref 70–99)
Potassium: 3.9 mEq/L (ref 3.5–5.1)
Sodium: 139 mEq/L (ref 135–145)

## 2016-11-26 LAB — LIPID PANEL
CHOL/HDL RATIO: 3
Cholesterol: 205 mg/dL — ABNORMAL HIGH (ref 0–200)
HDL: 72 mg/dL (ref >39.00)
LDL CALC: 118 mg/dL — AB (ref 0–99)
NonHDL: 133.23
TRIGLYCERIDES: 74 mg/dL (ref 0.0–149.0)
VLDL: 14.8 mg/dL (ref 0.0–40.0)

## 2016-11-26 LAB — HEPATIC FUNCTION PANEL
ALT: 21 U/L (ref 0–35)
AST: 19 U/L (ref 0–37)
Albumin: 4 g/dL (ref 3.5–5.2)
Alkaline Phosphatase: 53 U/L (ref 39–117)
Bilirubin, Direct: 0.1 mg/dL (ref 0.0–0.3)
Total Bilirubin: 0.5 mg/dL (ref 0.2–1.2)
Total Protein: 6.9 g/dL (ref 6.0–8.3)

## 2016-11-26 LAB — TSH: TSH: 1.44 u[IU]/mL (ref 0.35–4.50)

## 2016-11-26 LAB — VITAMIN B12: Vitamin B-12: 821 pg/mL (ref 211–911)

## 2016-11-28 DIAGNOSIS — Z23 Encounter for immunization: Secondary | ICD-10-CM | POA: Diagnosis not present

## 2016-12-09 ENCOUNTER — Encounter: Payer: Self-pay | Admitting: Internal Medicine

## 2016-12-31 DIAGNOSIS — M25562 Pain in left knee: Secondary | ICD-10-CM | POA: Diagnosis not present

## 2017-02-11 DIAGNOSIS — M25562 Pain in left knee: Secondary | ICD-10-CM | POA: Diagnosis not present

## 2017-04-21 ENCOUNTER — Telehealth: Payer: Self-pay | Admitting: Family Medicine

## 2017-04-21 DIAGNOSIS — D72829 Elevated white blood cell count, unspecified: Secondary | ICD-10-CM

## 2017-04-21 NOTE — Telephone Encounter (Signed)
Copied from [place]. Topic: Inquiry >> Apr 21, 2017 11:00 AM Aurelio Brash B wrote: Reason for CRM: PT is unclear about where she is to have  WBC lab work done,  she was not sure if it was Dr Velora Mediate office or the Cancer center that left her a message to schedule lab work - no apt has been made.  She states if the office does not get back with her today,  it will be Friday before she will be available  again as she is traveling tomorrow.  Best contact number is [phone]

## 2017-04-21 NOTE — Telephone Encounter (Signed)
Pt had labs deont 11/2016 and was advised to repeat CBC/D in 6 months ( around [place] or mid May).  No OV needed if symptom free.  Pt just needs to make a lab appt here at [facility] for blood draw.  Orders placed.

## 2017-04-23 NOTE — Telephone Encounter (Signed)
mychart message sent to pt to schedule her lab appt.

## 2017-05-08 DIAGNOSIS — M25562 Pain in left knee: Secondary | ICD-10-CM | POA: Diagnosis not present

## 2017-05-09 ENCOUNTER — Other Ambulatory Visit (INDEPENDENT_AMBULATORY_CARE_PROVIDER_SITE_OTHER): Payer: 59

## 2017-05-09 DIAGNOSIS — D72829 Elevated white blood cell count, unspecified: Secondary | ICD-10-CM

## 2017-05-09 LAB — CBC WITH DIFFERENTIAL/PLATELET
Basophils Absolute: 0 K/uL (ref 0.0–0.1)
Basophils Relative: 0.4 % (ref 0.0–3.0)
Eosinophils Absolute: 0.1 K/uL (ref 0.0–0.7)
Eosinophils Relative: 1.4 % (ref 0.0–5.0)
HCT: 39.8 % (ref 36.0–46.0)
Hemoglobin: 13.6 g/dL (ref 12.0–15.0)
Lymphocytes Relative: 37.7 % (ref 12.0–46.0)
Lymphs Abs: 1.7 K/uL (ref 0.7–4.0)
MCHC: 34.2 g/dL (ref 30.0–36.0)
MCV: 89.6 fl (ref 78.0–100.0)
Monocytes Absolute: 0.6 K/uL (ref 0.1–1.0)
Monocytes Relative: 12.6 % — ABNORMAL HIGH (ref 3.0–12.0)
Neutro Abs: 2.2 K/uL (ref 1.4–7.7)
Neutrophils Relative %: 47.9 % (ref 43.0–77.0)
Platelets: 222 K/uL (ref 150.0–400.0)
RBC: 4.44 Mil/uL (ref 3.87–5.11)
RDW: 13 % (ref 11.5–15.5)
WBC: 4.5 K/uL (ref 4.0–10.5)

## 2017-05-19 NOTE — Progress Notes (Signed)
Chief Complaint  Patient presents with  . Follow-up    no new concerns. Repeat CBC labs done 04/2017    HPI: Stacey Young 62 y.o. come in for   Fu lab bp etc  Doing ok  Vision issues  Readings quickly on monitors  Getting eye checked soon . bp has been in range  May need refill new pharm cause of  Insurance change  Has enough today .  No new sx cv pulm   Has been taking b12 w hx of low level in past  ROS: See pertinent positives and negatives per HPI. No neuropathy   Past Medical History:  Diagnosis Date  . Anemia   . Back pain   . Fibroids    uterine  . Hay fever   . Hx of colonoscopy    utd  . Hx of transfusion    w gall baldder surgery  donates blood ( thus eg screen)  . Hx of varicella    as a child  . IBS (irritable bowel syndrome)   . WBC decreased     Family History  Problem Relation Age of Onset  . Arthritis Mother   . Obstructive Sleep Apnea Mother        on CPAP  . Cancer Father        bladder  . Hyperlipidemia Father   . Migraines Sister   . Hyperlipidemia Sister        low wbc  . Dementia Unknown        older aunt uncle gm 77s and 11s    Social History   Socioeconomic History  . Marital status: Married    Spouse name: Not on file  . Number of children: Not on file  . Years of education: Not on file  . Highest education level: Not on file  Occupational History  . Not on file  Social Needs  . Financial resource strain: Not on file  . Food insecurity:    Worry: Not on file    Inability: Not on file  . Transportation needs:    Medical: Not on file    Non-medical: Not on file  Tobacco Use  . Smoking status: Never Smoker  . Smokeless tobacco: Never Used  Substance and Sexual Activity  . Alcohol use: Yes    Alcohol/week: 8.4 oz    Types: 14 Glasses of wine per week    Comment: "2 per night"   . Drug use: No  . Sexual activity: Not on file  Lifestyle  . Physical activity:    Days per week: Not on file    Minutes per session: Not  on file  . Stress: Not on file  Relationships  . Social connections:    Talks on phone: Not on file    Gets together: Not on file    Attends religious service: Not on file    Active member of club or organization: Not on file    Attends meetings of clubs or organizations: Not on file    Relationship status: Not on file  Other Topics Concern  . 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 losartan-hydrochlorothiazide (HYZAAR) 100-12.5 MG tablet Take 1 tablet daily by mouth. 90 tablet 3  . vitamin B-12 (CYANOCOBALAMIN) 100 MCG tablet Take 100 mcg by mouth daily.     No facility-administered medications prior to visit.      EXAM:  BP 110/60 (BP Location: Right Arm, Patient Position: Sitting, Cuff Size: Normal)   Pulse (!) 58   Temp 97.6 F (36.4 C) (Oral)   Wt 161 lb 1.6 oz (73.1 kg)   BMI 28.09 kg/m   Body mass index is 28.09 kg/m.  GENERAL: vitals reviewed and listed above, alert, oriented, appears well hydrated and in no acute distress looks well  HEENT: atraumatic, conjunctiva  clear, no obvious abnormalities on inspection of external nose and ears LUNGS: clear to auscultation bilaterally, no wheezes, rales or rhonchi, good air movement CV: HRRR, no clubbing cyanosis or  peripheral edema nl cap refill  MS: moves all extremities without noticeable focal  abnormality PSYCH: pleasant and cooperative, no obvious depression or anxiety Lab Results  Component Value Date   WBC 4.5 05/09/2017   HGB 13.6 05/09/2017   HCT 39.8 05/09/2017   PLT 222.0 05/09/2017   GLUCOSE 115 (H) 11/26/2016   CHOL 205 (H) 11/26/2016   TRIG 74.0  11/26/2016   HDL 72.00 11/26/2016   LDLCALC 118 (H) 11/26/2016   ALT 21 11/26/2016   AST 19 11/26/2016   NA 139 11/26/2016   K 3.9 11/26/2016   CL 104 11/26/2016   CREATININE 0.72 11/26/2016   BUN 14 11/26/2016   CO2 30 11/26/2016   TSH 1.44 11/26/2016   HGBA1C 5.4 05/20/2017   BP Readings from Last 3 Encounters:  05/20/17 110/60  11/12/16 108/64  11/01/16 128/78   Wt Readings from Last 3 Encounters:  05/20/17 161 lb 1.6 oz (73.1 kg)  11/12/16 158 lb 6.4 oz (71.8 kg)  11/01/16 156 lb 11.2 oz (71.1 kg)    Lab Results  Component Value Date   [id number] 11/26/2016  lab results reviewed   ASSESSMENT AND PLAN:  Discussed the following assessment and plan:  Essential hypertension - controlled   Medication management  Leukopenia, unspecified type - improved  .  follow   Low serum vitamin B12  Fasting hyperglycemia - Plan: POC HgB A1c  -Patient advised to return or notify health care team  if  new concerns arise.  Patient Instructions  Blood work is better . Hg a1c test is norrmal   Reassuring no diabetes  Get your eye checked .  Contact us or have pharmacy contact us electronically for refill .  When needed.  Otherwise plan  CPX and  Lab monitoring   In November when you have your yearly    Standley Brooking. Panosh M.D.

## 2017-05-20 ENCOUNTER — Encounter: Payer: Self-pay | Admitting: Internal Medicine

## 2017-05-20 ENCOUNTER — Ambulatory Visit: Payer: 59 | Admitting: Internal Medicine

## 2017-05-20 VITALS — BP 110/60 | HR 58 | Temp 97.6°F | Wt 161.1 lb

## 2017-05-20 DIAGNOSIS — I10 Essential (primary) hypertension: Secondary | ICD-10-CM

## 2017-05-20 DIAGNOSIS — D72819 Decreased white blood cell count, unspecified: Secondary | ICD-10-CM

## 2017-05-20 DIAGNOSIS — R7301 Impaired fasting glucose: Secondary | ICD-10-CM | POA: Diagnosis not present

## 2017-05-20 DIAGNOSIS — Z79899 Other long term (current) drug therapy: Secondary | ICD-10-CM | POA: Diagnosis not present

## 2017-05-20 DIAGNOSIS — E538 Deficiency of other specified B group vitamins: Secondary | ICD-10-CM

## 2017-05-20 LAB — POCT GLYCOSYLATED HEMOGLOBIN (HGB A1C): Hemoglobin A1C: 5.4

## 2017-05-20 NOTE — Patient Instructions (Addendum)
Blood work is better . Hg a1c test is norrmal   Reassuring no diabetes  Get your eye checked .  Contact us or have pharmacy contact us electronically for refill .  When needed.  Otherwise plan  CPX and  Lab monitoring   In November when you have your yearly

## 2017-05-30 ENCOUNTER — Other Ambulatory Visit (HOSPITAL_COMMUNITY): Payer: Self-pay | Admitting: Ophthalmology

## 2017-05-30 DIAGNOSIS — H539 Unspecified visual disturbance: Secondary | ICD-10-CM

## 2017-06-01 ENCOUNTER — Encounter: Payer: Self-pay | Admitting: Internal Medicine

## 2017-06-02 NOTE — Telephone Encounter (Signed)
Please advise Dr Panosh, thanks.   

## 2017-06-02 NOTE — Telephone Encounter (Signed)
Sorry you are having to go further on the evaluation.  Usually  The ordering provider writes for pre  Procedure sedation   Please contact  Your  Eye doc and  If they  Decline to help we  Will  Help out .   Please provide date and details  Or ordering  md of the MRI

## 2017-06-04 ENCOUNTER — Ambulatory Visit (HOSPITAL_COMMUNITY)
Admission: RE | Admit: 2017-06-04 | Discharge: 2017-06-04 | Disposition: A | Discharge status: Home / Self Care | Payer: 59 | Source: Ambulatory Visit | Attending: Ophthalmology | Admitting: Ophthalmology

## 2017-06-04 DIAGNOSIS — H538 Other visual disturbances: Secondary | ICD-10-CM | POA: Diagnosis not present

## 2017-06-04 DIAGNOSIS — H539 Unspecified visual disturbance: Secondary | ICD-10-CM | POA: Insufficient documentation

## 2017-06-04 LAB — POCT I-STAT CREATININE: Creatinine, Ser: 0.8 mg/dL (ref 0.44–1.00)

## 2017-06-04 MED ORDER — GADOBENATE DIMEGLUMINE 529 MG/ML IV SOLN
15.0000 mL | Freq: Once | INTRAVENOUS | Status: AC | PRN
  Administered 2017-06-04: 15 mL via INTRAVENOUS

## 2017-06-26 DIAGNOSIS — H539 Unspecified visual disturbance: Secondary | ICD-10-CM | POA: Diagnosis not present

## 2017-08-05 DIAGNOSIS — H539 Unspecified visual disturbance: Secondary | ICD-10-CM | POA: Diagnosis not present

## 2017-08-05 DIAGNOSIS — H538 Other visual disturbances: Secondary | ICD-10-CM | POA: Diagnosis not present

## 2017-08-16 ENCOUNTER — Encounter: Payer: Self-pay | Admitting: Internal Medicine

## 2017-08-29 ENCOUNTER — Telehealth: Payer: Self-pay | Admitting: Hematology

## 2017-08-29 NOTE — Telephone Encounter (Signed)
Printed records for patient, Release ID: [id number]

## 2017-10-27 ENCOUNTER — Other Ambulatory Visit: Payer: Self-pay | Admitting: Internal Medicine

## 2017-10-27 DIAGNOSIS — Z1231 Encounter for screening mammogram for malignant neoplasm of breast: Secondary | ICD-10-CM

## 2017-10-27 MED ORDER — LOSARTAN POTASSIUM-HCTZ 100-12.5 MG PO TABS: 1.0000 | ORAL_TABLET | Freq: Every day | ORAL | 5 refills | Status: DC

## 2017-11-12 NOTE — Progress Notes (Signed)
Chief Complaint  Patient presents with  . Annual Exam    Not fasting // eye issues // still having knee pain since surgery.     HPI: Patient  Stacey Young  62 y.o. comes in today for Preventive Health Care visit   BP:  controlled  No injury knee about 95%  Had onset of difficulty with ease  And fluency of readings at a presentation she was given and noted since then   When reading aloud stumbles    Words .  No lov  No headache  Numbness   vision  Had  Difficulty reading   A the teleprompter five ing a   Got mri  Of brain and then neurologist  And  Not fine  Despite .  Hard to  Read out loud  And keep place .  Went to [employer] [facility]   Health Maintenance  Topic Date Due  . Hepatitis C Screening  1955/12/25  . HIV Screening  06/07/1969  . INFLUENZA VACCINE  08/14/2017  . MAMMOGRAM  08/07/2018  . TETANUS/TDAP  11/14/2019  . COLONOSCOPY  12/16/2019   Health Maintenance Review LIFESTYLE:  Exercise:   Swim or walk   Tobacco/ETS: no Alcohol:  2 per day Sugar beverages: Sleep: about 7- 7.5  Drug use: no HH of  2 pet dog  Work: a lot  About  10 - 11 per day.  Still traveling  A good bit.    Mom has alzhiemers and  OSA . Ager 88 ROS:  nsores some but no osa sx   GEN/ HEENT: No fever, significant weight changes sweats headaches vision problems hearing changes, CV/ PULM; No chest pain shortness of breath cough, syncope,edema  change in exercise tolerance. GI /GU: No adominal pain, vomiting, change in bowel habits. No blood in the stool. No significant GU symptoms. SKIN/HEME: ,no acute skin rashes suspicious lesions or bleeding. No lymphadenopathy, nodules, masses.  NEURO/ PSYCH:  No neurologic signs such as weakness numbness. No depression anxiety. IMM/ Allergy: No unusual infections.  Allergy .   REST of 12 system review negative except as per HPI   Past Medical History:  Diagnosis Date  . Anemia   . Back pain   . Fibroids    uterine  . Hay fever   . Hx of colonoscopy    utd  . Hx of transfusion    w gall baldder surgery  donates blood ( thus eg screen)  . Hx of varicella    as a child  . IBS (irritable bowel syndrome)   . WBC decreased     Past Surgical History:  Procedure Laterality Date  . BLADDER REPAIR W/ CESAREAN SECTION  1987  . [place]  . VESICOVAGINAL FISTULA CLOSURE W/ TAH  2003   for fibroids and endometriosis    Family History  Problem Relation Age of Onset  . Arthritis Mother   . Obstructive Sleep Apnea Mother        on CPAP  . Cancer Father        bladder  . Hyperlipidemia Father   . Migraines Sister   . Hyperlipidemia Sister        low wbc  . Dementia Unknown        older aunt uncle gm 33s and 21s    Social History   Socioeconomic History  . Marital status: Married    Spouse name: Not on file  . Number of children: Not on file  .  Years of education: Not on file  . Highest education level: Not on file  Occupational History  . Not on file  Social Needs  . Financial resource strain: Not on file  . Food insecurity:    Worry: Not on file    Inability: Not on file  . Transportation needs:    Medical: Not on file    Non-medical: Not on file  Tobacco Use  . Smoking status: Never Smoker  . Smokeless tobacco: Never Used  Substance and Sexual Activity  . Alcohol use: Yes    Alcohol/week: 14.0 standard drinks    Types: 14 Glasses of wine per week    Comment: "2 per night"   . Drug use: No  . Sexual activity: Not on file  Lifestyle  . Physical activity:    Days per week: Not on file    Minutes per session: Not on file  . Stress: Not on file  Relationships  . Social connections:    Talks on phone: Not on file    Gets together: Not on file    Attends religious service: Not on file    Active member of club or organization: Not on file    Attends meetings of clubs or organizations: Not on file    Relationship status: Not on file  Other Topics Concern  . 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 losartan-hydrochlorothiazide (HYZAAR) 100-12.5 MG tablet Take 1 tablet by mouth daily. 30 tablet 5  . vitamin B-12 (CYANOCOBALAMIN) 100 MCG tablet Take 100 mcg by mouth daily.     No facility-administered medications prior to visit.      EXAM:  BP 122/70 (BP Location: Right Arm, Patient Position: Sitting, Cuff Size: Normal)   Pulse (!) 54   Temp 97.8 F (36.6 C) (Oral)   Ht 5' 3.58" (1.615 m)   Wt 160 lb 11.2 oz (72.9 kg)   BMI 27.95 kg/m   Body mass index is 27.95 kg/m. Wt Readings from Last 3 Encounters:  11/14/17 160 lb 11.2 oz (72.9 kg)  05/20/17 161 lb 1.6 oz (73.1 kg)  11/12/16 158 lb 6.4 oz (71.8 kg)    Physical Exam: Vital signs reviewed BOF:BPZW is a well-developed well-nourished alert cooperative    who appearsr stated age in no acute distress.  HEENT: normocephalic atraumatic , Eyes: PERRL EOM's full, conjunctiva clear, Nares: paten,t no deformity discharge or tenderness., Ears: no deformity EAC's clear TMs with normal landmarks. Mouth: clear OP, no lesions, edema.  Moist mucous membranes. Dentition in adequate repair. NECK: supple without masses, thyromegaly or bruits. CHEST/PULM:  Clear to auscultation and percussion breath sounds equal no wheeze , rales or rhonchi. No chest wall deformities or tenderness.Breast: normal by inspection . No dimpling, discharge, masses, tenderness or discharge . Breast: normal by inspection . No dimpling, discharge, masses, tenderness or discharge . CV: PMI is nondisplaced, S1 S2 no gallops, murmurs, rubs. Peripheral pulses are full without delay.No JVD .  ABDOMEN: Bowel sounds normal  nontender  No guard or rebound,  no hepato splenomegal no CVA tenderness.  No hernia. Extremtities:  No clubbing cyanosis or edema, no acute joint swelling or redness no focal atrophy NEURO:  Oriented x3, cranial nerves 3-12 appear to be intact, no obvious focal weakness,gait within normal limits no abnormal reflexes or asymmetrical SKIN: No acute rashes normal turgor, color, no bruising or petechiae. PSYCH: Oriented, good eye contact, no obvious depression anxiety, cognition and judgment appear normal. LN: no cervical axillary inguinal adenopathy  Lab Results  Component Value Date   WBC 4.5 05/09/2017   HGB 13.6 05/09/2017   HCT 39.8 05/09/2017   PLT 222.0 05/09/2017   GLUCOSE 115 (H) 11/26/2016   CHOL 205 (H) 11/26/2016   TRIG 74.0 11/26/2016   HDL 72.00 11/26/2016   LDLCALC 118 (H) 11/26/2016   ALT 21 11/26/2016   AST 19 11/26/2016   NA 139 11/26/2016   K 3.9 11/26/2016   CL 104 11/26/2016   CREATININE 0.80 06/04/2017   BUN 14 11/26/2016   CO2 30 11/26/2016   TSH 1.44 11/26/2016   HGBA1C 5.4 05/20/2017    BP Readings from Last 3 Encounters:  11/14/17 122/70  05/20/17 110/60  11/12/16 108/64   [place] sausages and toast.  And tea.  This am   Lab plan  disc . with patient .    Reviewed  Neuro evaluation from dr Primus Bravo    ASSESSMENT AND PLAN:  Discussed the following assessment and plan:  Visit for preventive health examination - Plan: Basic metabolic panel, CBC with Differential/Platelet, Hemoglobin A1c, Hepatic function panel, Lipid panel, TSH, Vitamin B12, POCT Urinalysis Dipstick (Automated)  Medication management - Plan: Basic metabolic panel, CBC with Differential/Platelet, Hemoglobin A1c, Hepatic function panel, Lipid panel, TSH, Vitamin B12, POCT Urinalysis Dipstick (Automated)  Essential hypertension - Plan: Basic metabolic panel, CBC with Differential/Platelet, Hemoglobin A1c, Hepatic function panel, Lipid panel, TSH, Vitamin B12  Hyperlipidemia, unspecified  hyperlipidemia type - Plan: Basic metabolic panel, CBC with Differential/Platelet, Hemoglobin A1c, Hepatic function panel, Lipid panel, TSH, Vitamin B12  Cognitive complaints with normal exam - Plan: Basic metabolic panel, CBC with Differential/Platelet, Hemoglobin A1c, Hepatic function panel, Lipid panel, TSH, Vitamin B12, POCT Urinalysis Dipstick (Automated), T4, free  Hyperglycemia - Plan: Basic metabolic panel, CBC with Differential/Platelet, Hemoglobin A1c, Hepatic function panel, Lipid panel, TSH, Vitamin B12, POCT Urinalysis Dipstick (Automated), T4, free Disc options about   Further evaluation  Wants to hold off on neuro testing  As rest is ok   Read to her the  Assessment of neurologist and   Was mor reassured that sx of concern were addressed .  It is possible that  Her processing speed for certain tasks  have changed and she has adapted   .  R/o metabolic today  Patient Care Team: Myrikal Messmer, Standley Brooking, MD as PCP - General Patient Instructions  Can consider  neurocognitive testing   To get more information.  Optimize sleep limit alcohol that can effect sleep quality .  Optimize cardiovascular  Risk factors  Exercise blood pressure and cholesterol . Etc  Checking for metabolic  Issues  Adapting cues are good .  If progressing sx at all  Advise  Fu neuro  But exam is good today .     Preventive Care 40-64 Years, Female Preventive care refers to lifestyle choices and visits with your health care provider that can promote health and wellness. What does preventive care include?  A yearly physical exam. This is also called an annual well check.  Dental exams  once or twice a year.  Routine eye exams. Ask your health care provider how often you should have your eyes checked.  Personal lifestyle choices, including: ? Daily care of your teeth and gums. ? Regular physical activity. ? Eating a healthy diet. ? Avoiding tobacco and drug use. ? Limiting alcohol use. ? Practicing safe  sex. ? Taking low-dose aspirin daily starting at age 49. ? Taking vitamin and mineral supplements as recommended by your health care provider. What happens during an annual well check? The services and screenings done by your health care provider during your annual well check will depend on your age, overall health, lifestyle risk factors, and family history of disease. Counseling Your health care provider may ask you questions about your:  Alcohol use.  Tobacco use.  Drug use.  Emotional well-being.  Home and relationship well-being.  Sexual activity.  Eating habits.  Work and work Statistician.  Method of birth control.  Menstrual cycle.  Pregnancy history.  Screening You may have the following tests or measurements:  Height, weight, and BMI.  Blood pressure.  Lipid and cholesterol levels. These may be checked every 5 years, or more frequently if you are over 30 years old.  Skin check.  Lung cancer screening. You may have this screening every year starting at age 44 if you have a 30-pack-year history of smoking and currently smoke or have quit within the past 15 years.  Fecal occult blood test (FOBT) of the stool. You may have this test every year starting at age 39.  Flexible sigmoidoscopy or colonoscopy. You may have a sigmoidoscopy every 5 years or a colonoscopy every 10 years starting at age 80.  Hepatitis C blood test.  Hepatitis B blood test.  Sexually transmitted disease (STD) testing.  Diabetes screening. This is done by checking your blood sugar (glucose) after you have not eaten for a while (fasting). You may have this done every 1-3 years.  Mammogram. This may be done every 1-2 years. Talk to your health care provider about when you should start having regular mammograms. This may depend on whether you have a family history of breast cancer.  BRCA-related cancer screening. This may be done if you have a family history of breast, ovarian, tubal, or  peritoneal cancers.  Pelvic exam and Pap test. This may be done every 3 years starting at age 21. Starting at age 66, this may be done every 5 years if you have a Pap test in combination with an HPV test.  Bone density scan. This is done to screen for osteoporosis. You may have this scan if you are at high risk for osteoporosis.  Discuss your test results, treatment options, and if necessary, the need for more tests with your health care provider. Vaccines Your health care provider may recommend certain vaccines, such as:  Influenza vaccine. This is recommended every year.  Tetanus, diphtheria, and acellular pertussis (Tdap, Td) vaccine. You may need a Td booster every 10 years.  Varicella vaccine. You may need this if you have not been vaccinated.  Zoster vaccine. You may need this after age 65.  Measles, mumps, and rubella (MMR) vaccine. You may need at least one dose of MMR if you were born in 1957 or later. You may also need a second dose.  Pneumococcal 13-valent conjugate (PCV13) vaccine. You may need this if you have certain conditions and were not previously vaccinated.  Pneumococcal polysaccharide (PPSV23) vaccine. You may need one or two doses if you  smoke cigarettes or if you have certain conditions.  Meningococcal vaccine. You may need this if you have certain conditions.  Hepatitis A vaccine. You may need this if you have certain conditions or if you travel or work in places where you may be exposed to hepatitis A.  Hepatitis B vaccine. You may need this if you have certain conditions or if you travel or work in places where you may be exposed to hepatitis B.  Haemophilus influenzae type b (Hib) vaccine. You may need this if you have certain conditions.  Talk to your health care provider about which screenings and vaccines you need and how often you need them. This information is not intended to replace advice given to you by your health care provider. Make sure you  discuss any questions you have with your health care provider. Document Released: 01/27/2015 Document Revised: 09/20/2015 Document Reviewed: 11/01/2014 Elsevier Interactive Patient Education  2018 [place] Kalvyn Desa M.D.

## 2017-11-14 ENCOUNTER — Encounter: Payer: Self-pay | Admitting: Internal Medicine

## 2017-11-14 ENCOUNTER — Ambulatory Visit (INDEPENDENT_AMBULATORY_CARE_PROVIDER_SITE_OTHER): Payer: 59 | Admitting: Internal Medicine

## 2017-11-14 VITALS — BP 122/70 | HR 54 | Temp 97.8°F | Ht 63.58 in | Wt 160.7 lb

## 2017-11-14 DIAGNOSIS — Z0000 Encounter for general adult medical examination without abnormal findings: Secondary | ICD-10-CM

## 2017-11-14 DIAGNOSIS — I10 Essential (primary) hypertension: Secondary | ICD-10-CM | POA: Diagnosis not present

## 2017-11-14 DIAGNOSIS — R419 Unspecified symptoms and signs involving cognitive functions and awareness: Secondary | ICD-10-CM

## 2017-11-14 DIAGNOSIS — Z23 Encounter for immunization: Secondary | ICD-10-CM

## 2017-11-14 DIAGNOSIS — Z79899 Other long term (current) drug therapy: Secondary | ICD-10-CM

## 2017-11-14 DIAGNOSIS — E785 Hyperlipidemia, unspecified: Secondary | ICD-10-CM

## 2017-11-14 DIAGNOSIS — R739 Hyperglycemia, unspecified: Secondary | ICD-10-CM

## 2017-11-14 LAB — BASIC METABOLIC PANEL
BUN: 21 mg/dL (ref 6–23)
CALCIUM: 9.4 mg/dL (ref 8.4–10.5)
CO2: 30 meq/L (ref 19–32)
CREATININE: 0.78 mg/dL (ref 0.40–1.20)
Chloride: 104 mEq/L (ref 96–112)
GFR: 95.8 mL/min (ref >60.00)
GLUCOSE: 85 mg/dL (ref 70–99)
Potassium: 4.4 mEq/L (ref 3.5–5.1)
Sodium: 140 mEq/L (ref 135–145)

## 2017-11-14 LAB — HEPATIC FUNCTION PANEL
ALT: 18 U/L (ref 0–35)
AST: 17 U/L (ref 0–37)
Albumin: 4.2 g/dL (ref 3.5–5.2)
Alkaline Phosphatase: 63 U/L (ref 39–117)
BILIRUBIN DIRECT: 0.1 mg/dL (ref 0.0–0.3)
TOTAL PROTEIN: 6.9 g/dL (ref 6.0–8.3)
Total Bilirubin: 0.5 mg/dL (ref 0.2–1.2)

## 2017-11-14 LAB — LIPID PANEL
CHOL/HDL RATIO: 3
Cholesterol: 194 mg/dL (ref 0–200)
HDL: 65.4 mg/dL (ref >39.00)
LDL Cholesterol: 109 mg/dL — ABNORMAL HIGH (ref 0–99)
NonHDL: 128.89
TRIGLYCERIDES: 99 mg/dL (ref 0.0–149.0)
VLDL: 19.8 mg/dL (ref 0.0–40.0)

## 2017-11-14 LAB — POC URINALSYSI DIPSTICK (AUTOMATED)
Bilirubin, UA: NEGATIVE
Blood, UA: NEGATIVE
Glucose, UA: NEGATIVE
KETONES UA: NEGATIVE
Leukocytes, UA: NEGATIVE
Nitrite, UA: NEGATIVE
PROTEIN UA: NEGATIVE
Spec Grav, UA: 1.025 (ref 1.010–1.025)
UROBILINOGEN UA: 0.2 U/dL
pH, UA: 6.5 (ref 5.0–8.0)

## 2017-11-14 LAB — T4, FREE: FREE T4: 0.88 ng/dL (ref 0.60–1.60)

## 2017-11-14 LAB — CBC WITH DIFFERENTIAL/PLATELET
BASOS ABS: 0 10*3/uL (ref 0.0–0.1)
Basophils Relative: 1.2 % (ref 0.0–3.0)
EOS ABS: 0 10*3/uL (ref 0.0–0.7)
Eosinophils Relative: 1.3 % (ref 0.0–5.0)
HCT: 41.6 % (ref 36.0–46.0)
Hemoglobin: 14.2 g/dL (ref 12.0–15.0)
LYMPHS ABS: 1.7 10*3/uL (ref 0.7–4.0)
Lymphocytes Relative: 51 % — ABNORMAL HIGH (ref 12.0–46.0)
MCHC: 34.1 g/dL (ref 30.0–36.0)
MCV: 91 fl (ref 78.0–100.0)
MONO ABS: 0.4 10*3/uL (ref 0.1–1.0)
MONOS PCT: 13.5 % — AB (ref 3.0–12.0)
NEUTROS ABS: 1.1 10*3/uL — AB (ref 1.4–7.7)
NEUTROS PCT: 33 % — AB (ref 43.0–77.0)
PLATELETS: 239 10*3/uL (ref 150.0–400.0)
RBC: 4.57 Mil/uL (ref 3.87–5.11)
RDW: 13 % (ref 11.5–15.5)
WBC: 3.3 10*3/uL — ABNORMAL LOW (ref 4.0–10.5)

## 2017-11-14 LAB — VITAMIN B12: Vitamin B-12: 753 pg/mL (ref 211–911)

## 2017-11-14 LAB — HEMOGLOBIN A1C: Hgb A1c MFr Bld: 5.7 % (ref 4.6–6.5)

## 2017-11-14 LAB — TSH: TSH: 1.17 u[IU]/mL (ref 0.35–4.50)

## 2017-11-14 NOTE — Patient Instructions (Addendum)
Can consider  neurocognitive testing   To get more information.  Optimize sleep limit alcohol that can effect sleep quality .  Optimize cardiovascular  Risk factors  Exercise blood pressure and cholesterol . Etc  Checking for metabolic  Issues  Adapting cues are good .  If progressing sx at all  Advise  Fu neuro  But exam is good today .     Preventive Care 40-64 Years, Female Preventive care refers to lifestyle choices and visits with your health care provider that can promote health and wellness. What does preventive care include?  A yearly physical exam. This is also called an annual well check.  Dental exams once or twice a year.  Routine eye exams. Ask your health care provider how often you should have your eyes checked.  Personal lifestyle choices, including: ? Daily care of your teeth and gums. ? Regular physical activity. ? Eating a healthy diet. ? Avoiding tobacco and drug use. ? Limiting alcohol use. ? Practicing safe sex. ? Taking low-dose aspirin daily starting at age 27. ? Taking vitamin and mineral supplements as recommended by your health care provider. What happens during an annual well check? The services and screenings done by your health care provider during your annual well check will depend on your age, overall health, lifestyle risk factors, and family history of disease. Counseling Your health care provider may ask you questions about your:  Alcohol use.  Tobacco use.  Drug use.  Emotional well-being.  Home and relationship well-being.  Sexual activity.  Eating habits.  Work and work Statistician.  Method of birth control.  Menstrual cycle.  Pregnancy history.  Screening You may have the following tests or measurements:  Height, weight, and BMI.  Blood pressure.  Lipid and cholesterol levels. These may be checked every 5 years, or more frequently if you are over 87 years old.  Skin check.  Lung cancer screening. You may have  this screening every year starting at age 60 if you have a 30-pack-year history of smoking and currently smoke or have quit within the past 15 years.  Fecal occult blood test (FOBT) of the stool. You may have this test every year starting at age 1.  Flexible sigmoidoscopy or colonoscopy. You may have a sigmoidoscopy every 5 years or a colonoscopy every 10 years starting at age 28.  Hepatitis C blood test.  Hepatitis B blood test.  Sexually transmitted disease (STD) testing.  Diabetes screening. This is done by checking your blood sugar (glucose) after you have not eaten for a while (fasting). You may have this done every 1-3 years.  Mammogram. This may be done every 1-2 years. Talk to your health care provider about when you should start having regular mammograms. This may depend on whether you have a family history of breast cancer.  BRCA-related cancer screening. This may be done if you have a family history of breast, ovarian, tubal, or peritoneal cancers.  Pelvic exam and Pap test. This may be done every 3 years starting at age 11. Starting at age 22, this may be done every 5 years if you have a Pap test in combination with an HPV test.  Bone density scan. This is done to screen for osteoporosis. You may have this scan if you are at high risk for osteoporosis.  Discuss your test results, treatment options, and if necessary, the need for more tests with your health care provider. Vaccines Your health care provider may recommend certain vaccines, such as:  Influenza vaccine. This is recommended every year.  Tetanus, diphtheria, and acellular pertussis (Tdap, Td) vaccine. You may need a Td booster every 10 years.  Varicella vaccine. You may need this if you have not been vaccinated.  Zoster vaccine. You may need this after age 18.  Measles, mumps, and rubella (MMR) vaccine. You may need at least one dose of MMR if you were born in 1957 or later. You may also need a second  dose.  Pneumococcal 13-valent conjugate (PCV13) vaccine. You may need this if you have certain conditions and were not previously vaccinated.  Pneumococcal polysaccharide (PPSV23) vaccine. You may need one or two doses if you smoke cigarettes or if you have certain conditions.  Meningococcal vaccine. You may need this if you have certain conditions.  Hepatitis A vaccine. You may need this if you have certain conditions or if you travel or work in places where you may be exposed to hepatitis A.  Hepatitis B vaccine. You may need this if you have certain conditions or if you travel or work in places where you may be exposed to hepatitis B.  Haemophilus influenzae type b (Hib) vaccine. You may need this if you have certain conditions.  Talk to your health care provider about which screenings and vaccines you need and how often you need them. This information is not intended to replace advice given to you by your health care provider. Make sure you discuss any questions you have with your health care provider. Document Released: 01/27/2015 Document Revised: 09/20/2015 Document Reviewed: 11/01/2014 Elsevier Interactive Patient Education  [employer].    .

## 2017-12-03 ENCOUNTER — Ambulatory Visit
Admission: RE | Admit: 2017-12-03 | Discharge: 2017-12-03 | Disposition: A | Discharge status: Home / Self Care | Payer: 59 | Source: Ambulatory Visit | Attending: Internal Medicine | Admitting: Internal Medicine

## 2017-12-03 DIAGNOSIS — Z1231 Encounter for screening mammogram for malignant neoplasm of breast: Secondary | ICD-10-CM

## 2017-12-14 ENCOUNTER — Other Ambulatory Visit: Payer: Self-pay | Admitting: Internal Medicine

## 2017-12-30 ENCOUNTER — Other Ambulatory Visit: Payer: Self-pay | Admitting: Internal Medicine

## 2017-12-30 DIAGNOSIS — H04123 Dry eye syndrome of bilateral lacrimal glands: Secondary | ICD-10-CM | POA: Diagnosis not present

## 2018-01-29 ENCOUNTER — Encounter: Payer: Self-pay | Admitting: Internal Medicine

## 2018-01-29 ENCOUNTER — Ambulatory Visit: Payer: 59 | Admitting: Internal Medicine

## 2018-01-29 VITALS — BP 138/82 | HR 64 | Temp 97.9°F | Wt 158.9 lb

## 2018-01-29 DIAGNOSIS — H04129 Dry eye syndrome of unspecified lacrimal gland: Secondary | ICD-10-CM | POA: Diagnosis not present

## 2018-01-29 DIAGNOSIS — H579 Unspecified disorder of eye and adnexa: Secondary | ICD-10-CM

## 2018-01-29 DIAGNOSIS — R51 Headache: Secondary | ICD-10-CM | POA: Diagnosis not present

## 2018-01-29 DIAGNOSIS — R519 Headache, unspecified: Secondary | ICD-10-CM

## 2018-01-29 MED ORDER — DOXYCYCLINE HYCLATE 100 MG PO CAPS: 100.0000 mg | ORAL_CAPSULE | Freq: Two times a day (BID) | ORAL | 0 refills | Status: AC

## 2018-01-29 NOTE — Progress Notes (Signed)
Chief Complaint  Patient presents with  . Eye Drainage    in right eye. Been "tearing" up since yesterday. Had crusted over last night and then had a pain that felt like a big "knot " that started around her eyey then ,moved to her jaw and then moved back up to her eye. Eyes still feel crusty and itchy.     HPI: Stacey Young 63 y.o. come in for  SDA   Right eye   Having issues onset A month ago w  Dry eye syndrome     Tearing  Ad side effect.  So now using tears     Yesterday   Itchy and then last night  Medial  Paranasal paint hat ketp her awake .   Felt pressure in face and  Now then jaw   Eye crusted after that  And then pain all but gone   diduse eye drops today incase but better  No fever .   No neuralgia or rash  No change in vision no redness of eyeball EOMs are full in the process of changing doctors. No history of headache migraine like this no fever was wondering if she had a sinus problem but no increase in congestion. ROS: See pertinent positives and negatives per HPI.  Past Medical History:  Diagnosis Date  . Anemia   . Back pain   . Fibroids    uterine  . Hay fever   . Hx of colonoscopy    utd  . Hx of transfusion    w gall baldder surgery  donates blood ( thus eg screen)  . Hx of varicella    as a child  . IBS (irritable bowel syndrome)   . WBC decreased     Family History  Problem Relation Age of Onset  . Arthritis Mother   . Obstructive Sleep Apnea Mother        on CPAP  . Cancer Father        bladder  . Hyperlipidemia Father   . Migraines Sister   . Hyperlipidemia Sister        low wbc  . Dementia Unknown        older aunt uncle gm 35s and 36s    Social History   Socioeconomic History  . Marital status: Married    Spouse name: Not on file  . Number of children: Not on file  . Years of education: Not on file  . Highest education level: Not on file  Occupational History  . Not on file  Social Needs  . Financial resource strain: Not  on file  . Food insecurity:    Worry: Not on file    Inability: Not on file  . Transportation needs:    Medical: Not on file    Non-medical: Not on file  Tobacco Use  . Smoking status: Never Smoker  . Smokeless tobacco: Never Used  Substance and Sexual Activity  . Alcohol use: Yes    Alcohol/week: 14.0 standard drinks    Types: 14 Glasses of wine per week    Comment: "2 per night"   . Drug use: No  . Sexual activity: Not on file  Lifestyle  . Physical activity:    Days per week: Not on file    Minutes per session: Not on file  . Stress: Not on file  Relationships  . Social connections:    Talks on phone: Not on file    Gets together: Not  on file    Attends religious service: Not on file    Active member of club or organization: Not on file    Attends meetings of clubs or organizations: Not on file    Relationship status: Not on file  Other Topics Concern  . 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 losartan-hydrochlorothiazide (HYZAAR) 100-12.5 MG tablet TAKE 1 TABLET BY MOUTH  DAILY 90 tablet 1  . vitamin B-12 (CYANOCOBALAMIN) 100 MCG tablet Take 100 mcg by mouth daily.    Marland Kitchen XIIDRA 5 % SOLN INSTILL 1 DROP INTO BOTH EYES BID     No facility-administered medications prior to visit.      EXAM:  BP 138/82 (BP Location: Right Arm, Patient Position: Sitting, Cuff Size: Normal)   Pulse 64   Temp 97.9 F (36.6 C) (Oral)   Wt 158 lb 14.4 oz (72.1 kg)   BMI 27.63 kg/m   Body mass index is 27.63 kg/m.  GENERAL: vitals reviewed and listed above, alert, oriented, appears well hydrated and in no acute distress HEENT:  atraumatic, conjunctiva  clear, no obvious abnormalities on inspection of external nose and ears TMs are clear conjunctiva appear clear although there is a slight crust at the medial canthus on the right there is no swelling or tenderness of the ethmoid or cystic structure.  There is some clear discharge tearing.  OP : no lesion edema or exudate  NECK: no obvious masses on inspection palpation  PSYCH: pleasant and cooperative, no obvious depression or anxiety  BP Readings from Last 3 Encounters:  01/29/18 138/82  11/14/17 122/70  05/20/17 110/60    ASSESSMENT AND PLAN:  Discussed the following assessment and plan:  Face pain - resolved  Eye symptom  Dry eye Uncertain cause of the pain that she had last night localized to the ethmoid region but cannot explain radiating to the jaw less likely but possible is nasolacrimal or dacryocystitis but would not of expected improvement at this time so rapidly At this time printed prescription of antibiotic given in case needed otherwise advise she see eye doctor for further.  No other alarm findings on exam today and she does not have "pinkeye". -Patient advised to return or notify health care team  if  new concerns arise.  Patient Instructions  Compresses  And  Advise eye evaluation  But  If  Tenderness swelling  Add antibiotic.   Atypical migraine?  atypical for pain.    See eye doc  Itching   usually allergy or irritation and not bacterial.      Standley Brooking. Panosh M.D.

## 2018-01-29 NOTE — Patient Instructions (Addendum)
Compresses  And  Advise eye evaluation  But  If  Tenderness swelling  Add antibiotic.   Atypical migraine?  atypical for pain.    See eye doc  Itching   usually allergy or irritation and not bacterial.

## 2018-06-08 ENCOUNTER — Other Ambulatory Visit: Payer: Self-pay | Admitting: Internal Medicine

## 2018-09-01 DIAGNOSIS — Z8669 Personal history of other diseases of the nervous system and sense organs: Secondary | ICD-10-CM

## 2018-09-04 NOTE — Telephone Encounter (Signed)
Sorry she is still having problems  Please do the referral  But they may need the  Previous records Please  Confirm   Symptoms poss dignoses to put on the referral ( I di see her in January for eye sx )

## 2018-10-01 ENCOUNTER — Encounter: Payer: Self-pay | Admitting: Internal Medicine

## 2018-11-22 ENCOUNTER — Other Ambulatory Visit: Payer: Self-pay | Admitting: Internal Medicine

## 2018-11-30 ENCOUNTER — Other Ambulatory Visit: Payer: Self-pay

## 2018-11-30 ENCOUNTER — Encounter: Payer: Self-pay | Admitting: Internal Medicine

## 2018-11-30 ENCOUNTER — Ambulatory Visit (INDEPENDENT_AMBULATORY_CARE_PROVIDER_SITE_OTHER): Payer: 59 | Admitting: Internal Medicine

## 2018-11-30 VITALS — BP 120/78 | HR 55 | Temp 97.2°F | Ht 64.0 in | Wt 152.2 lb

## 2018-11-30 DIAGNOSIS — Z1159 Encounter for screening for other viral diseases: Secondary | ICD-10-CM

## 2018-11-30 DIAGNOSIS — D72819 Decreased white blood cell count, unspecified: Secondary | ICD-10-CM | POA: Diagnosis not present

## 2018-11-30 DIAGNOSIS — I10 Essential (primary) hypertension: Secondary | ICD-10-CM

## 2018-11-30 DIAGNOSIS — Z114 Encounter for screening for human immunodeficiency virus [HIV]: Secondary | ICD-10-CM

## 2018-11-30 DIAGNOSIS — Z0000 Encounter for general adult medical examination without abnormal findings: Secondary | ICD-10-CM | POA: Diagnosis not present

## 2018-11-30 DIAGNOSIS — Z79899 Other long term (current) drug therapy: Secondary | ICD-10-CM

## 2018-11-30 DIAGNOSIS — E538 Deficiency of other specified B group vitamins: Secondary | ICD-10-CM

## 2018-11-30 NOTE — Progress Notes (Signed)
Chief Complaint  Patient presents with  . Annual Exam    pt has no concerns    HPI: Patient  Stacey Young  63 y.o. comes in today for Preventive Health Care visit  . Bp in range  Knee  Bothersome still abel to exercise Evaluation at [facility] to get neuro psych testing   ik eval except  Shapes  Hard to delineate on some visual testing  ? Alexia   Health Maintenance  Topic Date Due  . Hepatitis C Screening  11/25/1955  . HIV Screening  06/07/1969  . TETANUS/TDAP  11/14/2019  . MAMMOGRAM  12/04/2019  . COLONOSCOPY  12/16/2019  . 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 GI /GU: No adominal pain, vomiting, change in bowel habits. No blood in the stool. No significant GU symptoms. SKIN/HEME: ,no acute skin rashes suspicious lesions or bleeding. No lymphadenopathy, nodules, masses.  NEURO/ PSYCH:  No neurologic signs such as weakness numbness. No depression anxiety. IMM/ Allergy: No unusual infections.  Allergy .   REST of 12 system review negative except as per HPI   Past Medical History:  Diagnosis Date  . Anemia   . Back pain   . Fibroids    uterine  . Hay fever   . Hx of colonoscopy    utd  . Hx of transfusion    w gall baldder surgery  donates blood ( thus eg screen)  . Hx of varicella    as a child  . IBS (irritable bowel syndrome)   . WBC decreased     Past Surgical History:  Procedure Laterality Date  . BLADDER REPAIR W/ CESAREAN SECTION  1987  . [place]  . VESICOVAGINAL FISTULA CLOSURE W/ TAH  2003   for fibroids and endometriosis     Family History  Problem Relation Age of Onset  . Arthritis Mother   . Obstructive Sleep Apnea Mother        on CPAP  . Cancer Father        bladder  . Hyperlipidemia Father   . Migraines Sister   . Hyperlipidemia Sister        low wbc  . Dementia Unknown        older aunt uncle gm 56s and 31s    Social History   Socioeconomic History  . Marital status: Married    Spouse name: Not on file  . Number of children: Not on file  . Years of education: Not on file  . Highest education level: Not on file  Occupational History  . Not on file  Social Needs  . Financial resource strain: Not on file  . Food insecurity    Worry: Not on file    Inability: Not on file  . Transportation needs    Medical: Not on file    Non-medical: Not on file  Tobacco Use  . Smoking status: Never Smoker  . Smokeless tobacco: Never Used  Substance and Sexual Activity  . Alcohol use: Yes    Alcohol/week: 14.0 standard drinks  Types: 14 Glasses of wine per week    Comment: "2 per night"   . Drug use: No  . Sexual activity: Not on file  Lifestyle  . Physical activity    Days per week: Not on file    Minutes per session: Not on file  . Stress: Not on file  Relationships  . Social Herbalist on phone: Not on file    Gets together: Not on file    Attends religious service: Not on file    Active member of club or organization: Not on file    Attends meetings of clubs or organizations: Not on file    Relationship status: Not on file  Other Topics Concern  . Not on file  Social History Narrative   Never Smoked   Alcohol use-yes about 2 per day red wine   Regular Exercise-yes   Drug use- no   hh of 2    Pet dog   Married education PhD   Education officer, museum a lot internationally 2 weeks out of each month about   orig from [place] Hx    Outpatient Medications Prior to Visit  Medication Sig Dispense Refill  . losartan-hydrochlorothiazide  (HYZAAR) 100-12.5 MG tablet TAKE 1 TABLET BY MOUTH  DAILY 90 tablet 3  . vitamin B-12 (CYANOCOBALAMIN) 100 MCG tablet Take 100 mcg by mouth daily.    Marland Kitchen XIIDRA 5 % SOLN INSTILL 1 DROP INTO BOTH EYES BID     No facility-administered medications prior to visit.      EXAM:  BP 120/78 (BP Location: Right Arm, Patient Position: Sitting, Cuff Size: Normal)   Pulse (!) 55   Temp (!) 97.2 F (36.2 C) (Temporal)   Ht 5\' 4"  (1.626 m)   Wt 152 lb 3.2 oz (69 kg)   SpO2 99%   BMI 26.13 kg/m   Body mass index is 26.13 kg/m. Wt Readings from Last 3 Encounters:  11/30/18 152 lb 3.2 oz (69 kg)  01/29/18 158 lb 14.4 oz (72.1 kg)  11/14/17 160 lb 11.2 oz (72.9 kg)    Physical Exam: Vital signs reviewed [id number] is a well-developed well-nourished alert cooperative    who appearsr stated age in no acute distress.  HEENT: normocephalic atraumatic , Eyes: PERRL EOM's full, conjunctiva clear, Nares: paten,t no deformity discharge or tenderness., Ears: no deformity EAC's clear TMs with normal landmarks. Mouth: masked NECK: supple without masses, thyromegaly or bruits. CHEST/PULM:  Clear to auscultation and percussion breath sounds equal no wheeze , rales or rhonchi. No chest wall deformities or tenderness. Breast: normal by inspection . No dimpling, discharge, masses, tenderness or discharge . CV: PMI is nondisplaced, S1 S2 no gallops, murmurs, rubs. Peripheral pulses are full without delay.No JVD .  ABDOMEN: Bowel sounds normal nontender  No guard or rebound, no hepato splenomegal no CVA tenderness.  . Extremtities:  No clubbing cyanosis or edema, no acute joint swelling or redness no focal atrophy NEURO:  Oriented x3, cranial nerves 3-12 appear to be intact, no obvious focal weakness,gait within normal limits no abnormal reflexes or asymmetrical SKIN: No acute rashes normal turgor, color, no bruising or petechiae. PSYCH: Oriented, good eye contact, no obvious depression anxiety, cognition and judgment  appear normal. LN: no cervical axillary inguinal adenopathy  Lab Results  Component Value Date   WBC 3.3 (L) 11/14/2017   HGB 14.2 11/14/2017   HCT 41.6 11/14/2017   PLT 239.0 11/14/2017  GLUCOSE 85 11/14/2017   CHOL 194 11/14/2017   TRIG 99.0 11/14/2017   HDL 65.40 11/14/2017   LDLCALC 109 (H) 11/14/2017   ALT 18 11/14/2017   AST 17 11/14/2017   NA 140 11/14/2017   K 4.4 11/14/2017   CL 104 11/14/2017   CREATININE 0.78 11/14/2017   BUN 21 11/14/2017   CO2 30 11/14/2017   TSH 1.17 11/14/2017   HGBA1C 5.7 11/14/2017    BP Readings from Last 3 Encounters:  11/30/18 120/78  01/29/18 138/82  11/14/17 122/70    fasting Lab plan  reviewed with patient   ASSESSMENT AND PLAN:  Discussed the following assessment and plan:    ICD-10-CM   1. Visit for preventive health examination  123456 Basic metabolic panel    CBC with Differential    Hepatic function panel    Lipid panel    TSH    B12   under eval for poss alexia   2. Medication management  123456 Basic metabolic panel    CBC with Differential    Hepatic function panel    Lipid panel    TSH    B12  3. Essential hypertension  99991111 Basic metabolic panel    CBC with Differential    Hepatic function panel    Lipid panel    TSH    B12  4. Leukopenia, unspecified type  [id number] Basic metabolic panel    CBC with Differential    Hepatic function panel    Lipid panel    TSH    B12  5. Low serum vitamin B12  0000000 Basic metabolic panel    CBC with Differential    Hepatic function panel    Lipid panel    TSH    B12   nllast year   6. Need for hepatitis C screening test  Z11.59 Hepatitis C antibody  7. Screening for HIV (human immunodeficiency virus)  Z11.4 HIV Antibody (Reflex)  lab and follow up as indicated Or yearly  Return in about 1 year (around 11/30/2019) for preventive /cpx and medications , depending on labs.   Patient Care Team: Burnis Medin, MD as PCP - General Patient Instructions   Will  notify you  of labs when available.   Continue lifestyle intervention healthy eating and exercise .    Health Maintenance, Female Adopting a healthy lifestyle and getting preventive care are important in promoting health and wellness. Ask your health care provider about:  The right schedule for you to have regular tests and exams.  Things you can do on your own to prevent diseases and keep yourself healthy. What should I know about diet, weight, and exercise? Eat a healthy diet   Eat a diet that includes plenty of vegetables, fruits, low-fat dairy products, and lean protein.  Do not eat a lot of foods that are high in solid fats, added sugars, or sodium. Maintain a healthy weight Body mass index (BMI) is used to identify weight problems. It estimates body fat based on height and weight. Your health care provider can help determine your BMI and help you achieve or maintain a healthy weight. Get regular exercise Get regular exercise. This is one of the most important things you can do for your health. Most adults should:  Exercise for at least 150 minutes each week. The exercise should increase your heart rate and make you sweat (moderate-intensity exercise).  Do strengthening exercises at least twice a week. This is in addition to the moderate-intensity  exercise.  Spend less time sitting. Even light physical activity can be beneficial. Watch cholesterol and blood lipids Have your blood tested for lipids and cholesterol at 64 years of age, then have this test every 5 years. Have your cholesterol levels checked more often if:  Your lipid or cholesterol levels are high.  You are older than 64 years of age.  You are at high risk for heart disease. What should I know about cancer screening? Depending on your health history and family history, you may need to have cancer screening at various ages. This may include screening for:  Breast cancer.  Cervical cancer.  Colorectal  cancer.  Skin cancer.  Lung cancer. What should I know about heart disease, diabetes, and high blood pressure? Blood pressure and heart disease  High blood pressure causes heart disease and increases the risk of stroke. This is more likely to develop in people who have high blood pressure readings, are of African descent, or are overweight.  Have your blood pressure checked: ? Every 3-5 years if you are 7-41 years of age. ? Every year if you are 44 years old or older. Diabetes Have regular diabetes screenings. This checks your fasting blood sugar level. Have the screening done:  Once every three years after age 73 if you are at a normal weight and have a low risk for diabetes.  More often and at a younger age if you are overweight or have a high risk for diabetes. What should I know about preventing infection? Hepatitis B If you have a higher risk for hepatitis B, you should be screened for this virus. Talk with your health care provider to find out if you are at risk for hepatitis B infection. Hepatitis C Testing is recommended for:  Everyone born from [birth year] through 1965.  Anyone with known risk factors for hepatitis C. Sexually transmitted infections (STIs)  Get screened for STIs, including gonorrhea and chlamydia, if: ? You are sexually active and are younger than 64 years of age. ? You are older than 64 years of age and your health care provider tells you that you are at risk for this type of infection. ? Your sexual activity has changed since you were last screened, and you are at increased risk for chlamydia or gonorrhea. Ask your health care provider if you are at risk.  Ask your health care provider about whether you are at high risk for HIV. Your health care provider may recommend a prescription medicine to help prevent HIV infection. If you choose to take medicine to prevent HIV, you should first get tested for HIV. You should then be tested every 3 months for as long as  you are taking the medicine. Pregnancy  If you are about to stop having your period (premenopausal) and you may become pregnant, seek counseling before you get pregnant.  Take 400 to 800 micrograms (mcg) of folic acid every day if you become pregnant.  Ask for birth control (contraception) if you want to prevent pregnancy. Osteoporosis and menopause Osteoporosis is a disease in which the bones lose minerals and strength with aging. This can result in bone fractures. If you are 44 years old or older, or if you are at risk for osteoporosis and fractures, ask your health care provider if you should:  Be screened for bone loss.  Take a calcium or vitamin D supplement to lower your risk of fractures.  Be given hormone replacement therapy (HRT) to treat symptoms of menopause. Follow these  instructions at home: Lifestyle  Do not use any products that contain nicotine or tobacco, such as cigarettes, e-cigarettes, and chewing tobacco. If you need help quitting, ask your health care provider.  Do not use street drugs.  Do not share needles.  Ask your health care provider for help if you need support or information about quitting drugs. Alcohol use  Do not drink alcohol if: ? Your health care provider tells you not to drink. ? You are pregnant, may be pregnant, or are planning to become pregnant.  If you drink alcohol: ? Limit how much you use to 0-1 drink a day. ? Limit intake if you are breastfeeding.  Be aware of how much alcohol is in your drink. In the [place] one drink equals one 12 oz bottle of beer (355 mL), one 5 oz glass of wine (148 mL), or one 1 oz glass of hard liquor (44 mL). General instructions  Schedule regular health, dental, and eye exams.  Stay current with your vaccines.  Tell your health care provider if: ? You often feel depressed. ? You have ever been abused or do not feel safe at home. Summary  Adopting a healthy lifestyle and getting preventive care are  important in promoting health and wellness.  Follow your health care provider's instructions about healthy diet, exercising, and getting tested or screened for diseases.  Follow your health care provider's instructions on monitoring your cholesterol and blood pressure. This information is not intended to replace advice given to you by your health care provider. Make sure you discuss any questions you have with your health care provider. Document Released: 07/16/2010 Document Revised: 12/24/2017 Document Reviewed: 12/24/2017 Elsevier Patient Education  2020 Maceo Dee Maday M.D.

## 2018-11-30 NOTE — Patient Instructions (Signed)
Will notify you  of labs when available.   Continue lifestyle intervention healthy eating and exercise .    Health Maintenance, Female Adopting a healthy lifestyle and getting preventive care are important in promoting health and wellness. Ask your health care provider about:  The right schedule for you to have regular tests and exams.  Things you can do on your own to prevent diseases and keep yourself healthy. What should I know about diet, weight, and exercise? Eat a healthy diet   Eat a diet that includes plenty of vegetables, fruits, low-fat dairy products, and lean protein.  Do not eat a lot of foods that are high in solid fats, added sugars, or sodium. Maintain a healthy weight Body mass index (BMI) is used to identify weight problems. It estimates body fat based on height and weight. Your health care provider can help determine your BMI and help you achieve or maintain a healthy weight. Get regular exercise Get regular exercise. This is one of the most important things you can do for your health. Most adults should:  Exercise for at least 150 minutes each week. The exercise should increase your heart rate and make you sweat (moderate-intensity exercise).  Do strengthening exercises at least twice a week. This is in addition to the moderate-intensity exercise.  Spend less time sitting. Even light physical activity can be beneficial. Watch cholesterol and blood lipids Have your blood tested for lipids and cholesterol at 64 years of age, then have this test every 5 years. Have your cholesterol levels checked more often if:  Your lipid or cholesterol levels are high.  You are older than 64 years of age.  You are at high risk for heart disease. What should I know about cancer screening? Depending on your health history and family history, you may need to have cancer screening at various ages. This may include screening for:  Breast cancer.  Cervical cancer.   Colorectal cancer.  Skin cancer.  Lung cancer. What should I know about heart disease, diabetes, and high blood pressure? Blood pressure and heart disease  High blood pressure causes heart disease and increases the risk of stroke. This is more likely to develop in people who have high blood pressure readings, are of African descent, or are overweight.  Have your blood pressure checked: ? Every 3-5 years if you are 5-64 years of age. ? Every year if you are 86 years old or older. Diabetes Have regular diabetes screenings. This checks your fasting blood sugar level. Have the screening done:  Once every three years after age 32 if you are at a normal weight and have a low risk for diabetes.  More often and at a younger age if you are overweight or have a high risk for diabetes. What should I know about preventing infection? Hepatitis B If you have a higher risk for hepatitis B, you should be screened for this virus. Talk with your health care provider to find out if you are at risk for hepatitis B infection. Hepatitis C Testing is recommended for:  Everyone born from [birth year] through 1966.  Anyone with known risk factors for hepatitis C. Sexually transmitted infections (STIs)  Get screened for STIs, including gonorrhea and chlamydia, if: ? You are sexually active and are younger than 64 years of age. ? You are older than 64 years of age and your health care provider tells you that you are at risk for this type of infection. ? Your sexual activity has changed  since you were last screened, and you are at increased risk for chlamydia or gonorrhea. Ask your health care provider if you are at risk.  Ask your health care provider about whether you are at high risk for HIV. Your health care provider may recommend a prescription medicine to help prevent HIV infection. If you choose to take medicine to prevent HIV, you should first get tested for HIV. You should then be tested every 3 months for  as long as you are taking the medicine. Pregnancy  If you are about to stop having your period (premenopausal) and you may become pregnant, seek counseling before you get pregnant.  Take 400 to 800 micrograms (mcg) of folic acid every day if you become pregnant.  Ask for birth control (contraception) if you want to prevent pregnancy. Osteoporosis and menopause Osteoporosis is a disease in which the bones lose minerals and strength with aging. This can result in bone fractures. If you are 77 years old or older, or if you are at risk for osteoporosis and fractures, ask your health care provider if you should:  Be screened for bone loss.  Take a calcium or vitamin D supplement to lower your risk of fractures.  Be given hormone replacement therapy (HRT) to treat symptoms of menopause. Follow these instructions at home: Lifestyle  Do not use any products that contain nicotine or tobacco, such as cigarettes, e-cigarettes, and chewing tobacco. If you need help quitting, ask your health care provider.  Do not use street drugs.  Do not share needles.  Ask your health care provider for help if you need support or information about quitting drugs. Alcohol use  Do not drink alcohol if: ? Your health care provider tells you not to drink. ? You are pregnant, may be pregnant, or are planning to become pregnant.  If you drink alcohol: ? Limit how much you use to 0-1 drink a day. ? Limit intake if you are breastfeeding.  Be aware of how much alcohol is in your drink. In the [place] one drink equals one 12 oz bottle of beer (355 mL), one 5 oz glass of wine (148 mL), or one 1 oz glass of hard liquor (44 mL). General instructions  Schedule regular health, dental, and eye exams.  Stay current with your vaccines.  Tell your health care provider if: ? You often feel depressed. ? You have ever been abused or do not feel safe at home. Summary  Adopting a healthy lifestyle and getting preventive  care are important in promoting health and wellness.  Follow your health care provider's instructions about healthy diet, exercising, and getting tested or screened for diseases.  Follow your health care provider's instructions on monitoring your cholesterol and blood pressure. This information is not intended to replace advice given to you by your health care provider. Make sure you discuss any questions you have with your health care provider. Document Released: 07/16/2010 Document Revised: 12/24/2017 Document Reviewed: 12/24/2017 Elsevier Patient Education  2020 [employer].

## 2018-12-01 LAB — HEPATIC FUNCTION PANEL
ALT: 21 U/L (ref 0–35)
AST: 16 U/L (ref 0–37)
Albumin: 4.5 g/dL (ref 3.5–5.2)
Alkaline Phosphatase: 62 U/L (ref 39–117)
Bilirubin, Direct: 0.1 mg/dL (ref 0.0–0.3)
Total Bilirubin: 0.5 mg/dL (ref 0.2–1.2)
Total Protein: 7.5 g/dL (ref 6.0–8.3)

## 2018-12-01 LAB — LIPID PANEL
Cholesterol: 225 mg/dL — ABNORMAL HIGH (ref 0–200)
HDL: 74 mg/dL (ref >39.00)
LDL Cholesterol: 136 mg/dL — ABNORMAL HIGH (ref 0–99)
NonHDL: 150.67
Total CHOL/HDL Ratio: 3
Triglycerides: 72 mg/dL (ref 0.0–149.0)
VLDL: 14.4 mg/dL (ref 0.0–40.0)

## 2018-12-01 LAB — BASIC METABOLIC PANEL
BUN: 18 mg/dL (ref 6–23)
CO2: 28 mEq/L (ref 19–32)
Calcium: 9.6 mg/dL (ref 8.4–10.5)
Chloride: 102 mEq/L (ref 96–112)
Creatinine, Ser: 0.7 mg/dL (ref 0.40–1.20)
GFR: 101.78 mL/min (ref >60.00)
Glucose, Bld: 102 mg/dL — ABNORMAL HIGH (ref 70–99)
Potassium: 3.9 mEq/L (ref 3.5–5.1)
Sodium: 139 mEq/L (ref 135–145)

## 2018-12-01 LAB — CBC WITH DIFFERENTIAL/PLATELET
Basophils Absolute: 0 10*3/uL (ref 0.0–0.1)
Basophils Relative: 0.8 % (ref 0.0–3.0)
Eosinophils Absolute: 0 10*3/uL (ref 0.0–0.7)
Eosinophils Relative: 0.9 % (ref 0.0–5.0)
HCT: 41.1 % (ref 36.0–46.0)
Hemoglobin: 14.1 g/dL (ref 12.0–15.0)
Lymphocytes Relative: 52.4 % — ABNORMAL HIGH (ref 12.0–46.0)
Lymphs Abs: 2.1 10*3/uL (ref 0.7–4.0)
MCHC: 34.3 g/dL (ref 30.0–36.0)
MCV: 91.6 fl (ref 78.0–100.0)
Monocytes Absolute: 0.4 10*3/uL (ref 0.1–1.0)
Monocytes Relative: 10.2 % (ref 3.0–12.0)
Neutro Abs: 1.4 10*3/uL (ref 1.4–7.7)
Neutrophils Relative %: 35.7 % — ABNORMAL LOW (ref 43.0–77.0)
Platelets: 225 10*3/uL (ref 150.0–400.0)
RBC: 4.49 Mil/uL (ref 3.87–5.11)
RDW: 13.1 % (ref 11.5–15.5)
WBC: 4 10*3/uL (ref 4.0–10.5)

## 2018-12-01 LAB — VITAMIN B12: Vitamin B-12: 684 pg/mL (ref 211–911)

## 2018-12-01 LAB — TSH: TSH: 0.93 u[IU]/mL (ref 0.35–4.50)

## 2018-12-01 LAB — HEPATITIS C ANTIBODY
Hepatitis C Ab: NONREACTIVE
SIGNAL TO CUT-OFF: 0.01 (ref <1.00)

## 2018-12-01 LAB — HIV ANTIBODY (ROUTINE TESTING W REFLEX): HIV 1&2 Ab, 4th Generation: NONREACTIVE

## 2018-12-09 ENCOUNTER — Encounter: Payer: 59 | Admitting: Internal Medicine

## 2019-01-25 ENCOUNTER — Other Ambulatory Visit: Payer: Self-pay | Admitting: *Deleted

## 2019-01-25 MED ORDER — LOSARTAN POTASSIUM-HCTZ 100-12.5 MG PO TABS: 1.0000 | ORAL_TABLET | Freq: Every day | ORAL | 3 refills | Status: DC

## 2019-02-17 ENCOUNTER — Other Ambulatory Visit: Payer: Self-pay | Admitting: Internal Medicine

## 2019-02-17 DIAGNOSIS — Z1231 Encounter for screening mammogram for malignant neoplasm of breast: Secondary | ICD-10-CM

## 2019-02-22 ENCOUNTER — Encounter (HOSPITAL_BASED_OUTPATIENT_CLINIC_OR_DEPARTMENT_OTHER): Payer: Self-pay

## 2019-02-22 ENCOUNTER — Emergency Department (HOSPITAL_BASED_OUTPATIENT_CLINIC_OR_DEPARTMENT_OTHER)
Admission: EM | Admit: 2019-02-22 | Discharge: 2019-02-22 | Disposition: A | Discharge status: Home / Self Care | Payer: 59 | Attending: Emergency Medicine | Admitting: Emergency Medicine

## 2019-02-22 ENCOUNTER — Other Ambulatory Visit: Payer: Self-pay

## 2019-02-22 ENCOUNTER — Emergency Department (HOSPITAL_BASED_OUTPATIENT_CLINIC_OR_DEPARTMENT_OTHER): Payer: 59

## 2019-02-22 DIAGNOSIS — R0789 Other chest pain: Secondary | ICD-10-CM | POA: Insufficient documentation

## 2019-02-22 DIAGNOSIS — I10 Essential (primary) hypertension: Secondary | ICD-10-CM | POA: Insufficient documentation

## 2019-02-22 DIAGNOSIS — Z79899 Other long term (current) drug therapy: Secondary | ICD-10-CM | POA: Diagnosis not present

## 2019-02-22 HISTORY — DX: Essential (primary) hypertension: I10

## 2019-02-22 LAB — COMPREHENSIVE METABOLIC PANEL
ALT: 24 U/L (ref 0–44)
AST: 21 U/L (ref 15–41)
Albumin: 4 g/dL (ref 3.5–5.0)
Alkaline Phosphatase: 57 U/L (ref 38–126)
Anion gap: 8 (ref 5–15)
BUN: 14 mg/dL (ref 8–23)
CO2: 29 mmol/L (ref 22–32)
Calcium: 9.3 mg/dL (ref 8.9–10.3)
Chloride: 102 mmol/L (ref 98–111)
Creatinine, Ser: 0.82 mg/dL (ref 0.44–1.00)
GFR calc Af Amer: >60 mL/min (ref >60)
GFR calc non Af Amer: >60 mL/min (ref >60)
Glucose, Bld: 113 mg/dL — ABNORMAL HIGH (ref 70–99)
Potassium: 3.7 mmol/L (ref 3.5–5.1)
Sodium: 139 mmol/L (ref 135–145)
Total Bilirubin: 0.9 mg/dL (ref 0.3–1.2)
Total Protein: 7.5 g/dL (ref 6.5–8.1)

## 2019-02-22 LAB — CBC
HCT: 41.9 % (ref 36.0–46.0)
Hemoglobin: 14.3 g/dL (ref 12.0–15.0)
MCH: 30.8 pg (ref 26.0–34.0)
MCHC: 34.1 g/dL (ref 30.0–36.0)
MCV: 90.3 fL (ref 80.0–100.0)
Platelets: 255 10*3/uL (ref 150–400)
RBC: 4.64 MIL/uL (ref 3.87–5.11)
RDW: 12.2 % (ref 11.5–15.5)
WBC: 3.2 10*3/uL — ABNORMAL LOW (ref 4.0–10.5)
nRBC: 0 % (ref 0.0–0.2)

## 2019-02-22 LAB — TROPONIN I (HIGH SENSITIVITY)
Troponin I (High Sensitivity): <2 ng/L (ref <18)
Troponin I (High Sensitivity): <2 ng/L (ref <18)

## 2019-02-22 NOTE — ED Notes (Signed)
C/o pain starting below breast radiating mid sternal up to throat onset while working at desk this am

## 2019-02-22 NOTE — ED Triage Notes (Signed)
Pt c/o a chest discomfort that started today while sitting at her computer. Pt reports that the pain started near her breast and went up into her throat, explained it as a discomfort. Reports that she was also lightheaded with this event and still feels lethargic from it.

## 2019-02-22 NOTE — ED Provider Notes (Signed)
[facility] EMERGENCY DEPARTMENT Provider Note   CSN: [id number] Arrival date & time: 02/22/19  0755     History Chief Complaint  Patient presents with  . Chest Pain    Stacey Young is a 64 y.o. female.  64 year old female with past medical history below who presents with chest pain.  Patient states that this morning she was sitting working at her desk when she had a sudden onset of central, nonradiating chest discomfort that she describes as a "flushing" type of pain that went up her chest into her throat.  No associated nausea, vomiting, diaphoresis, or breathing problems.  Symptoms persisted which is what prompted her to come in.  She denies any back pain, radiation down her arm, or any history of similar pain.  She denies any cough or recent illness.  No personal or family history of heart disease.  No recent travel.  The history is provided by the patient.  Chest Pain      Past Medical History:  Diagnosis Date  . Anemia   . Back pain   . Fibroids    uterine  . Hay fever   . Hx of colonoscopy    utd  . Hx of transfusion    w gall baldder surgery  donates blood ( thus eg screen)  . Hx of varicella    as a child  . Hypertension   . IBS (irritable bowel syndrome)   . WBC decreased     Patient Active Problem List   Diagnosis Date Noted  . Acute medial meniscal tear 09/11/2015  . Patellofemoral syndrome of both knees 09/11/2015  . Knee MCL sprain 09/11/2015  . Essential hypertension 11/19/2013  . Occasional numbness/prickling/tingling right mid toes 12/09/2012  . Family history of diabetes mellitus 12/09/2012  . Anterior knee pain 12/03/2010  . Sleep disturbances 12/03/2010  . Visit for preventive health examination 12/03/2010  . Foot pain, left 09/22/2010  . [place], MILD 11/13/2009  . BACK PAIN 07/24/2009  . HYPERGLYCEMIA 07/24/2009    Past Surgical History:  Procedure Laterality Date  . BLADDER REPAIR W/ CESAREAN SECTION  1987  .  [place]  . VESICOVAGINAL FISTULA CLOSURE W/ TAH  2003   for fibroids and endometriosis     OB History   No obstetric history on file.     Family History  Problem Relation Age of Onset  . Arthritis Mother   . Obstructive Sleep Apnea Mother        on CPAP  . Cancer Father        bladder  . Hyperlipidemia Father   . Migraines Sister   . Hyperlipidemia Sister        low wbc  . Dementia Other        older aunt uncle gm 17s and 15s    Social History   Tobacco Use  . Smoking status: Never Smoker  . Smokeless tobacco: Never Used  Substance Use Topics  . Alcohol use: Yes    Alcohol/week: 14.0 standard drinks    Types: 14 Glasses of wine per week    Comment: "2 per night"   . Drug use: No    Home Medications Prior to Admission medications   Medication Sig Start Date End Date Taking? Authorizing Provider  losartan-hydrochlorothiazide (HYZAAR) 100-12.5 MG tablet Take 1 tablet by mouth daily. 01/25/19   Panosh, Standley Brooking, MD  vitamin B-12 (CYANOCOBALAMIN) 100 MCG tablet Take 100 mcg by mouth daily.  [provider]  XIIDRA 5 % SOLN INSTILL 1 DROP INTO BOTH EYES BID 12/22/17   [provider]    Allergies    Patient has no known allergies.  Review of Systems   Review of Systems  Cardiovascular: Positive for chest pain.   All other systems reviewed and are negative except that which was mentioned in HPI  Physical Exam Updated Vital Signs BP (!) 144/95   Pulse (!) 59   Temp 98 F (36.7 C) (Oral)   Resp 18   Ht 5\' 4"  (1.626 m)   Wt 68.9 kg   SpO2 100% Comment: rm air  BMI 26.09 kg/m   Physical Exam Vitals and nursing note reviewed.  Constitutional:      General: She is not in acute distress.    Appearance: She is well-developed.  HENT:     Head: Normocephalic and atraumatic.  Eyes:     Conjunctiva/sclera: Conjunctivae normal.  Cardiovascular:     Rate and Rhythm: Normal rate and regular rhythm.     Heart sounds: Normal  heart sounds. No murmur.  Pulmonary:     Effort: Pulmonary effort is normal.     Breath sounds: Normal breath sounds.  Abdominal:     General: There is no distension.     Palpations: Abdomen is soft.     Tenderness: There is no abdominal tenderness.  Musculoskeletal:     Cervical back: Neck supple.     Right lower leg: No edema.     Left lower leg: No edema.  Skin:    General: Skin is warm and dry.  Neurological:     Mental Status: She is alert and oriented to person, place, and time.     Comments: Fluent speech  Psychiatric:        Judgment: Judgment normal.     ED Results / Procedures / Treatments   Labs (all labs ordered are listed, but only abnormal results are displayed) Labs Reviewed  COMPREHENSIVE METABOLIC PANEL - Abnormal; Notable for the following components:      Result Value   Glucose, Bld 113 (*)    All other components within normal limits  CBC - Abnormal; Notable for the following components:   WBC 3.2 (*)    All other components within normal limits  TROPONIN I (HIGH SENSITIVITY)  TROPONIN I (HIGH SENSITIVITY)    EKG EKG Interpretation  Date/Time:  Monday February 22 2019 08:08:31 EST Ventricular Rate:  61 PR Interval:    QRS Duration: 82 QT Interval:  408 QTC Calculation: 411 R Axis:   66 Text Interpretation: Sinus rhythm Abnormal R-wave progression, early transition Baseline wander in lead(s) II III aVF V2 V5 No previous ECGs available Confirmed by Theotis Burrow [phone]) on 02/22/2019 8:12:22 AM   Radiology DG Chest 2 View  Result Date: 02/22/2019 CLINICAL DATA:  Chest discomfort EXAM: CHEST - 2 VIEW COMPARISON:  None. FINDINGS: Lungs are clear. Heart is upper normal in size with pulmonary vascularity normal. No adenopathy. There is midthoracic dextroscoliosis with upper lumbar levoscoliosis. No pneumothorax. IMPRESSION: Lungs clear.  Heart upper normal in size.  No evident adenopathy. Electronically Signed   By: Lowella Grip III M.D.   On:  02/22/2019 08:44    Procedures Procedures (including critical care time)  Medications Ordered in ED Medications - No data to display  ED Course  I have reviewed the triage vital signs and the nursing notes.  Pertinent labs & imaging results that were available during my  care of the patient were reviewed by me and considered in my medical decision making (see chart for details).    MDM Rules/Calculators/A&P                       Well-appearing on exam.  EKG shows sinus rhythm with no ischemic changes.  No risk factors for PE and normal vital signs; symptoms sound extremely atypical for PE therefore do not feel she requires testing. No description or severe symptoms and no widened mediastinum on CXR to suggest aortic dissection.  Lab work shows normal CMP and CBC, negative serial troponins.  HEART score is </=3 and given atypical symptoms w/ reassuring w/u here, I feel she is appropriate for outpatient f/u with PCP . I have extensively reviewed return precautions. Final Clinical Impression(s) / ED Diagnoses Final diagnoses:  Atypical chest pain    Rx / DC Orders ED Discharge Orders    None       Anwen Cannedy, Wenda Overland, MD 02/22/19 1127

## 2019-02-25 ENCOUNTER — Encounter: Payer: Self-pay | Admitting: Internal Medicine

## 2019-02-25 ENCOUNTER — Other Ambulatory Visit: Payer: Self-pay

## 2019-02-25 ENCOUNTER — Telehealth (INDEPENDENT_AMBULATORY_CARE_PROVIDER_SITE_OTHER): Payer: 59 | Admitting: Internal Medicine

## 2019-02-25 DIAGNOSIS — I10 Essential (primary) hypertension: Secondary | ICD-10-CM | POA: Diagnosis not present

## 2019-02-25 DIAGNOSIS — G9389 Other specified disorders of brain: Secondary | ICD-10-CM | POA: Diagnosis not present

## 2019-02-25 DIAGNOSIS — R0789 Other chest pain: Secondary | ICD-10-CM

## 2019-02-25 DIAGNOSIS — Z79899 Other long term (current) drug therapy: Secondary | ICD-10-CM

## 2019-02-25 NOTE — Progress Notes (Signed)
Virtual Visit via Video Note  I connected with@ on 02/25/19 at  8:30 AM EST by a video enabled telemedicine application and verified that I am speaking with the correct person using two identifiers. Location patient: home Location provider:home office Persons participating in the virtual visit: patient, provider  [place] national recommendations  regarding COVID 19 pandemic   video visit is advised over in office visit for this patient.  Patient aware  of the limitations of evaluation and management by telemedicine and  availability of in person appointments. and agreed to proceed.   HPI: Stacey Young presents for video visit  FU ed visit  For  Atypical chest  Pain tightness   That was felt to not be a high risk condition.  Had chest tightness beginning lower and "rolling up to throat area "  No assoc sx but concern  To make sure not serious  And seen in ed  evaluation with ekg observation troponin x 2  Noted low risk situation . 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  audiol memory  is .     Sleeping  7+ hours   Can walk 8-9 miles and no limitations  No other  Assoc sx  No HB  Swallowing difficulty  bp has been ok as far not recent checking  ROS: See pertinent positives and negatives per HPI.  Past Medical History:  Diagnosis Date  . Anemia   . Back pain   . Fibroids    uterine  . Hay fever   . Hx of colonoscopy    utd  . Hx of transfusion    w gall baldder surgery  donates blood ( thus eg screen)  . Hx of varicella    as a child  . Hypertension   . IBS (irritable bowel syndrome)   . WBC decreased     Past Surgical History:  Procedure Laterality Date  . BLADDER REPAIR W/ CESAREAN SECTION  1987  . [place]  . VESICOVAGINAL FISTULA CLOSURE W/ TAH  2003   for fibroids  and endometriosis    Family History  Problem Relation Age of Onset  . Arthritis Mother   . Obstructive Sleep Apnea Mother        on CPAP  . Cancer Father        bladder  . Hyperlipidemia Father   . Migraines Sister   . Hyperlipidemia Sister        low wbc  . Dementia Other        older aunt uncle gm 36s and 4s    Social History   Tobacco Use  . Smoking status: Never Smoker  . Smokeless tobacco: Never Used  Substance Use Topics  . Alcohol use: Yes    Alcohol/week: 14.0 standard drinks    Types: 14 Glasses of wine per week    Comment: "2 per night"   . Drug use: No      Current Outpatient Medications:  .  losartan-hydrochlorothiazide (HYZAAR) 100-12.5 MG tablet, Take 1 tablet by mouth daily., Disp: 90 tablet, Rfl: 3 .  vitamin B-12 (CYANOCOBALAMIN) 100 MCG tablet, Take 100 mcg by mouth daily., Disp: , Rfl:  .  XIIDRA 5 % SOLN, INSTILL 1 DROP INTO BOTH EYES BID, Disp: ,  Rfl:   EXAM: BP Readings from Last 3 Encounters:  02/22/19 138/74  11/30/18 120/78  01/29/18 138/82    VITALS per patient if applicable:  GENERAL: alert, oriented, appears well and in no acute distress  HEENT: atraumatic, conjunttiva clear, no obvious abnormalities on inspection of external nose and ears  NECK: normal movements of the head and neck  LUNGS: on inspection no signs of respiratory distress, breathing rate appears normal, no obvious gross SOB, gasping or wheezing  CV: no obvious cyanosis  MS: moves all visible extremities without noticeable abnormality  PSYCH/NEURO: pleasant and cooperative, no obvious depression or anxiety, speech and thought processing grossly intact Lab Results  Component Value Date   WBC 3.2 (L) 02/22/2019   HGB 14.3 02/22/2019   HCT 41.9 02/22/2019   PLT 255 02/22/2019   GLUCOSE 113 (H) 02/22/2019   CHOL 225 (H) 11/30/2018   TRIG 72.0 11/30/2018   HDL 74.00 11/30/2018   LDLCALC 136 (H) 11/30/2018   ALT 24 02/22/2019   AST 21 02/22/2019   NA 139  02/22/2019   K 3.7 02/22/2019   CL 102 02/22/2019   CREATININE 0.82 02/22/2019   BUN 14 02/22/2019   CO2 29 02/22/2019   TSH 0.93 11/30/2018   HGBA1C 5.7 11/14/2017  reviewed ed visit labs and x ray etc  And psychoneuro report from [place]:  Discussed the following assessment and plan:    ICD-10-CM   1. Atypical chest pain  R07.89   2. Essential hypertension  I10   3. Medication management  Z79.899   4. 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  Cuuse  stress poss esophageal  If continue sx or change in exercise tolerance other sx then reevaluate  Confirm that bp is controlled   . Consider rx lipids if needed .  Send in bp readings ofr bid 3-5 days  Fu if any recurring sx of concern.  Counseled.   Expectant management and discussion of plan and treatment with opportunity to ask questions and all were answered. The patient agreed with the plan and demonstrated an understanding of the instructions.   Advised to call back or seek an in-person evaluation if worsening  or having  further concerns . Return for bp readings  to confirm  control.  Shanon Ace, MD

## 2019-03-19 MED ORDER — LOSARTAN POTASSIUM-HCTZ 100-25 MG PO TABS: 1.0000 | ORAL_TABLET | Freq: Every day | ORAL | 2 refills | Status: DC

## 2019-03-19 NOTE — Telephone Encounter (Signed)
So would be better to have below 140/90    If you agree lets change the BP medication to  Losartan  hctz 100/25 (instead of 100/12.5  [facility] please send in 90 days x 2 if pt agrees )   send in bp readings   After 3-4 weeks  And/ Or virtual visit to discuss if needed at that time

## 2019-03-26 ENCOUNTER — Ambulatory Visit
Admission: RE | Admit: 2019-03-26 | Discharge: 2019-03-26 | Disposition: A | Discharge status: Home / Self Care | Payer: 59 | Source: Ambulatory Visit | Attending: Internal Medicine | Admitting: Internal Medicine

## 2019-03-26 ENCOUNTER — Other Ambulatory Visit: Payer: Self-pay

## 2019-03-26 DIAGNOSIS — Z1231 Encounter for screening mammogram for malignant neoplasm of breast: Secondary | ICD-10-CM

## 2019-04-26 LAB — CBC AND DIFFERENTIAL
HCT: 40 (ref 36–46)
Hemoglobin: 13.4 (ref 12.0–16.0)
Neutrophils Absolute: 1
Platelets: 238 (ref 150–399)
WBC: 4.2

## 2019-04-26 LAB — VITAMIN B12: Vitamin B-12: 805

## 2019-04-26 LAB — TSH: TSH: 1.1 (ref <=5.90)

## 2019-04-26 LAB — POCT INR: INR: 1 (ref <=1.1)

## 2019-06-21 ENCOUNTER — Other Ambulatory Visit: Payer: Self-pay

## 2019-06-21 MED ORDER — LOSARTAN POTASSIUM-HCTZ 100-25 MG PO TABS: 1.0000 | ORAL_TABLET | Freq: Every day | ORAL | 1 refills | Status: DC

## 2019-07-30 ENCOUNTER — Telehealth: Payer: Self-pay | Admitting: Internal Medicine

## 2019-07-30 NOTE — Telephone Encounter (Signed)
Pt is calling in stating that she will be having a new grand baby in a few weeks and need the recommended vaccines so that she can be around the new baby.  Is pt eligible for a tdap?  You can leave a detail msg on the voicemail for pt if there no one answers to get scheduled.

## 2019-07-30 NOTE — Telephone Encounter (Signed)
Called patient and LMOVM to return call  Called and left a detailed voice message for patient to call us back and schedule a nurse visit to receive her Tdap vaccination.

## 2019-07-30 NOTE — Telephone Encounter (Signed)
Looks like patient is due for Tdap.  Please advise if anything else is needed. I will have patient schedule a nurse visit for injection.

## 2019-07-30 NOTE — Telephone Encounter (Signed)
Sorry DTaP vaccine

## 2019-07-30 NOTE — Telephone Encounter (Signed)
Please arrange for tdap  For patient

## 2019-08-03 ENCOUNTER — Ambulatory Visit (INDEPENDENT_AMBULATORY_CARE_PROVIDER_SITE_OTHER): Payer: 59

## 2019-08-03 ENCOUNTER — Other Ambulatory Visit: Payer: Self-pay

## 2019-08-03 DIAGNOSIS — Z23 Encounter for immunization: Secondary | ICD-10-CM | POA: Diagnosis not present

## 2019-08-03 NOTE — Telephone Encounter (Signed)
Pt has called and has been scheduled on the nurse visit

## 2019-08-03 NOTE — Progress Notes (Signed)
Per orders of Dr. Regis Bill, injection of TDAP vaccine given by Franco Collet. Patient tolerated injection well.

## 2019-10-30 IMAGING — MG DIGITAL SCREENING BILATERAL MAMMOGRAM WITH CAD
4 series · 4 of 4 positions shown · non-contrast
Comparison: Previous exam(s).

CLINICAL DATA: Screening.

EXAM:
DIGITAL SCREENING BILATERAL MAMMOGRAM WITH CAD

[L MLO]
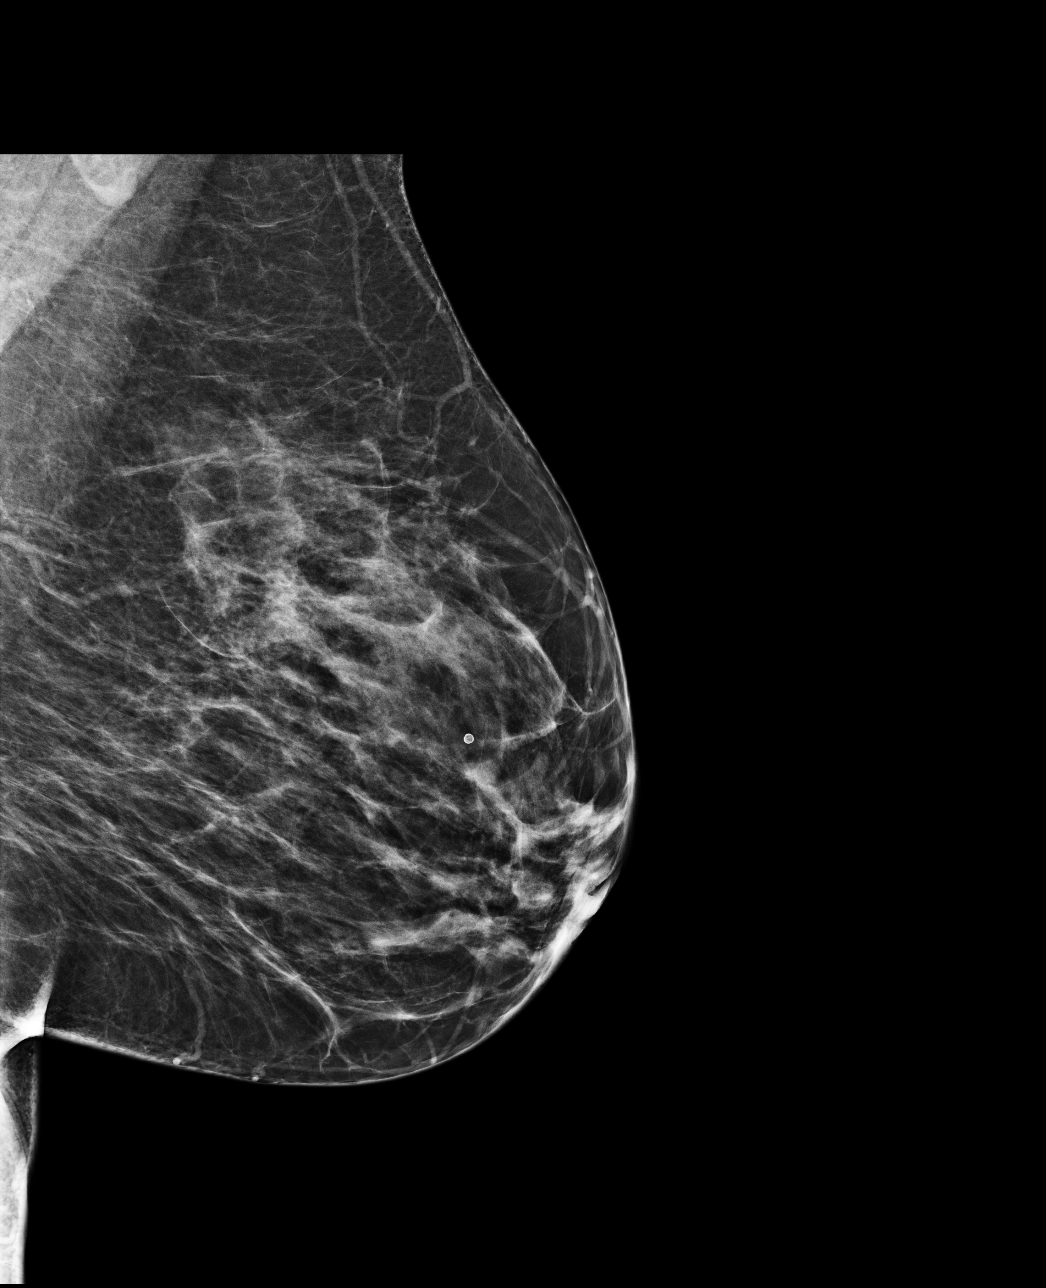

[R CC]
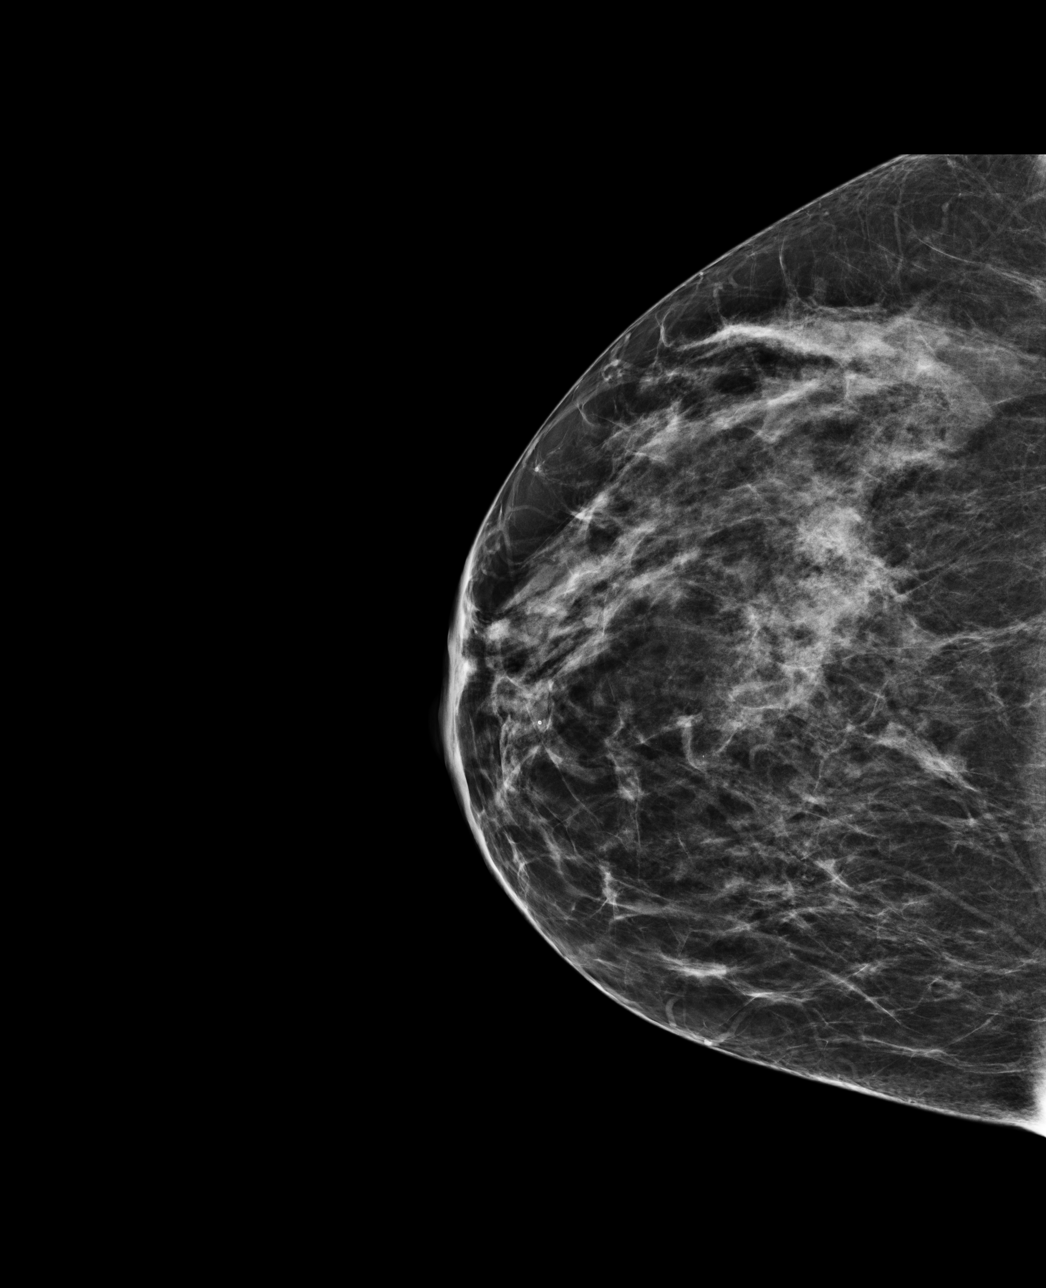

[R MLO]
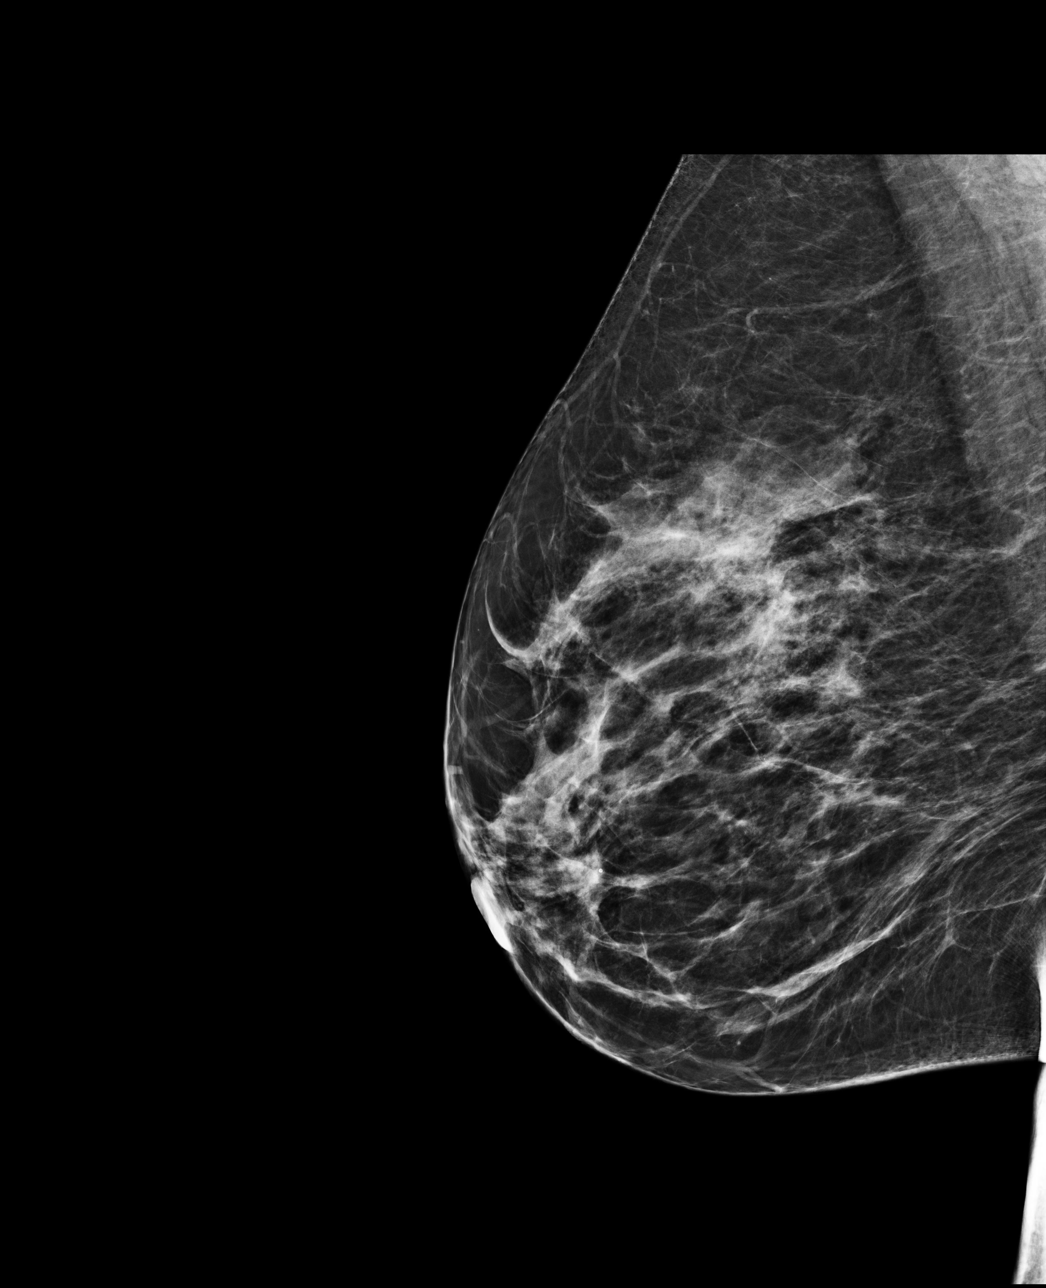

[L CC]
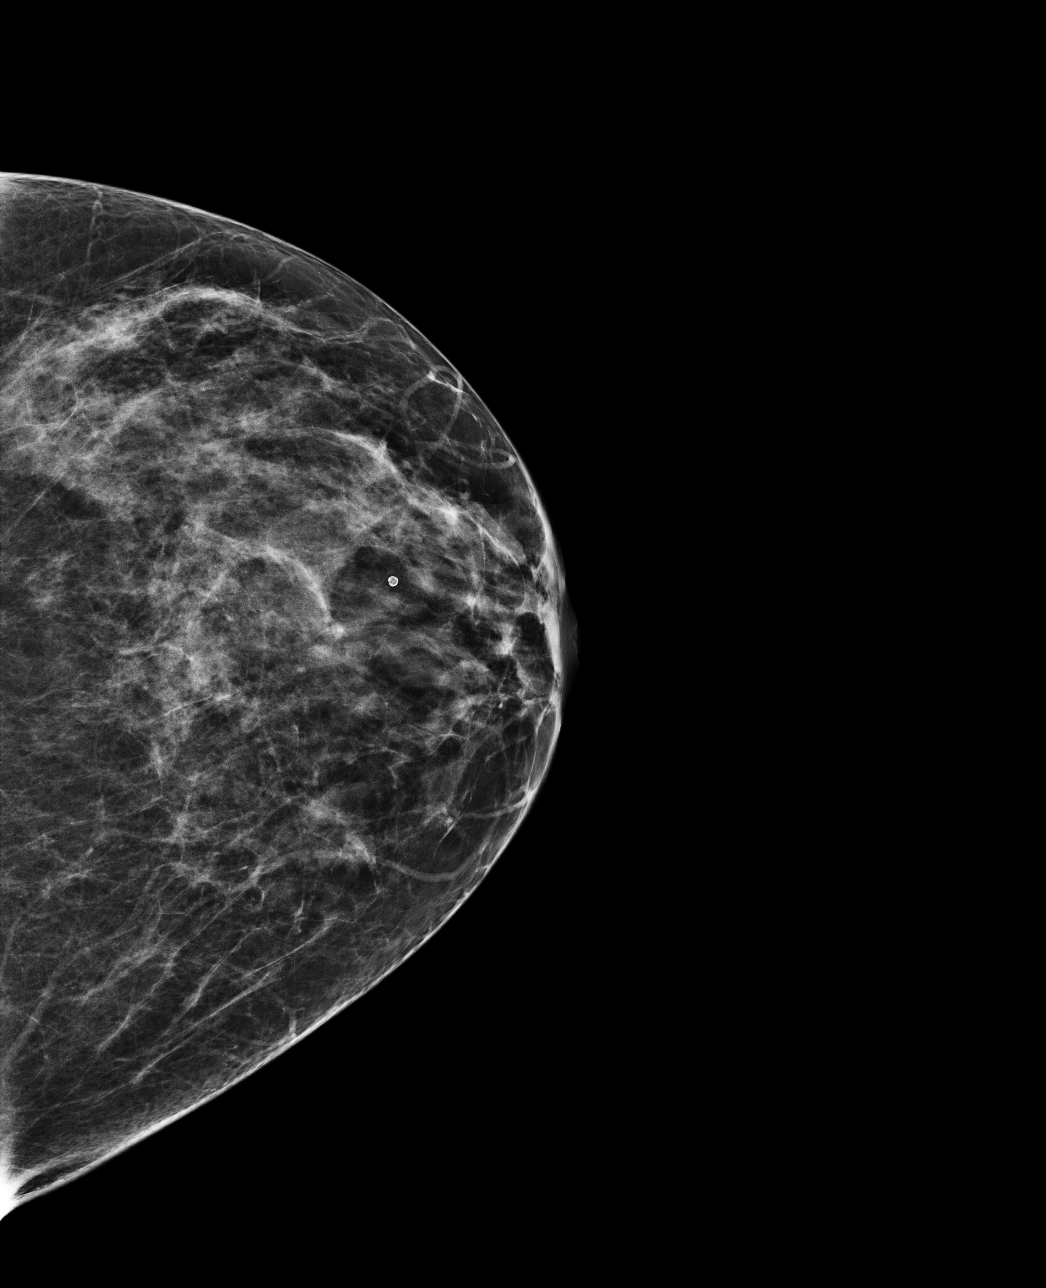

[4 of 4 positions shown; findings below may reference images not displayed]

ACR Breast Density Category c: The breast tissue is heterogeneously
dense, which may obscure small masses.
FINDINGS: There are no findings suspicious for malignancy. Images were
processed with CAD.
IMPRESSION: No mammographic evidence of malignancy. A result letter of this
screening mammogram will be mailed directly to the patient.

RECOMMENDATION:
Screening mammogram in one year. (Code:YJ-[id number])

BI-RADS CATEGORY  1: Negative.

## 2019-12-06 NOTE — Progress Notes (Signed)
Chief Complaint  Patient presents with  . Annual Exam    CPE, no concerns. Patient fasting.     HPI: Patient  Stacey Young  64 y.o. comes in today for Preventive Health Care visit  And medicine review. Blood pressure taking medicine not checking regularly but has been in range. 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  Considering trying to get into a medicine trial. No major changes in health is able to walk number of miles knee minimal problem.   Health Maintenance  Topic Date Due  . DEXA SCAN  Never done  . COLONOSCOPY  12/16/2019  . PNA vac Low Risk Adult (2 of 2 - PPSV23) 12/06/2020  . MAMMOGRAM  03/25/2021  . TETANUS/TDAP  08/02/2029  . INFLUENZA VACCINE  Completed  . COVID-19 Vaccine  Completed  . Hepatitis C Screening  Completed  . HIV Screening  Completed   Health Maintenance Review LIFESTYLE:  Exercise: Walks the dog and 4 to 5 miles Tobacco/ETS:n Alcohol: n Sugar beverages:n Sleep:ok  Drug use: no HH of 2 Work:retireing soon has a young grandchild   ROS:  GEN/ HEENT: No fever, significant weight changes sweats headaches vision problems hearing changes, CV/ PULM; No chest pain shortness of breath cough, syncope,edema  change in exercise tolerance. GI /GU: No adominal pain, vomiting, change in bowel habits. No blood in the stool. No significant GU symptoms. SKIN/HEME: ,no acute skin rashes suspicious lesions or bleeding. No lymphadenopathy, nodules, masses.  NEURO/ PSYCH:  No neurologic signs such as weakness numbness. No depression anxiety. IMM/ Allergy: No unusual infections.  Allergy .   REST of 12 system review negative except as per HPI   Past Medical History:  Diagnosis Date  . Anemia   . Back pain   . Fibroids    uterine  . Hay fever   . Hx of colonoscopy    utd  . Hx of transfusion    w gall baldder surgery  donates  blood ( thus eg screen)  . Hx of varicella    as a child  . Hypertension   . IBS (irritable bowel syndrome)   . WBC decreased     Past Surgical History:  Procedure Laterality Date  . BLADDER REPAIR W/ CESAREAN SECTION  1987  . [place]  . VESICOVAGINAL FISTULA CLOSURE W/ TAH  2003   for fibroids and endometriosis    Family History  Problem Relation Age of Onset  . Arthritis Mother   . Obstructive Sleep Apnea Mother        on CPAP  . Cancer Father        bladder  . Hyperlipidemia Father   . Migraines Sister   . Hyperlipidemia Sister        low wbc  . Dementia Other        older aunt uncle gm 5s and 4s    Social History   Socioeconomic History  . Marital status: Married    Spouse name: Not on file  . Number of children: Not on file  . Years of education: Not on file  . Highest education level: Not on file  Occupational History  . Not on file  Tobacco Use  . Smoking status: Never Smoker  . Smokeless tobacco: Never Used  Vaping Use  . Vaping Use: Never used  Substance  and Sexual Activity  . Alcohol use: Yes    Alcohol/week: 14.0 standard drinks    Types: 14 Glasses of wine per week    Comment: "2 per night"   . Drug use: No  . Sexual activity: Not on file  Other Topics Concern  . Not on file  Social History Narrative   Never Smoked   Alcohol use-yes about 2 per day red wine   Regular Exercise-yes   Drug use- no   hh of 2    Pet dog   Married education PhD   Education officer, museum a lot internationally 2 weeks out of each month about   orig from [place] Hx   Social Determinants of Health   Financial Resource Strain:   . Difficulty of Paying Living Expenses: Not on file  Food Insecurity:   . Worried About Charity fundraiser in the Last Year: Not on file  . Ran Out of Food in the Last Year: Not on file  Transportation Needs:   . Lack of Transportation (Medical): Not on file  . Lack of  Transportation (Non-Medical): Not on file  Physical Activity:   . Days of Exercise per Week: Not on file  . Minutes of Exercise per Session: Not on file  Stress:   . Feeling of Stress : Not on file  Social Connections:   . Frequency of Communication with Friends and Family: Not on file  . Frequency of Social Gatherings with Friends and Family: Not on file  . Attends Religious Services: Not on file  . Active Member of Clubs or Organizations: Not on file  . Attends Archivist Meetings: Not on file  . Marital Status: Not on file    Outpatient Medications Prior to Visit  Medication Sig Dispense Refill  . donepezil (ARICEPT) 10 MG tablet Take 10 mg by mouth daily.    Marland Kitchen losartan-hydrochlorothiazide (HYZAAR) 100-25 MG tablet Take 1 tablet by mouth daily. 90 tablet 1  . vitamin B-12 (CYANOCOBALAMIN) 100 MCG tablet Take 100 mcg by mouth daily.    Marland Kitchen XIIDRA 5 % SOLN INSTILL 1 DROP INTO BOTH EYES BID (Patient not taking: Reported on 12/07/2019)     No facility-administered medications prior to visit.     EXAM:  BP 118/68   Pulse 62   Temp 98.1 F (36.7 C) (Oral)   Ht 5\' 4"  (1.626 m)   Wt 151 lb 3.2 oz (68.6 kg)   SpO2 97%   BMI 25.95 kg/m   Body mass index is 25.95 kg/m. Wt Readings from Last 3 Encounters:  12/07/19 151 lb 3.2 oz (68.6 kg)  02/22/19 152 lb (68.9 kg)  11/30/18 152 lb 3.2 oz (69 kg)    Physical Exam: Vital signs reviewed QQI:WLNL is a well-developed well-nourished alert cooperative    who appearsr stated age in no acute distress.  HEENT: normocephalic atraumatic , Eyes: PERRL EOM's full, conjunctiva clear, Nares: paten,t no deformity discharge or tenderness., Ears: no deformity EAC's clear TMs with normal landmarks. Mouth: masked  NECK: supple without masses, thyromegaly or bruits. CHEST/PULM:  Clear to auscultation and percussion breath sounds equal no wheeze , rales or rhonchi. No chest wall deformities or tenderness. Breast: normal by inspection .  No dimpling, discharge, masses, tenderness or discharge . CV: PMI is nondisplaced, S1 S2 no gallops, murmurs, rubs. Peripheral pulses are full without delay.No JVD .  ABDOMEN: Bowel sounds normal nontender  No guard or rebound,  no hepato splenomegal no CVA tenderness.   Extremtities:  No clubbing cyanosis or edema, no acute joint swelling or redness no focal atrophy NEURO:  Oriented x3, cranial nerves 3-12 appear to be intact, no obvious focal weakness,gait within normal limits no abnormal reflexes or asymmetrical SKIN: No acute rashes normal turgor, color, no bruising or petechiae. PSYCH: Oriented, good eye contact, no obvious depression anxiety, cognition and judgment appear normal. Not formally tested  LN: no cervical axillary inguinal adenopathy  Lab Results  Component Value Date   WBC 4.2 04/26/2019   HGB 13.4 04/26/2019   HCT 40 04/26/2019   PLT 238 04/26/2019   GLUCOSE 113 (H) 02/22/2019   CHOL 225 (H) 11/30/2018   TRIG 72.0 11/30/2018   HDL 74.00 11/30/2018   LDLCALC 136 (H) 11/30/2018   ALT 24 02/22/2019   AST 21 02/22/2019   NA 139 02/22/2019   K 3.7 02/22/2019   CL 102 02/22/2019   CREATININE 0.82 02/22/2019   BUN 14 02/22/2019   CO2 29 02/22/2019   TSH 1.10 04/26/2019   INR 1.0 04/26/2019   HGBA1C 5.7 11/14/2017    BP Readings from Last 3 Encounters:  12/07/19 118/68  02/22/19 138/74  11/30/18 120/78    Lab planr esults reviewed with patient   ASSESSMENT AND PLAN:  Discussed the following assessment and plan:    ICD-10-CM   1. Encounter for preventative adult health care exam with abnormal findings  Z00.01 CBC with Differential/Platelet    BASIC METABOLIC PANEL WITH GFR    Lipid panel    Hepatic function panel    TSH    CBC with Differential/Platelet    BASIC METABOLIC PANEL WITH GFR    Lipid panel    Hepatic function panel    TSH  2. Medication management  Z79.899 CBC with Differential/Platelet    BASIC METABOLIC PANEL WITH GFR    Lipid panel     Hepatic function panel    TSH    CBC with Differential/Platelet    BASIC METABOLIC PANEL WITH GFR    Lipid panel    Hepatic function panel    TSH  3. Essential hypertension  I10 CBC with Differential/Platelet    BASIC METABOLIC PANEL WITH GFR    Lipid panel    Hepatic function panel    TSH    CBC with Differential/Platelet    BASIC METABOLIC PANEL WITH GFR    Lipid panel    Hepatic function panel    TSH  4. Abnormal brain function dec visual patterning function  G93.89 CBC with Differential/Platelet    BASIC METABOLIC PANEL WITH GFR    Lipid panel    Hepatic function panel    TSH    CBC with Differential/Platelet    BASIC METABOLIC PANEL WITH GFR    Lipid panel    Hepatic function panel    TSH  5. Estrogen deficiency  E28.39 DG Bone Density    CBC with Differential/Platelet    BASIC METABOLIC PANEL WITH GFR    Lipid panel    Hepatic function panel    TSH    CBC with Differential/Platelet    BASIC METABOLIC PANEL WITH GFR    Lipid panel    Hepatic function panel    TSH  6. Need for pneumococcal vaccination  Z23 Pneumococcal conjugate vaccine 13-valent IM  dexa  prevnar 13  bp control seems  At goal dns  Issues  Followed at [facility]   Keep  Updated  HCM  Return in about 1 year (around 12/06/2020) for preventive /cpx and medications, depending on results.  Patient Care Team: Burnis Medin, MD as PCP - General Patient Instructions  Continue lifestyle intervention healthy eating and exercise .  Lab  Today  Will notify you  of labs when available.   Contact  [facility] for update time for colonoscopy screening . Bp is at goal .  Will order dexa scan  For  [facility]  X ray   You can call for appt  Even into next year  .   Health Maintenance, Female Adopting a healthy lifestyle and getting preventive care are important in promoting health and wellness. Ask your health care provider about:  The right schedule for you to have regular tests and exams.  Things you can  do on your own to prevent diseases and keep yourself healthy. What should I know about diet, weight, and exercise? Eat a healthy diet   Eat a diet that includes plenty of vegetables, fruits, low-fat dairy products, and lean protein.  Do not eat a lot of foods that are high in solid fats, added sugars, or sodium. Maintain a healthy weight Body mass index (BMI) is used to identify weight problems. It estimates body fat based on height and weight. Your health care provider can help determine your BMI and help you achieve or maintain a healthy weight. Get regular exercise Get regular exercise. This is one of the most important things you can do for your health. Most adults should:  Exercise for at least 150 minutes each week. The exercise should increase your heart rate and make you sweat (moderate-intensity exercise).  Do strengthening exercises at least twice a week. This is in addition to the moderate-intensity exercise.  Spend less time sitting. Even light physical activity can be beneficial. Watch cholesterol and blood lipids Have your blood tested for lipids and cholesterol at 65 years of age, then have this test every 5 years. Have your cholesterol levels checked more often if:  Your lipid or cholesterol levels are high.  You are older than 65 years of age.  You are at high risk for heart disease. What should I know about cancer screening? Depending on your health history and family history, you may need to have cancer screening at various ages. This may include screening for:  Breast cancer.  Cervical cancer.  Colorectal cancer.  Skin cancer.  Lung cancer. What should I know about heart disease, diabetes, and high blood pressure? Blood pressure and heart disease  High blood pressure causes heart disease and increases the risk of stroke. This is more likely to develop in people who have high blood pressure readings, are of African descent, or are overweight.  Have your  blood pressure checked: ? Every 3-5 years if you are 24-57 years of age. ? Every year if you are 20 years old or older. Diabetes Have regular diabetes screenings. This checks your fasting blood sugar level. Have the screening done:  Once every three years after age 28 if you are at a normal weight and have a low risk for diabetes.  More often and at a younger age if you are overweight or have a high risk for diabetes. What should I know about preventing infection? Hepatitis B If you have a higher risk for hepatitis B, you should be screened for this virus. Talk with your health care provider to find out if you are at risk for hepatitis B infection. Hepatitis C Testing  is recommended for:  Everyone born from [birth year] through 1966.  Anyone with known risk factors for hepatitis C. Sexually transmitted infections (STIs)  Get screened for STIs, including gonorrhea and chlamydia, if: ? You are sexually active and are younger than 65 years of age. ? You are older than 65 years of age and your health care provider tells you that you are at risk for this type of infection. ? Your sexual activity has changed since you were last screened, and you are at increased risk for chlamydia or gonorrhea. Ask your health care provider if you are at risk.  Ask your health care provider about whether you are at high risk for HIV. Your health care provider may recommend a prescription medicine to help prevent HIV infection. If you choose to take medicine to prevent HIV, you should first get tested for HIV. You should then be tested every 3 months for as long as you are taking the medicine. Pregnancy  If you are about to stop having your period (premenopausal) and you may become pregnant, seek counseling before you get pregnant.  Take 400 to 800 micrograms (mcg) of folic acid every day if you become pregnant.  Ask for birth control (contraception) if you want to prevent pregnancy. Osteoporosis and  menopause Osteoporosis is a disease in which the bones lose minerals and strength with aging. This can result in bone fractures. If you are 29 years old or older, or if you are at risk for osteoporosis and fractures, ask your health care provider if you should:  Be screened for bone loss.  Take a calcium or vitamin D supplement to lower your risk of fractures.  Be given hormone replacement therapy (HRT) to treat symptoms of menopause. Follow these instructions at home: Lifestyle  Do not use any products that contain nicotine or tobacco, such as cigarettes, e-cigarettes, and chewing tobacco. If you need help quitting, ask your health care provider.  Do not use street drugs.  Do not share needles.  Ask your health care provider for help if you need support or information about quitting drugs. Alcohol use  Do not drink alcohol if: ? Your health care provider tells you not to drink. ? You are pregnant, may be pregnant, or are planning to become pregnant.  If you drink alcohol: ? Limit how much you use to 0-1 drink a day. ? Limit intake if you are breastfeeding.  Be aware of how much alcohol is in your drink. In the [place] one drink equals one 12 oz bottle of beer (355 mL), one 5 oz glass of wine (148 mL), or one 1 oz glass of hard liquor (44 mL). General instructions  Schedule regular health, dental, and eye exams.  Stay current with your vaccines.  Tell your health care provider if: ? You often feel depressed. ? You have ever been abused or do not feel safe at home. Summary  Adopting a healthy lifestyle and getting preventive care are important in promoting health and wellness.  Follow your health care provider's instructions about healthy diet, exercising, and getting tested or screened for diseases.  Follow your health care provider's instructions on monitoring your cholesterol and blood pressure. This information is not intended to replace advice given to you by your  health care provider. Make sure you discuss any questions you have with your health care provider. Document Revised: 12/24/2017 Document Reviewed: 12/24/2017 Elsevier Patient Education  2020 [place] Chiquetta Langner M.D.

## 2019-12-07 ENCOUNTER — Ambulatory Visit (INDEPENDENT_AMBULATORY_CARE_PROVIDER_SITE_OTHER): Payer: 59 | Admitting: Internal Medicine

## 2019-12-07 ENCOUNTER — Encounter: Payer: Self-pay | Admitting: Internal Medicine

## 2019-12-07 ENCOUNTER — Other Ambulatory Visit: Payer: Self-pay

## 2019-12-07 VITALS — BP 118/68 | HR 62 | Temp 98.1°F | Ht 64.0 in | Wt 151.2 lb

## 2019-12-07 DIAGNOSIS — Z0001 Encounter for general adult medical examination with abnormal findings: Secondary | ICD-10-CM | POA: Diagnosis not present

## 2019-12-07 DIAGNOSIS — Z79899 Other long term (current) drug therapy: Secondary | ICD-10-CM | POA: Diagnosis not present

## 2019-12-07 DIAGNOSIS — E2839 Other primary ovarian failure: Secondary | ICD-10-CM

## 2019-12-07 DIAGNOSIS — G9389 Other specified disorders of brain: Secondary | ICD-10-CM

## 2019-12-07 DIAGNOSIS — Z23 Encounter for immunization: Secondary | ICD-10-CM | POA: Diagnosis not present

## 2019-12-07 DIAGNOSIS — I10 Essential (primary) hypertension: Secondary | ICD-10-CM

## 2019-12-07 LAB — BASIC METABOLIC PANEL WITH GFR
BUN: 14 mg/dL (ref 7–25)
CO2: 30 mmol/L (ref 20–32)
Calcium: 9.9 mg/dL (ref 8.6–10.4)
Chloride: 104 mmol/L (ref 98–110)
Creat: 0.89 mg/dL (ref 0.50–0.99)
GFR, Est African American: 79 mL/min/{1.73_m2} (ref >=60)
GFR, Est Non African American: 68 mL/min/{1.73_m2} (ref >=60)
Glucose, Bld: 104 mg/dL — ABNORMAL HIGH (ref 65–99)
Potassium: 4.4 mmol/L (ref 3.5–5.3)
Sodium: 142 mmol/L (ref 135–146)

## 2019-12-07 LAB — CBC WITH DIFFERENTIAL/PLATELET
Absolute Monocytes: 409 cells/uL (ref 200–950)
Basophils Absolute: 20 cells/uL (ref 0–200)
Basophils Relative: 0.7 %
Eosinophils Absolute: 31 cells/uL (ref 15–500)
Eosinophils Relative: 1.1 %
HCT: 41.9 % (ref 35.0–45.0)
Hemoglobin: 14.1 g/dL (ref 11.7–15.5)
Lymphs Abs: 1585 cells/uL (ref 850–3900)
MCH: 30.5 pg (ref 27.0–33.0)
MCHC: 33.7 g/dL (ref 32.0–36.0)
MCV: 90.7 fL (ref 80.0–100.0)
MPV: 10.7 fL (ref 7.5–12.5)
Monocytes Relative: 14.6 %
Neutro Abs: 756 cells/uL — ABNORMAL LOW (ref 1500–7800)
Neutrophils Relative %: 27 %
Platelets: 234 10*3/uL (ref 140–400)
RBC: 4.62 10*6/uL (ref 3.80–5.10)
RDW: 12 % (ref 11.0–15.0)
Total Lymphocyte: 56.6 %
WBC: 2.8 10*3/uL — ABNORMAL LOW (ref 3.8–10.8)

## 2019-12-07 LAB — HEPATIC FUNCTION PANEL
AG Ratio: 1.4 (calc) (ref 1.0–2.5)
ALT: 18 U/L (ref 6–29)
AST: 17 U/L (ref 10–35)
Albumin: 4.2 g/dL (ref 3.6–5.1)
Alkaline phosphatase (APISO): 50 U/L (ref 37–153)
Bilirubin, Direct: 0.1 mg/dL (ref 0.0–0.2)
Globulin: 2.9 g/dL (calc) (ref 1.9–3.7)
Indirect Bilirubin: 0.5 mg/dL (calc) (ref 0.2–1.2)
Total Bilirubin: 0.6 mg/dL (ref 0.2–1.2)
Total Protein: 7.1 g/dL (ref 6.1–8.1)

## 2019-12-07 LAB — LIPID PANEL
Cholesterol: 204 mg/dL — ABNORMAL HIGH (ref <200)
HDL: 78 mg/dL (ref >=50)
LDL Cholesterol (Calc): 111 mg/dL (calc) — ABNORMAL HIGH
Non-HDL Cholesterol (Calc): 126 mg/dL (calc) (ref <130)
Total CHOL/HDL Ratio: 2.6 (calc) (ref <5.0)
Triglycerides: 64 mg/dL (ref <150)

## 2019-12-07 LAB — TSH: TSH: 1.82 mIU/L (ref 0.40–4.50)

## 2019-12-07 NOTE — Patient Instructions (Addendum)
Continue lifestyle intervention healthy eating and exercise .  Lab  Today  Will notify you  of labs when available.   Contact  [facility] for update time for colonoscopy screening . Bp is at goal .  Will order dexa scan  For  [facility]  X ray   You can call for appt  Even into next year  .   Health Maintenance, Female Adopting a healthy lifestyle and getting preventive care are important in promoting health and wellness. Ask your health care provider about:  The right schedule for you to have regular tests and exams.  Things you can do on your own to prevent diseases and keep yourself healthy. What should I know about diet, weight, and exercise? Eat a healthy diet   Eat a diet that includes plenty of vegetables, fruits, low-fat dairy products, and lean protein.  Do not eat a lot of foods that are high in solid fats, added sugars, or sodium. Maintain a healthy weight Body mass index (BMI) is used to identify weight problems. It estimates body fat based on height and weight. Your health care provider can help determine your BMI and help you achieve or maintain a healthy weight. Get regular exercise Get regular exercise. This is one of the most important things you can do for your health. Most adults should:  Exercise for at least 150 minutes each week. The exercise should increase your heart rate and make you sweat (moderate-intensity exercise).  Do strengthening exercises at least twice a week. This is in addition to the moderate-intensity exercise.  Spend less time sitting. Even light physical activity can be beneficial. Watch cholesterol and blood lipids Have your blood tested for lipids and cholesterol at 65 years of age, then have this test every 5 years. Have your cholesterol levels checked more often if:  Your lipid or cholesterol levels are high.  You are older than 65 years of age.  You are at high risk for heart disease. What should I know about cancer  screening? Depending on your health history and family history, you may need to have cancer screening at various ages. This may include screening for:  Breast cancer.  Cervical cancer.  Colorectal cancer.  Skin cancer.  Lung cancer. What should I know about heart disease, diabetes, and high blood pressure? Blood pressure and heart disease  High blood pressure causes heart disease and increases the risk of stroke. This is more likely to develop in people who have high blood pressure readings, are of African descent, or are overweight.  Have your blood pressure checked: ? Every 3-5 years if you are 35-54 years of age. ? Every year if you are 65 years of age or older. Diabetes Have regular diabetes screenings. This checks your fasting blood sugar level. Have the screening done:  Once every three years after age 21 if you are at a normal weight and have a low risk for diabetes.  More often and at a younger age if you are overweight or have a high risk for diabetes. What should I know about preventing infection? Hepatitis B If you have a higher risk for hepatitis B, you should be screened for this virus. Talk with your health care provider to find out if you are at risk for hepatitis B infection. Hepatitis C Testing is recommended for:  Everyone born from [birth year] through 1966.  Anyone with known risk factors for hepatitis C. Sexually transmitted infections (STIs)  Get screened for STIs, including gonorrhea and  chlamydia, if: ? You are sexually active and are younger than 65 years of age. ? You are older than 65 years of age and your health care provider tells you that you are at risk for this type of infection. ? Your sexual activity has changed since you were last screened, and you are at increased risk for chlamydia or gonorrhea. Ask your health care provider if you are at risk.  Ask your health care provider about whether you are at high risk for HIV. Your health care provider may  recommend a prescription medicine to help prevent HIV infection. If you choose to take medicine to prevent HIV, you should first get tested for HIV. You should then be tested every 3 months for as long as you are taking the medicine. Pregnancy  If you are about to stop having your period (premenopausal) and you may become pregnant, seek counseling before you get pregnant.  Take 400 to 800 micrograms (mcg) of folic acid every day if you become pregnant.  Ask for birth control (contraception) if you want to prevent pregnancy. Osteoporosis and menopause Osteoporosis is a disease in which the bones lose minerals and strength with aging. This can result in bone fractures. If you are 93 years old or older, or if you are at risk for osteoporosis and fractures, ask your health care provider if you should:  Be screened for bone loss.  Take a calcium or vitamin D supplement to lower your risk of fractures.  Be given hormone replacement therapy (HRT) to treat symptoms of menopause. Follow these instructions at home: Lifestyle  Do not use any products that contain nicotine or tobacco, such as cigarettes, e-cigarettes, and chewing tobacco. If you need help quitting, ask your health care provider.  Do not use street drugs.  Do not share needles.  Ask your health care provider for help if you need support or information about quitting drugs. Alcohol use  Do not drink alcohol if: ? Your health care provider tells you not to drink. ? You are pregnant, may be pregnant, or are planning to become pregnant.  If you drink alcohol: ? Limit how much you use to 0-1 drink a day. ? Limit intake if you are breastfeeding.  Be aware of how much alcohol is in your drink. In the [place] one drink equals one 12 oz bottle of beer (355 mL), one 5 oz glass of wine (148 mL), or one 1 oz glass of hard liquor (44 mL). General instructions  Schedule regular health, dental, and eye exams.  Stay current with your  vaccines.  Tell your health care provider if: ? You often feel depressed. ? You have ever been abused or do not feel safe at home. Summary  Adopting a healthy lifestyle and getting preventive care are important in promoting health and wellness.  Follow your health care provider's instructions about healthy diet, exercising, and getting tested or screened for diseases.  Follow your health care provider's instructions on monitoring your cholesterol and blood pressure. This information is not intended to replace advice given to you by your health care provider. Make sure you discuss any questions you have with your health care provider. Document Revised: 12/24/2017 Document Reviewed: 12/24/2017 Elsevier Patient Education  2020 [employer].

## 2019-12-08 NOTE — Progress Notes (Signed)
So labs stable except total WBC is lower that usual for you  . But rest of blood count is fine. This may be a normal flucuation for you  but advise  follow up .   Arrange cbcdiff in  1-2 months dx Low WBC  Please place future orders .

## 2020-01-24 ENCOUNTER — Other Ambulatory Visit: Payer: Self-pay | Admitting: Internal Medicine

## 2020-01-24 DIAGNOSIS — D72819 Decreased white blood cell count, unspecified: Secondary | ICD-10-CM

## 2020-01-24 NOTE — Progress Notes (Signed)
Per PCP in last lab results orders placed for CBC/thx dmf

## 2020-02-07 ENCOUNTER — Other Ambulatory Visit: Payer: Self-pay

## 2020-02-07 ENCOUNTER — Other Ambulatory Visit (INDEPENDENT_AMBULATORY_CARE_PROVIDER_SITE_OTHER): Payer: 59

## 2020-02-07 DIAGNOSIS — D72819 Decreased white blood cell count, unspecified: Secondary | ICD-10-CM | POA: Diagnosis not present

## 2020-02-07 LAB — CBC WITH DIFFERENTIAL/PLATELET
Basophils Absolute: 0 10*3/uL (ref 0.0–0.1)
Basophils Relative: 0.4 % (ref 0.0–3.0)
Eosinophils Absolute: 0.1 10*3/uL (ref 0.0–0.7)
Eosinophils Relative: 2 % (ref 0.0–5.0)
HCT: 38.8 % (ref 36.0–46.0)
Hemoglobin: 13.3 g/dL (ref 12.0–15.0)
Lymphocytes Relative: 55.7 % — ABNORMAL HIGH (ref 12.0–46.0)
Lymphs Abs: 2 10*3/uL (ref 0.7–4.0)
MCHC: 34.3 g/dL (ref 30.0–36.0)
MCV: 88.8 fl (ref 78.0–100.0)
Monocytes Absolute: 0.5 10*3/uL (ref 0.1–1.0)
Monocytes Relative: 13.2 % — ABNORMAL HIGH (ref 3.0–12.0)
Neutro Abs: 1 10*3/uL — ABNORMAL LOW (ref 1.4–7.7)
Neutrophils Relative %: 28.7 % — ABNORMAL LOW (ref 43.0–77.0)
Platelets: 208 10*3/uL (ref 150.0–400.0)
RBC: 4.37 Mil/uL (ref 3.87–5.11)
RDW: 13.1 % (ref 11.5–15.5)
WBC: 3.5 10*3/uL — ABNORMAL LOW (ref 4.0–10.5)

## 2020-02-08 ENCOUNTER — Other Ambulatory Visit: Payer: Self-pay

## 2020-02-08 DIAGNOSIS — D72819 Decreased white blood cell count, unspecified: Secondary | ICD-10-CM

## 2020-02-08 NOTE — Progress Notes (Signed)
So white blood cell count is still low but better than last visit /check . These findings  may  be normal for you .There is no anemia and platelets are normal which is reassuring that you do not have any kind of  serious blood disease.   However I do advise that we follow your blood count to make sure it does not become more out of range.   If needed we can always get a hematologist to look at your blood counts and decide if they are important to your health. If you become sick such as fevers unexplained illness we will repeat the blood count ,otherwise would advise repeat CBC and differential in 3 to 4 months.  To make sure stable.( please arrange for and order cbcdiff future in system)

## 2020-02-08 NOTE — Telephone Encounter (Signed)
Hi ms Whorley   See  Message in result note I sent today .  Pleas arrange for fu monitoring cbc and diff   If more /s then  Arrange a virtual visit  To discuss

## 2020-04-06 ENCOUNTER — Other Ambulatory Visit: Payer: Self-pay | Admitting: Internal Medicine

## 2020-04-28 ENCOUNTER — Ambulatory Visit: Payer: 59 | Attending: Internal Medicine

## 2020-04-28 ENCOUNTER — Other Ambulatory Visit: Payer: Self-pay

## 2020-04-28 DIAGNOSIS — Z23 Encounter for immunization: Secondary | ICD-10-CM

## 2020-04-28 NOTE — Progress Notes (Signed)
   Covid-19 Vaccination Clinic  Name:  SARAE NICHOLES    MRN: [id number] DOB: 11/21/1955  04/28/2020  Ms. Woodson was observed post Covid-19 immunization for 15 minutes without incident. She was provided with Vaccine Information Sheet and instruction to access the V-Safe system.   Ms. Pasion was instructed to call 911 with any severe reactions post vaccine: Marland Kitchen Difficulty breathing  . Swelling of face and throat  . A fast heartbeat  . A bad rash all over body  . Dizziness and weakness   Immunizations Administered    Name Date Dose VIS Date Route   PFIZER Comrnaty(Gray TOP) Covid-19 Vaccine 04/28/2020  2:44 PM 0.3 mL 12/23/2019 Intramuscular   Manufacturer: [employer]   Lot: [id number]   [place]: [id number]

## 2020-05-01 ENCOUNTER — Other Ambulatory Visit (HOSPITAL_BASED_OUTPATIENT_CLINIC_OR_DEPARTMENT_OTHER): Payer: Self-pay

## 2020-05-01 MED ORDER — COVID-19 MRNA VACCINE (PFIZER) 30 MCG/0.3ML IM SUSP
INTRAMUSCULAR | 0 refills | Status: DC
  Filled 2020-05-01: qty 0.3, 1d supply, fill #0

## 2020-05-15 ENCOUNTER — Other Ambulatory Visit: Payer: 59

## 2020-10-10 ENCOUNTER — Telehealth: Payer: Self-pay | Admitting: Internal Medicine

## 2020-10-10 DIAGNOSIS — D72819 Decreased white blood cell count, unspecified: Secondary | ICD-10-CM

## 2020-10-10 DIAGNOSIS — I10 Essential (primary) hypertension: Secondary | ICD-10-CM

## 2020-10-10 DIAGNOSIS — Z79899 Other long term (current) drug therapy: Secondary | ICD-10-CM

## 2020-10-10 DIAGNOSIS — G9389 Other specified disorders of brain: Secondary | ICD-10-CM

## 2020-10-10 DIAGNOSIS — R7309 Other abnormal glucose: Secondary | ICD-10-CM

## 2020-10-10 NOTE — Telephone Encounter (Signed)
Patient called today stating that she was supposed to have some blood tests done back in May and had to cancel them. She made an appointment for her physical for the end of November and would like orders put in for those tests again so she can have the results by the time her appointment comes.  Patient would like a call to schedule once orders have been put in.  Please advise.

## 2020-10-11 ENCOUNTER — Telehealth: Payer: Self-pay | Admitting: Internal Medicine

## 2020-10-11 NOTE — Telephone Encounter (Signed)
Left a message for the pt to return my call.  

## 2020-10-11 NOTE — Telephone Encounter (Signed)
Orders have been placed for lab.  Fasting if possible

## 2020-10-11 NOTE — Telephone Encounter (Signed)
TELEPHONE ENCOUNTER PRIMARY CARE PROVIDER: Burnis Medin, MD  REASON FOR CALL:  pt said she does not need the medication now; she is requesting this so the pharmacy could have a new script on file  losartan-hydrochlorothiazide (HYZAAR) 100-25 MG tablet need for 90 day supply with 3 refills  [id number] [address], [place] AT [place]  Phone: [phone] Fax:  [phone]   LAST VISIT IN THIS CLINIC:  10/10/2020 NEXT APPOINTMENT SCHEDULED IN THIS CLINIC: 12/11/2020

## 2020-10-12 NOTE — Telephone Encounter (Signed)
Left a message for the pt to return my call.  

## 2020-10-13 NOTE — Telephone Encounter (Signed)
I left a detailed message on the pt home vm informing pt of the message below.

## 2020-10-19 MED ORDER — LOSARTAN POTASSIUM-HCTZ 100-25 MG PO TABS: 1.0000 | ORAL_TABLET | Freq: Every day | ORAL | 1 refills | Status: DC

## 2020-10-19 NOTE — Addendum Note (Signed)
Addended by: Elza Rafter D on: 10/19/2020 04:07 PM   Modules accepted: Orders

## 2020-11-06 ENCOUNTER — Other Ambulatory Visit: Payer: Self-pay

## 2020-11-07 ENCOUNTER — Ambulatory Visit (INDEPENDENT_AMBULATORY_CARE_PROVIDER_SITE_OTHER): Payer: PPO | Admitting: Family Medicine

## 2020-11-07 VITALS — BP 120/64 | HR 60 | Temp 97.9°F | Wt 154.8 lb

## 2020-11-07 DIAGNOSIS — M25512 Pain in left shoulder: Secondary | ICD-10-CM | POA: Diagnosis not present

## 2020-11-07 NOTE — Progress Notes (Signed)
Established Patient Office Visit  Subjective:  Patient ID: Stacey Young, female    DOB: April 22, 1955  Age: 65 y.o. MRN: [id number]  CC:  Chief Complaint  Patient presents with   Fall    X 2 months, Left shoulder pain, limited range of motion, old injury in this same shoulder that was never treated.     HPI [name] presents for subacute pain left shoulder region.  She states 2 months ago she was going down in her garage and missed the bottom step.  She fell forward and actually landed on her left shoulder.  She had some pain since then with external rotation and abduction and limited pain with internal rotation.  Does have some nighttime pain.  Range of motion is not really limited but she does have pain with full range of motion and has had some difficulties with things like changing clothes because of her pain.  She does not take any medications for this.  No neck pain.  Right-hand-dominant.  She did not see any bruising or obvious swelling after the injury.  Past Medical History:  Diagnosis Date   Anemia    Back pain    Fibroids    uterine   Hay fever    Hx of colonoscopy    utd   Hx of transfusion    w gall baldder surgery  donates blood ( thus eg screen)   Hx of varicella    as a child   Hypertension    IBS (irritable bowel syndrome)    WBC decreased     Past Surgical History:  Procedure Laterality Date   BLADDER REPAIR W/ [place]   VESICOVAGINAL FISTULA CLOSURE W/ [facility]  2003   for fibroids and endometriosis    Family History  Problem Relation Age of Onset   Arthritis Mother    Obstructive Sleep Apnea Mother        on CPAP   Cancer Father        bladder   Hyperlipidemia Father    Migraines Sister    Hyperlipidemia Sister        low wbc   Dementia Other        older aunt uncle gm 59s and 68s    Social History   Socioeconomic History   Marital status: Married    Spouse name: Not on file   Number of  children: Not on file   Years of education: Not on file   Highest education level: Not on file  Occupational History   Not on file  Tobacco Use   Smoking status: Never   Smokeless tobacco: Never  Vaping Use   Vaping Use: Never used  Substance and Sexual Activity   Alcohol use: Yes    Alcohol/week: 14.0 standard drinks    Types: 14 Glasses of wine per week    Comment: "2 per night"    Drug use: No   Sexual activity: Not on file  Other Topics Concern   Not on file  Social History Narrative   Never Smoked   Alcohol use-yes about 2 per day red wine   Regular Exercise-yes   Drug use- no   hh of 2    Pet dog   Married education PhD   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 0.3 mL 0   donepezil (ARICEPT) 10 MG tablet Take 10 mg by mouth daily.     losartan-hydrochlorothiazide (HYZAAR) 100-25 MG tablet Take 1 tablet by mouth daily. 90 tablet 1   vitamin B-12 (CYANOCOBALAMIN) 100 MCG tablet Take 100 mcg by mouth daily.     XIIDRA 5 % SOLN INSTILL 1 DROP INTO BOTH EYES BID     No facility-administered medications prior to visit.    No Known Allergies  ROS Review of Systems  Musculoskeletal:  Negative for neck pain.  Neurological:  Negative for weakness and numbness.     Objective:    Physical Exam Vitals reviewed.  Constitutional:      Appearance: Normal appearance.  Cardiovascular:     Rate and Rhythm: Normal rate.  Musculoskeletal:     Comments: Left  shoulder reveals no tenderness except for some very mild subacromial tenderness.  She has good range of motion.  No biceps tenderness.  No acromioclavicular tenderness.  No significant pain with internal rotation.  She has some pain with abduction against resistance greater than 90 degrees  Neurological:     Mental Status: She is alert.    BP 120/64 (BP Location: Left Arm, Patient Position: Sitting, Cuff Size: Normal)   Pulse 60   Temp 97.9 F (36.6 C) (Oral)   Wt 154 lb 12.8 oz (70.2 kg)   SpO2 99%   BMI 26.57 kg/m  Wt Readings from Last 3 Encounters:  11/07/20 154 lb 12.8 oz (70.2 kg)  12/07/19 151 lb 3.2 oz (68.6 kg)  02/22/19 152 lb (68.9 kg)     Health Maintenance Due  Topic Date Due   DEXA SCAN  Never done   COLONOSCOPY (Pts 45-72yrs Insurance coverage will need to be confirmed)  12/16/2019   COVID-19 Vaccine (5 - Booster for Pfizer series) 06/23/2020   INFLUENZA VACCINE  08/14/2020   Pneumonia Vaccine 61+ Years old (2 - PPSV23 if available, else PCV20) 12/06/2020    There are no preventive care reminders to display for this patient.  Lab Results  Component Value Date   TSH 1.82 12/07/2019   Lab Results  Component Value Date   WBC 3.5 (L) 02/07/2020   HGB 13.3 02/07/2020   HCT 38.8 02/07/2020   MCV 88.8 02/07/2020   PLT 208.0 02/07/2020   Lab Results  Component Value Date   NA 142 12/07/2019   K 4.4 12/07/2019   CO2 30 12/07/2019   GLUCOSE 104 (H) 12/07/2019   BUN 14 12/07/2019   CREATININE 0.89 12/07/2019   BILITOT 0.6 12/07/2019   ALKPHOS 57 02/22/2019   AST 17 12/07/2019   ALT 18 12/07/2019   PROT 7.1 12/07/2019   ALBUMIN 4.0 02/22/2019   CALCIUM 9.9 12/07/2019   ANIONGAP 8 02/22/2019   GFR 101.78 11/30/2018   Lab Results  Component Value Date   CHOL 204 (H) 12/07/2019   Lab Results  Component Value Date   HDL 78 12/07/2019   Lab Results  Component Value Date   LDLCALC 111 (H) 12/07/2019   Lab Results  Component Value Date   TRIG 64  12/07/2019   Lab Results  Component Value Date   CHOLHDL 2.6 12/07/2019   Lab Results  Component Value Date   HGBA1C 5.7 11/14/2017      Assessment & Plan:   Problem List Items Addressed This Visit   None Visit Diagnoses     Acute pain of left shoulder    -  Primary   Relevant Orders   Ambulatory referral to Sports Medicine     Patient relates 82-month history of left shoulder pain following fall.  Does not seem to have any severe rotator cuff weakness.  Hopefully this represents more inflammation  -Given duration of symptoms we will set up sports medicine referral for further evaluation and patient agrees with this plan  No orders of the defined types were placed in this encounter.   Follow-up: No follow-ups on file.    Carolann Littler, MD

## 2020-11-10 ENCOUNTER — Other Ambulatory Visit: Payer: Self-pay | Admitting: Internal Medicine

## 2020-11-10 DIAGNOSIS — Z1231 Encounter for screening mammogram for malignant neoplasm of breast: Secondary | ICD-10-CM

## 2020-11-14 ENCOUNTER — Ambulatory Visit: Payer: PPO | Admitting: Family Medicine

## 2020-11-14 ENCOUNTER — Ambulatory Visit (INDEPENDENT_AMBULATORY_CARE_PROVIDER_SITE_OTHER): Payer: PPO

## 2020-11-14 ENCOUNTER — Ambulatory Visit: Payer: Self-pay

## 2020-11-14 ENCOUNTER — Other Ambulatory Visit: Payer: Self-pay

## 2020-11-14 ENCOUNTER — Encounter: Payer: Self-pay | Admitting: Family Medicine

## 2020-11-14 VITALS — BP 104/62 | HR 65 | Ht 64.0 in | Wt 152.8 lb

## 2020-11-14 DIAGNOSIS — G8929 Other chronic pain: Secondary | ICD-10-CM | POA: Diagnosis not present

## 2020-11-14 DIAGNOSIS — M25512 Pain in left shoulder: Secondary | ICD-10-CM | POA: Diagnosis not present

## 2020-11-14 DIAGNOSIS — M19012 Primary osteoarthritis, left shoulder: Secondary | ICD-10-CM | POA: Diagnosis not present

## 2020-11-14 NOTE — Patient Instructions (Addendum)
Nice to meet you today.  Please get an Xray today before you leave.  I've referred you to Physical Therapy.  Please let us know if you haven't heard from them in the next week regarding scheduling.  Follow-up: 6 weeks

## 2020-11-14 NOTE — Progress Notes (Signed)
I, Wendy Poet, LAT, ATC, am serving as scribe for Dr. Lynne Leader.  Subjective:    CC: L shoulder pain  HPI: Pt is a 65 y/o female c/o L shoulder pain ongoing for about 2 months. Pt is R-hand dominate. MOI: Pt suffered a fall when going down the garage steps, missing the bottom 2 steps, falling forward, landing on her L shoulder. Pt locates pain to her L ant shoulder .  She also has a hx of prior L shoulder pain years ago.  Neck pain: no Radiates: No L shoulder mechanical symptoms: intermittently Aggravates: getting dressed, nighttime; pressure to the area; L shoulder overhead AROM; laying on her L side Treatments tried: Nothing in particular  Pertinent review of Systems: No fevers or chills  Relevant historical information: Hypertension   Objective:    Vitals:   11/14/20 1407  BP: 104/62  Pulse: 65  SpO2: 98%   General: Well Developed, well nourished, and in no acute distress.   MSK: Left shoulder: Normal-appearing Normal motion some pain with abduction and internal rotation. Intact strength pain with resisted abduction. Positive Hawkins and Neer's test.  Mildly positive empty can test. Negative Yergason's and speeds test. Pulses capillary refill and sensation are intact distally.  Lab and Radiology Results  Diagnostic Limited MSK Ultrasound of: Left shoulder Biceps tendon intact normal-appearing Subscapularis tendon is normal-appearing Supraspinatus tendon is intact with mild subacromial bursitis present. Infraspinatus tendon normal. AC joint normal-appearing Impression: Subacromial bursitis  X-ray images left shoulder obtained today personally and independently interpreted Mild AC DJD.  No glenohumeral DJD.  No acute fractures. Await formal radiology review  Impression and Recommendations:    Assessment and Plan: 65 y.o. female with left shoulder pain.  Pain occurred after fall occurring 2 to 3 months ago.  Clinically she is doing quite well with good  strength.  Ultrasound does not show an obvious rotator cuff tear.  She is a good candidate for physical therapy.  Plan to refer to PT and check back in about 6 weeks or soif not doing well.Marland Kitchen  PDMP not reviewed this encounter. Orders Placed This Encounter  Procedures   [place] LIMITED JOINT SPACE STRUCTURES UP LEFT(NO LINKED CHARGES)    Standing Status:   Future    Number of Occurrences:   1    Standing Expiration Date:   05/14/2021    Order Specific Question:   Reason for Exam (SYMPTOM  OR DIAGNOSIS REQUIRED)    Answer:   left shoulder pain    Order Specific Question:   Preferred imaging location?    Answer:   [facility]   DG Shoulder Left    Standing Status:   Future    Number of Occurrences:   1    Standing Expiration Date:   11/14/2021    Order Specific Question:   Reason for Exam (SYMPTOM  OR DIAGNOSIS REQUIRED)    Answer:   left shoulder pain    Order Specific Question:   Preferred imaging location?    Answer:   [facility]   Ambulatory referral to Physical Therapy    Referral Priority:   Routine    Referral Type:   Physical Medicine    Referral Reason:   Specialty Services Required    Requested Specialty:   Physical Therapy    Number of Visits Requested:   1   No orders of the defined types were placed in this encounter.   Discussed warning signs or symptoms. Please see  discharge instructions. Patient expresses understanding.   The above documentation has been reviewed and is accurate and complete Lynne Leader, M.D.

## 2020-11-15 NOTE — Progress Notes (Signed)
Left shoulder x-ray shows arthritis changes in the shoulder joint and of the small joint at the top of the shoulder (AC joint)

## 2020-11-27 ENCOUNTER — Ambulatory Visit (HOSPITAL_BASED_OUTPATIENT_CLINIC_OR_DEPARTMENT_OTHER): Payer: PPO | Admitting: Physical Therapy

## 2020-11-29 DIAGNOSIS — F411 Generalized anxiety disorder: Secondary | ICD-10-CM | POA: Diagnosis not present

## 2020-12-05 ENCOUNTER — Encounter (HOSPITAL_BASED_OUTPATIENT_CLINIC_OR_DEPARTMENT_OTHER): Payer: Self-pay | Admitting: Physical Therapy

## 2020-12-05 ENCOUNTER — Other Ambulatory Visit: Payer: Self-pay

## 2020-12-05 ENCOUNTER — Ambulatory Visit (HOSPITAL_BASED_OUTPATIENT_CLINIC_OR_DEPARTMENT_OTHER): Payer: PPO | Attending: Family Medicine | Admitting: Physical Therapy

## 2020-12-05 DIAGNOSIS — M6281 Muscle weakness (generalized): Secondary | ICD-10-CM | POA: Insufficient documentation

## 2020-12-05 DIAGNOSIS — M25512 Pain in left shoulder: Secondary | ICD-10-CM | POA: Insufficient documentation

## 2020-12-05 DIAGNOSIS — G8929 Other chronic pain: Secondary | ICD-10-CM | POA: Diagnosis not present

## 2020-12-05 DIAGNOSIS — M25612 Stiffness of left shoulder, not elsewhere classified: Secondary | ICD-10-CM | POA: Diagnosis not present

## 2020-12-05 NOTE — Therapy (Addendum)
OUTPATIENT PHYSICAL THERAPY SHOULDER EVALUATION  PHYSICAL THERAPY DISCHARGE SUMMARY  Visits from Start of Care: 1  Plan: Patient agrees to discharge.  Patient goals were not met. Patient is being discharged due to not returning to PT.         Patient Name: Stacey Young MRN: [id number] DOB:06/11/1955, 65 y.o., female Today's Date: 12/05/2020   PT End of Session - 12/05/20 1653     Visit Number 1    Number of Visits 13    Date for PT Re-Evaluation 03/05/21    Authorization Type [employer]    PT Start Time 1645    PT Stop Time 9678    PT Time Calculation (min) 45 min    Activity Tolerance Patient limited by pain    Behavior During Therapy WFL for tasks assessed/performed             Past Medical History:  Diagnosis Date   Anemia    Back pain    Fibroids    uterine   Hay fever    Hx of colonoscopy    utd   Hx of transfusion    w gall baldder surgery  donates blood ( thus eg screen)   Hx of varicella    as a child   Hypertension    IBS (irritable bowel syndrome)    WBC decreased    Past Surgical History:  Procedure Laterality Date   BLADDER REPAIR W/ [place]   VESICOVAGINAL FISTULA CLOSURE W/ [facility]  2003   for fibroids and endometriosis   Patient Active Problem List   Diagnosis Date Noted   Acute medial meniscal tear 09/11/2015   Patellofemoral syndrome of both knees 09/11/2015   Knee MCL sprain 09/11/2015   Essential hypertension 11/19/2013   Occasional numbness/prickling/tingling right mid toes 12/09/2012   Family history of diabetes mellitus 12/09/2012   Anterior knee pain 12/03/2010   Sleep disturbances 12/03/2010   Visit for preventive health examination 12/03/2010   Foot pain, left 09/22/2010   LEUKOPENIA, MILD 11/13/2009   BACK PAIN 07/24/2009   HYPERGLYCEMIA 07/24/2009    PCP: Burnis Medin, MD  REFERRING PROVIDER: Gregor Hams, MD  REFERRING DIAG: [phone] (ICD-10-CM) -  Chronic left shoulder pain  THERAPY DIAG:  Stiffness of left shoulder, not elsewhere classified  Chronic left shoulder pain  Muscle weakness (generalized)   ONSET DATE: 08/2020  SUBJECTIVE:  SUBJECTIVE STATEMENT: Pt state she was going down to garage, tripped, and landed directly onto lateral shoulder. L shoulder pain ongoing for about 3 months. She denies NT down L UE. Pt denies popping clicking catching of L shoulder. Pt denies feelings of instability. Pt unsure of pushing or pulling pain with the L UE due to avoidng using L arm. Pt denies significant feeling of stiffness. She has stopped doing certain motions bc they are uncomfortable. Pulling up bed and sheets will cause twinges of pain. Pt does have pain with sleeping on the L side. Pt denies cancers red flag.   "MOI: Pt suffered a fall when going down the garage steps, missing the bottom 2 steps, falling forward, landing on her L shoulder"  PERTINENT HISTORY:  No history of prior L shoulder injury; LBP; bilat knee pain; Mild Cog. Impairment ([facility] 2021)  PAIN:  Are you having pain? Yes VAS scale: 4/10, Best 0/10, Worst 5/10 Pain location: posterior, anterior, and lateral shoulder Pain orientation: Left  PAIN TYPE: "discomfort" dull/achey Pain description: intermittent  Aggravating factors: undressing, going [place], taking off shirt, external rotation Relieving factors: resting, "ignoring"  PRECAUTIONS: None  WEIGHT BEARING RESTRICTIONS No  FALLS:  Has patient fallen in last 6 months? Yes Number of falls: 1 MOI fall  LIVING ENVIRONMENT: Lives with: lives with their family Lives in: House/apartment Stairs: Yes, 2 story home Has following equipment at home: None  PLOF: Independent, pt enjoys reading and walking for leisure  PATIENT GOALS : Pt  would like to be able to dress without pain and to get rid of pain. Wants to prevent it from getting worse.   OBJECTIVE:   DIAGNOSTIC FINDINGS:  Diagnostic Limited MSK Ultrasound of: Left shoulder Biceps tendon intact normal-appearing Subscapularis tendon is normal-appearing Supraspinatus tendon is intact with mild subacromial bursitis present. Infraspinatus tendon normal. AC joint normal-appearing Impression: Subacromial bursitis   EXAM: LEFT SHOULDER - 2+ VIEW   IMPRESSION: Acromioclavicular glenohumeral degenerative change. No acute abnormality.    PATIENT SURVEYS:  FOTO 60 68 at D/C MCII 2pts  COGNITION:  Overall cognitive status: Within functional limits for tasks assessed     SENSATION:  Light touch: Appears intact   PALPATION: TTP and hypertonicity of L UT, anterior deltoid/pec  POSTURE: Forward/rounded shoulders  UPPER EXTREMITY AROM/PROM:  Bilateral UE ROM WFL Painful ROM on L with all reaching past 90 degs of flexion or ABD Crossbody ABD painful  UPPER EXTREMITY MMT:  MMT Right 12/05/2020 Left 12/05/2020  Shoulder flexion 4/5 4-/5 p!  Shoulder extension    Shoulder abduction 4/5 4-/5 p!  Shoulder IR 4/5 4-/5 p!  Shoulder ER 4/5 4-/5 p!  (Blank rows = not tested)  SHOULDER SPECIAL TESTS:  Impingement tests: Neer impingement test: negative, Hawkins/Kennedy impingement test: negative, and Painful arc test: positive   SLAP lesions: Crank test: negative and Biceps load test: negative   Rotator cuff assessment: Empty can test: negative and Belly press test: negative  Biceps assessment: Speed's test: positive   JOINT MOBILITY TESTING:  L GHJ stiffness with inf and post glide    TODAY'S TREATMENT:    Exercises Seated Scapular Retraction - 2 x daily - 7 x weekly - 2 sets - 10 reps Isometric Shoulder External Rotation - 2 x daily - 7 x weekly - 2 sets - 10 reps - 5 hold Supine Shoulder Flexion Extension AAROM with Dowel - 2 x daily - 7 x  weekly - 1 sets - 15 reps - 5 hold  PATIENT EDUCATION: Education details: MOI, diagnosis, prognosis, anatomy, exercise progression, DOMS expectations, muscle firing,  envelope of function, HEP, POC  Person educated: Patient Education method: Explanation, Demonstration, Tactile cues, Verbal cues, and Handouts Education comprehension: verbalized understanding, returned demonstration, verbal cues required, and tactile cues required   HOME EXERCISE PROGRAM:  Access Code: [email or web address] URL: [email or web address] Date: 12/05/2020 Prepared by: Daleen Bo  ASSESSMENT:  CLINICAL IMPRESSION: Patient is a 65 y.o. female who was seen today for physical therapy evaluation and treatment for cc of L shoulder pain. Pt's s/s appear consistent with traumatic fall injury onto L shoulder resulting in anterior muscle irritation as well as joint stiffness. Clinical testing does not suggest internal derangement or adhesive capsulitis at this time, though pt is potentially at risk given recent report of continued disuse. Pt's pain is moderately sensitive and irritable. Pt with most difficulty and pain with resisted ER and flexion motions. Objective impairments include decreased ROM, decreased strength, hypomobility, increased muscle spasms, impaired flexibility, impaired UE functional use, improper body mechanics, postural dysfunction, and pain. These impairments are limiting patient from cleaning, community activity, meal prep, laundry, yard work, and shopping. Personal factors including Age, Behavior pattern, Fitness, Time since onset of injury/illness/exacerbation, and 1 comorbidity:    are also affecting patient's functional outcome. Patient will benefit from skilled PT to address above impairments and improve overall function.  REHAB POTENTIAL: Good  CLINICAL DECISION MAKING: Stable/uncomplicated  EVALUATION COMPLEXITY: Low   GOALS:   SHORT TERM GOALS:  STG Name Target Date Goal status  1  Pt will become independent with HEP in order to demonstrate synthesis of PT education.  12/19/2020 INITIAL  2 Pt will report at least 2 pt reduction on NPRS scale for pain in order to demonstrate functional improvement with household activity, self care, and ADL.   01/02/2021 INITIAL  3 Pt will be able to demonstrate full L UE ROM/symmetrical ROM in order to demonstrate functional improvement in UE function for self-care and house hold duties.    01/02/2021 INITIAL   LONG TERM GOALS:   LTG Name Target Date Goal status  1 Pt  will become independent with final HEP in order to demonstrate synthesis of PT education.  01/30/2021 INITIAL  2 Pt will be able to demonstrate/report ability to take off coat or open/pull doors objects without pain in order to demonstrate functional improvement in UE function for self-care and house hold duties.   01/30/2021 INITIAL  3 Pt will score >/= 68 on FOTO to demonstrate functional improvement in L shoulder pain.   01/30/2021 INITIAL  4 Pt will be able to reach [facility] and carry/hold >5 lbs in order to demonstrate functional improvement in L UE strength for return to PLOF and exercise.   01/30/2021 INITIAL   PLAN: PT FREQUENCY: 1-2x/week  PT DURATION: 12 weeks (likely D/C by 8 weeks)  PLANNED INTERVENTIONS: Therapeutic exercises, Therapeutic activity, Neuro Muscular re-education, Balance training, Gait training, Patient/Family education, Joint mobilization, Aquatic Therapy, Dry Needling, Electrical stimulation, Spinal mobilization, Cryotherapy, Moist heat, Compression bandaging, scar mobilization, Taping, Vasopneumatic device, Traction, Ultrasound, Ionotophoresis 23m/ml Dexamethasone, and Manual therapy  PLAN FOR NEXT SESSION: review HEP, joint mob, wall push ups, pec stretching, cane ER  ADaleen BoPT, DPT 12/05/20 5:52 PM

## 2020-12-11 ENCOUNTER — Ambulatory Visit (INDEPENDENT_AMBULATORY_CARE_PROVIDER_SITE_OTHER): Payer: PPO | Admitting: Internal Medicine

## 2020-12-11 ENCOUNTER — Encounter: Payer: Self-pay | Admitting: Internal Medicine

## 2020-12-11 VITALS — BP 122/60 | HR 61 | Temp 97.8°F | Ht 64.0 in | Wt 151.0 lb

## 2020-12-11 DIAGNOSIS — D72819 Decreased white blood cell count, unspecified: Secondary | ICD-10-CM

## 2020-12-11 DIAGNOSIS — G3184 Mild cognitive impairment, so stated: Secondary | ICD-10-CM | POA: Diagnosis not present

## 2020-12-11 DIAGNOSIS — Z79899 Other long term (current) drug therapy: Secondary | ICD-10-CM | POA: Diagnosis not present

## 2020-12-11 DIAGNOSIS — Z0000 Encounter for general adult medical examination without abnormal findings: Secondary | ICD-10-CM

## 2020-12-11 DIAGNOSIS — E538 Deficiency of other specified B group vitamins: Secondary | ICD-10-CM

## 2020-12-11 DIAGNOSIS — I10 Essential (primary) hypertension: Secondary | ICD-10-CM

## 2020-12-11 DIAGNOSIS — Z23 Encounter for immunization: Secondary | ICD-10-CM

## 2020-12-11 DIAGNOSIS — Z1211 Encounter for screening for malignant neoplasm of colon: Secondary | ICD-10-CM | POA: Diagnosis not present

## 2020-12-11 LAB — LIPID PANEL
Cholesterol: 201 mg/dL — ABNORMAL HIGH (ref 0–200)
HDL: 67.2 mg/dL (ref >39.00)
LDL Cholesterol: 99 mg/dL (ref 0–99)
NonHDL: 133.54
Total CHOL/HDL Ratio: 3
Triglycerides: 175 mg/dL — ABNORMAL HIGH (ref 0.0–149.0)
VLDL: 35 mg/dL (ref 0.0–40.0)

## 2020-12-11 LAB — BASIC METABOLIC PANEL
BUN: 19 mg/dL (ref 6–23)
CO2: 29 mEq/L (ref 19–32)
Calcium: 9.6 mg/dL (ref 8.4–10.5)
Chloride: 103 mEq/L (ref 96–112)
Creatinine, Ser: 0.78 mg/dL (ref 0.40–1.20)
GFR: 79.1 mL/min (ref >60.00)
Glucose, Bld: 109 mg/dL — ABNORMAL HIGH (ref 70–99)
Potassium: 3.6 mEq/L (ref 3.5–5.1)
Sodium: 140 mEq/L (ref 135–145)

## 2020-12-11 LAB — CBC WITH DIFFERENTIAL/PLATELET
Basophils Absolute: 0 10*3/uL (ref 0.0–0.1)
Basophils Relative: 0.4 % (ref 0.0–3.0)
Eosinophils Absolute: 0 10*3/uL (ref 0.0–0.7)
Eosinophils Relative: 0.9 % (ref 0.0–5.0)
HCT: 40.4 % (ref 36.0–46.0)
Hemoglobin: 13.7 g/dL (ref 12.0–15.0)
Lymphocytes Relative: 47.7 % — ABNORMAL HIGH (ref 12.0–46.0)
Lymphs Abs: 2.2 10*3/uL (ref 0.7–4.0)
MCHC: 33.8 g/dL (ref 30.0–36.0)
MCV: 90 fl (ref 78.0–100.0)
Monocytes Absolute: 0.5 10*3/uL (ref 0.1–1.0)
Monocytes Relative: 10.7 % (ref 3.0–12.0)
Neutro Abs: 1.9 10*3/uL (ref 1.4–7.7)
Neutrophils Relative %: 40.3 % — ABNORMAL LOW (ref 43.0–77.0)
Platelets: 231 10*3/uL (ref 150.0–400.0)
RBC: 4.49 Mil/uL (ref 3.87–5.11)
RDW: 12.6 % (ref 11.5–15.5)
WBC: 4.6 10*3/uL (ref 4.0–10.5)

## 2020-12-11 LAB — HEMOGLOBIN A1C: Hgb A1c MFr Bld: 5.7 % (ref 4.6–6.5)

## 2020-12-11 LAB — HEPATIC FUNCTION PANEL
ALT: 19 U/L (ref 0–35)
AST: 18 U/L (ref 0–37)
Albumin: 4.3 g/dL (ref 3.5–5.2)
Alkaline Phosphatase: 59 U/L (ref 39–117)
Bilirubin, Direct: 0.1 mg/dL (ref 0.0–0.3)
Total Bilirubin: 0.4 mg/dL (ref 0.2–1.2)
Total Protein: 7.3 g/dL (ref 6.0–8.3)

## 2020-12-11 LAB — T4, FREE: Free T4: 0.92 ng/dL (ref 0.60–1.60)

## 2020-12-11 LAB — VITAMIN B12: Vitamin B-12: 1082 pg/mL — ABNORMAL HIGH (ref 211–911)

## 2020-12-11 LAB — TSH: TSH: 1 u[IU]/mL (ref 0.35–5.50)

## 2020-12-11 NOTE — Progress Notes (Signed)
Chief Complaint  Patient presents with   Annual Exam     HPI: Stacey Young 65 y.o. comes in today for PV and  meds  BP ok on meds  controlled    MCI visual  deteriioration  lost insurance for 10 mos  to see [facility] every 1-2 years  .  Stopped driving when fear that would have an episode on visual distortion  perception   . Reading is impaired but auditory fine.  Physically  no new concerns    Health Maintenance  Topic Date Due   DEXA SCAN  Never done   COLONOSCOPY (Pts 45-13yrs Insurance coverage will need to be confirmed)  12/16/2019   COVID-19 Vaccine (5 - Booster for Pfizer series) 06/23/2020   MAMMOGRAM  03/25/2021   TETANUS/TDAP  08/02/2029   Pneumonia Vaccine 57+ Years old  Completed   INFLUENZA VACCINE  Completed   Hepatitis C Screening  Completed   Zoster Vaccines- Shingrix  Completed   HPV VACCINES  Aged Out   Health Maintenance Review LIFESTYLE:  Exercise:   4-5 mils per am and dog  Tobacco/ETS: no Alcohol:  1 per night  Sugar beverages: Sleep:  about 8 hours  Drug use: no HH: 2  dog  Neg fam hx colon cancer  and last colon no polyps  10 year recall  Hearing: ok Vision:  No limitations at present . Last eye check UTDbrain vision decreasing  reading a problem enlarging and  log words.  Safety:  Has smoke detector and wears seat belts.   No excess sun exposure. Sees dentist regularly. Depression: No anhedonia unusual crying or depressive symptoms Nutrition: Eats well balanced diet; adequate calcium and vitamin D. No swallowing chewing problems. Injury: no major injuries in the last six months. Other healthcare providers:  Reviewed today . Preventive parameters: up-to-date  Reviewed  ADLS:   There are no problems or need for physical assistance  but not driving . feeding, obtaining food, dressing, toileting and bathing, using phone. Is physically  independent. Nicole Kindred doing driving  and calculation finances and reading quickly .      ROS:  REST of 12 system  review negative except as per HPI   Past Medical History:  Diagnosis Date   Anemia    Back pain    Fibroids    uterine   Hay fever    Hx of colonoscopy    utd   Hx of transfusion    w gall baldder surgery  donates blood ( thus eg screen)   Hx of varicella    as a child   Hypertension    IBS (irritable bowel syndrome)    WBC decreased     Family History  Problem Relation Age of Onset   Arthritis Mother    Obstructive Sleep Apnea Mother        on CPAP   Cancer Father        bladder   Hyperlipidemia Father    Migraines Sister    Hyperlipidemia Sister        low wbc   Dementia Other        older aunt uncle gm 74s and 77s    Social History   Socioeconomic History   Marital status: Married    Spouse name: Not on file   Number of children: Not on file   Years of education: Not on file   Highest education level: Not on file  Occupational History   Not on file  Tobacco Use   Smoking status: Never   Smokeless tobacco: Never  Vaping Use   Vaping Use: Never used  Substance and Sexual Activity   Alcohol use: Yes    Alcohol/week: 14.0 standard drinks    Types: 14 Glasses of wine per week    Comment: "2 per night"    Drug use: No   Sexual activity: Not on file  Other Topics Concern   Not on file  Social History Narrative   Never Smoked   Alcohol use-yes about 2 per day red wine   Regular Exercise-yes   Drug use- no   hh of 2    Pet dog   Married education PhD   Education officer, museum a lot internationally 2 weeks out of each month about   orig from [place] Hx   Social Determinants of Health   Financial Resource Strain: Not on file  Food Insecurity: Not on file  Transportation Needs: Not on file  Physical Activity: Not on file  Stress: Not on file  Social Connections: Not on file    Outpatient Encounter Medications as of 12/11/2020  Medication Sig   donepezil (ARICEPT) 10 MG tablet Take 10 mg by mouth daily.    losartan-hydrochlorothiazide (HYZAAR) 100-25 MG tablet Take 1 tablet by mouth daily.   vitamin B-12 (CYANOCOBALAMIN) 100 MCG tablet Take 100 mcg by mouth daily.   XIIDRA 5 % SOLN INSTILL 1 DROP INTO BOTH EYES BID   [DISCONTINUED] COVID-19 mRNA vaccine, Pfizer, 30 MCG/0.3ML injection Inject into the muscle.   No facility-administered encounter medications on file as of 12/11/2020.    EXAM:  BP 122/60 (BP Location: Left Arm, Patient Position: Sitting, Cuff Size: Normal)   Pulse 61   Temp 97.8 F (36.6 C) (Oral)   Ht 5\' 4"  (1.626 m)   Wt 151 lb (68.5 kg)   SpO2 98%   BMI 25.92 kg/m   Body mass index is 25.92 kg/m.  Physical Exam: Vital signs reviewed MVH:QION is a well-developed well-nourished alert cooperative   who appears stated age in no acute distress.  HEENT: normocephalic atraumatic , Eyes: PERRL EOM's full, conjunctiva clear, Nares: masked tenderness., Ears: no EAC's clear TMs with normal landmarks. Mouth:masked  NECK: supple without masses, thyromegaly or bruits. CHEST/PULM:  Clear to auscultation and percussion breath sounds equal no wheeze , rales or rhonchi. No chest wall deformities or tenderness.Breast: normal by inspection . No dimpling, discharge, masses, tenderness or discharge . CV: PMI is nondisplaced, S1 S2 no gallops, murmurs, rubs. Peripheral pulses are full without delay.  ABDOMEN: Bowel sounds normal nontender  No guard or rebound, no hepato splenomegal no CVA tenderness.   Extremtities:  No clubbing cyanosis or edema, no acute joint swelling or redness no focal atrophy NEURO:  Oriented x3, cranial nerves 3-12 appear to be intact, no obvious focal weakness,gait within normal limits no abnormal reflexes or asymmetrical SKIN: No acute rashes normal turgor, color, no bruising or petechiae. PSYCH: Oriented, good eye contact, no obvious depression anxiety, cognition and judgment appear normal. LN: no cervical axillary inguinal adenopathy No noted deficits in  memory, attention, and speech.   Lab Results  Component Value Date   WBC 4.6 12/11/2020   HGB 13.7 12/11/2020   HCT 40.4 12/11/2020   PLT 231.0 12/11/2020   GLUCOSE 109 (H) 12/11/2020   CHOL 201 (H) 12/11/2020   TRIG 175.0 (H) 12/11/2020   HDL 67.20 12/11/2020   LDLCALC 99 12/11/2020  ALT 19 12/11/2020   AST 18 12/11/2020   NA 140 12/11/2020   K 3.6 12/11/2020   CL 103 12/11/2020   CREATININE 0.78 12/11/2020   BUN 19 12/11/2020   CO2 29 12/11/2020   TSH 1.00 12/11/2020   INR 1.0 04/26/2019   HGBA1C 5.7 12/11/2020   Non fasting   lunch  a few hours ago  ASSESSMENT AND PLAN:  Discussed the following assessment and plan:  Visit for preventive health examination  Medication management - Plan: Basic metabolic panel, CBC with Differential/Platelet, Hepatic function panel, Lipid panel, TSH, T4, free, Hemoglobin A1c, Vitamin B12, Vitamin B12, Hemoglobin A1c, T4, free, TSH, Lipid panel, Hepatic function panel, CBC with Differential/Platelet, Basic metabolic panel  Leukopenia, unspecified type - Plan: Basic metabolic panel, CBC with Differential/Platelet, Hepatic function panel, Lipid panel, TSH, T4, free, Hemoglobin A1c, Vitamin B12, Vitamin B12, Hemoglobin A1c, T4, free, TSH, Lipid panel, Hepatic function panel, CBC with Differential/Platelet, Basic metabolic panel  Essential hypertension - Plan: Basic metabolic panel, CBC with Differential/Platelet, Hepatic function panel, Lipid panel, TSH, T4, free, Hemoglobin A1c, Vitamin B12, Vitamin B12, Hemoglobin A1c, T4, free, TSH, Lipid panel, Hepatic function panel, CBC with Differential/Platelet, Basic metabolic panel  MCI (mild cognitive impairment) - visual  perceptin deficit  Vitamin B12 deficiency - Plan: Basic metabolic panel, CBC with Differential/Platelet, Hepatic function panel, Lipid panel, TSH, T4, free, Hemoglobin A1c, Vitamin B12, Vitamin B12, Hemoglobin A1c, T4, free, TSH, Lipid panel, Hepatic function panel, CBC with  Differential/Platelet, Basic metabolic panel  Colon cancer screening - Plan: Cologuard  Need for 23-polyvalent pneumococcal polysaccharide vaccine - Plan: Pneumococcal polysaccharide vaccine 23-valent greater than or equal to 2yo subcutaneous/IM  Patient Care Team: Burnis Medin, MD as PCP - General  Patient Instructions  Good to see you today.  Exam is good.  Will notify you  of labs when available.  Pneumovax 23 today .  Will order Cologuard colon cancer screening today .    Continue lifestyle intervention healthy eating and exercise . As possible  Health Maintenance, Female Adopting a healthy lifestyle and getting preventive care are important in promoting health and wellness. Ask your health care provider about: The right schedule for you to have regular tests and exams. Things you can do on your own to prevent diseases and keep yourself healthy. What should I know about diet, weight, and exercise? Eat a healthy diet  Eat a diet that includes plenty of vegetables, fruits, low-fat dairy products, and lean protein. Do not eat a lot of foods that are high in solid fats, added sugars, or sodium. Maintain a healthy weight Body mass index (BMI) is used to identify weight problems. It estimates body fat based on height and weight. Your health care provider can help determine your BMI and help you achieve or maintain a healthy weight. Get regular exercise Get regular exercise. This is one of the most important things you can do for your health. Most adults should: Exercise for at least 150 minutes each week. The exercise should increase your heart rate and make you sweat (moderate-intensity exercise). Do strengthening exercises at least twice a week. This is in addition to the moderate-intensity exercise. Spend less time sitting. Even light physical activity can be beneficial. Watch cholesterol and blood lipids Have your blood tested for lipids and cholesterol at 66 years of age,  then have this test every 5 years. Have your cholesterol levels checked more often if: Your lipid or cholesterol levels are high. You are older than  66 years of age. You are at high risk for heart disease. What should I know about cancer screening? Depending on your health history and family history, you may need to have cancer screening at various ages. This may include screening for: Breast cancer. Cervical cancer. Colorectal cancer. Skin cancer. Lung cancer. What should I know about heart disease, diabetes, and high blood pressure? Blood pressure and heart disease High blood pressure causes heart disease and increases the risk of stroke. This is more likely to develop in people who have high blood pressure readings or are overweight. Have your blood pressure checked: Every 3-5 years if you are 65-41 years of age. Every year if you are 66 years old or older. Diabetes Have regular diabetes screenings. This checks your fasting blood sugar level. Have the screening done: Once every three years after age 46 if you are at a normal weight and have a low risk for diabetes. More often and at a younger age if you are overweight or have a high risk for diabetes. What should I know about preventing infection? Hepatitis B If you have a higher risk for hepatitis B, you should be screened for this virus. Talk with your health care provider to find out if you are at risk for hepatitis B infection. Hepatitis C Testing is recommended for: Everyone born from [birth year] through 1965. Anyone with known risk factors for hepatitis C. Sexually transmitted infections (STIs) Get screened for STIs, including gonorrhea and chlamydia, if: You are sexually active and are younger than 66 years of age. You are older than 66 years of age and your health care provider tells you that you are at risk for this type of infection. Your sexual activity has changed since you were last screened, and you are at increased risk  for chlamydia or gonorrhea. Ask your health care provider if you are at risk. Ask your health care provider about whether you are at high risk for HIV. Your health care provider may recommend a prescription medicine to help prevent HIV infection. If you choose to take medicine to prevent HIV, you should first get tested for HIV. You should then be tested every 3 months for as long as you are taking the medicine. Pregnancy If you are about to stop having your period (premenopausal) and you may become pregnant, seek counseling before you get pregnant. Take 400 to 800 micrograms (mcg) of folic acid every day if you become pregnant. Ask for birth control (contraception) if you want to prevent pregnancy. Osteoporosis and menopause Osteoporosis is a disease in which the bones lose minerals and strength with aging. This can result in bone fractures. If you are 33 years old or older, or if you are at risk for osteoporosis and fractures, ask your health care provider if you should: Be screened for bone loss. Take a calcium or vitamin D supplement to lower your risk of fractures. Be given hormone replacement therapy (HRT) to treat symptoms of menopause. Follow these instructions at home: Alcohol use Do not drink alcohol if: Your health care provider tells you not to drink. You are pregnant, may be pregnant, or are planning to become pregnant. If you drink alcohol: Limit how much you have to: 0-1 drink a day. Know how much alcohol is in your drink. In the [place] one drink equals one 12 oz bottle of beer (355 mL), one 5 oz glass of wine (148 mL), or one 1 oz glass of hard liquor (44 mL). Lifestyle Do not use  any products that contain nicotine or tobacco. These products include cigarettes, chewing tobacco, and vaping devices, such as e-cigarettes. If you need help quitting, ask your health care provider. Do not use street drugs. Do not share needles. Ask your health care provider for help if you need  support or information about quitting drugs. General instructions Schedule regular health, dental, and eye exams. Stay current with your vaccines. Tell your health care provider if: You often feel depressed. You have ever been abused or do not feel safe at home. Summary Adopting a healthy lifestyle and getting preventive care are important in promoting health and wellness. Follow your health care provider's instructions about healthy diet, exercising, and getting tested or screened for diseases. Follow your health care provider's instructions on monitoring your cholesterol and blood pressure. This information is not intended to replace advice given to you by your health care provider. Make sure you discuss any questions you have with your health care provider. Document Revised: 05/22/2020 Document Reviewed: 05/22/2020 Elsevier Patient Education  2022 [place] Kelin Nixon M.D.

## 2020-12-11 NOTE — Patient Instructions (Signed)
Good to see you today.  Exam is good.  Will notify you  of labs when available.  Pneumovax 23 today .  Will order Cologuard colon cancer screening today .    Continue lifestyle intervention healthy eating and exercise . As possible  Health Maintenance, Female Adopting a healthy lifestyle and getting preventive care are important in promoting health and wellness. Ask your health care provider about: The right schedule for you to have regular tests and exams. Things you can do on your own to prevent diseases and keep yourself healthy. What should I know about diet, weight, and exercise? Eat a healthy diet  Eat a diet that includes plenty of vegetables, fruits, low-fat dairy products, and lean protein. Do not eat a lot of foods that are high in solid fats, added sugars, or sodium. Maintain a healthy weight Body mass index (BMI) is used to identify weight problems. It estimates body fat based on height and weight. Your health care provider can help determine your BMI and help you achieve or maintain a healthy weight. Get regular exercise Get regular exercise. This is one of the most important things you can do for your health. Most adults should: Exercise for at least 150 minutes each week. The exercise should increase your heart rate and make you sweat (moderate-intensity exercise). Do strengthening exercises at least twice a week. This is in addition to the moderate-intensity exercise. Spend less time sitting. Even light physical activity can be beneficial. Watch cholesterol and blood lipids Have your blood tested for lipids and cholesterol at 66 years of age, then have this test every 5 years. Have your cholesterol levels checked more often if: Your lipid or cholesterol levels are high. You are older than 66 years of age. You are at high risk for heart disease. What should I know about cancer screening? Depending on your health history and family history, you may need to have cancer  screening at various ages. This may include screening for: Breast cancer. Cervical cancer. Colorectal cancer. Skin cancer. Lung cancer. What should I know about heart disease, diabetes, and high blood pressure? Blood pressure and heart disease High blood pressure causes heart disease and increases the risk of stroke. This is more likely to develop in people who have high blood pressure readings or are overweight. Have your blood pressure checked: Every 3-5 years if you are 23-2 years of age. Every year if you are 5 years old or older. Diabetes Have regular diabetes screenings. This checks your fasting blood sugar level. Have the screening done: Once every three years after age 16 if you are at a normal weight and have a low risk for diabetes. More often and at a younger age if you are overweight or have a high risk for diabetes. What should I know about preventing infection? Hepatitis B If you have a higher risk for hepatitis B, you should be screened for this virus. Talk with your health care provider to find out if you are at risk for hepatitis B infection. Hepatitis C Testing is recommended for: Everyone born from [birth year] through 1965. Anyone with known risk factors for hepatitis C. Sexually transmitted infections (STIs) Get screened for STIs, including gonorrhea and chlamydia, if: You are sexually active and are younger than 66 years of age. You are older than 66 years of age and your health care provider tells you that you are at risk for this type of infection. Your sexual activity has changed since you were last screened, and  you are at increased risk for chlamydia or gonorrhea. Ask your health care provider if you are at risk. Ask your health care provider about whether you are at high risk for HIV. Your health care provider may recommend a prescription medicine to help prevent HIV infection. If you choose to take medicine to prevent HIV, you should first get tested for HIV. You  should then be tested every 3 months for as long as you are taking the medicine. Pregnancy If you are about to stop having your period (premenopausal) and you may become pregnant, seek counseling before you get pregnant. Take 400 to 800 micrograms (mcg) of folic acid every day if you become pregnant. Ask for birth control (contraception) if you want to prevent pregnancy. Osteoporosis and menopause Osteoporosis is a disease in which the bones lose minerals and strength with aging. This can result in bone fractures. If you are 25 years old or older, or if you are at risk for osteoporosis and fractures, ask your health care provider if you should: Be screened for bone loss. Take a calcium or vitamin D supplement to lower your risk of fractures. Be given hormone replacement therapy (HRT) to treat symptoms of menopause. Follow these instructions at home: Alcohol use Do not drink alcohol if: Your health care provider tells you not to drink. You are pregnant, may be pregnant, or are planning to become pregnant. If you drink alcohol: Limit how much you have to: 0-1 drink a day. Know how much alcohol is in your drink. In the [place] one drink equals one 12 oz bottle of beer (355 mL), one 5 oz glass of wine (148 mL), or one 1 oz glass of hard liquor (44 mL). Lifestyle Do not use any products that contain nicotine or tobacco. These products include cigarettes, chewing tobacco, and vaping devices, such as e-cigarettes. If you need help quitting, ask your health care provider. Do not use street drugs. Do not share needles. Ask your health care provider for help if you need support or information about quitting drugs. General instructions Schedule regular health, dental, and eye exams. Stay current with your vaccines. Tell your health care provider if: You often feel depressed. You have ever been abused or do not feel safe at home. Summary Adopting a healthy lifestyle and getting preventive care are  important in promoting health and wellness. Follow your health care provider's instructions about healthy diet, exercising, and getting tested or screened for diseases. Follow your health care provider's instructions on monitoring your cholesterol and blood pressure. This information is not intended to replace advice given to you by your health care provider. Make sure you discuss any questions you have with your health care provider. Document Revised: 05/22/2020 Document Reviewed: 05/22/2020 Elsevier Patient Education  [place].

## 2020-12-12 NOTE — Progress Notes (Signed)
Blood results stable blood sugar borderline but no diabetes.

## 2020-12-14 ENCOUNTER — Ambulatory Visit
Admission: RE | Admit: 2020-12-14 | Discharge: 2020-12-14 | Disposition: A | Discharge status: Home / Self Care | Payer: PPO | Source: Ambulatory Visit | Attending: Internal Medicine | Admitting: Internal Medicine

## 2020-12-14 DIAGNOSIS — Z1231 Encounter for screening mammogram for malignant neoplasm of breast: Secondary | ICD-10-CM

## 2020-12-14 NOTE — Progress Notes (Signed)
List avoiding simple sugars processed carbohydrates white rice white bread continue exercise as possible and we will just follow.  At this time I do not recommend medication.

## 2020-12-21 ENCOUNTER — Ambulatory Visit (HOSPITAL_BASED_OUTPATIENT_CLINIC_OR_DEPARTMENT_OTHER): Payer: Self-pay | Admitting: Physical Therapy

## 2020-12-25 ENCOUNTER — Ambulatory Visit: Payer: PPO | Admitting: Family Medicine

## 2020-12-25 NOTE — Progress Notes (Deleted)
   I, Peterson Lombard, LAT, ATC acting as a scribe for Lynne Leader, MD.  Stacey Young is a 65 y.o. female who presents to [facility] today for f/u chronic L shoulder pain. Pt is R-hand dominate. MOI: Pt suffered a fall when going down the garage steps, missing the bottom 2 steps, falling forward, landing on her L shoulder.Pt was last seen by Dr. Georgina Snell on 11/14/20 and was referred to PT, completing 1 visit. Today, pt reports  Dx imaging: 11/14/20 L shoulder XR  Pertinent review of systems: ***  Relevant historical information: ***   Exam:  There were no vitals taken for this visit. General: Well Developed, well nourished, and in no acute distress.   MSK: ***    Lab and Radiology Results No results found for this or any previous visit (from the past 72 hour(s)). No results found.     Assessment and Plan: 65 y.o. female with ***   PDMP not reviewed this encounter. No orders of the defined types were placed in this encounter.  No orders of the defined types were placed in this encounter.    Discussed warning signs or symptoms. Please see discharge instructions. Patient expresses understanding.   ***

## 2021-01-10 ENCOUNTER — Other Ambulatory Visit: Payer: Self-pay

## 2021-01-10 ENCOUNTER — Ambulatory Visit: Payer: PPO | Admitting: Family Medicine

## 2021-01-10 VITALS — BP 118/76 | HR 58 | Ht 64.0 in | Wt 151.4 lb

## 2021-01-10 DIAGNOSIS — G8929 Other chronic pain: Secondary | ICD-10-CM | POA: Diagnosis not present

## 2021-01-10 DIAGNOSIS — M25512 Pain in left shoulder: Secondary | ICD-10-CM | POA: Diagnosis not present

## 2021-01-10 NOTE — Progress Notes (Signed)
° °  I, Peterson Lombard, LAT, ATC acting as a scribe for Lynne Leader, MD.  Stacey Young is a 65 y.o. female who presents to [place] at [facility] today for f/u chronic L shoulder pain. Pt is R-hand dominate. Pt was last seen by Dr. Georgina Snell on 11/14/20 and was referred to PT, completing 1 visit. Today, pt reports L shoulder pain is about the same. Pt has been working on the HEP, but will notes pain is varying. Pt states increased pain trying to remove her shirt.  She has been compliant with home exercise program doing them at least daily for at least 20 minutes.  Home exercise program has been ongoing since at least her last visit with me on November 14, 2020.  Pain has been ongoing for about 5 months now.  Dx imaging: 11/14/20 L shoulder XR  Pertinent review of systems: No fevers or chills  Relevant historical information: Hypertension.   Exam:  BP 118/76    Pulse (!) 58    Ht 5\' 4"  (1.626 m)    Wt 151 lb 6.4 oz (68.7 kg)    SpO2 99%    BMI 25.99 kg/m  General: Well Developed, well nourished, and in no acute distress.   MSK: Left shoulder normal-appearing Range of motion abduction 130 degrees.  Pain with abduction.    Lab and Radiology Results  EXAM: LEFT SHOULDER - 2+ VIEW   COMPARISON:  Chest x-ray 02/22/2019.   FINDINGS: Acromioclavicular and glenohumeral degenerative change. No acute bony abnormality. No evidence of fracture or dislocation. No evidence of separation.   IMPRESSION: Acromioclavicular glenohumeral degenerative change. No acute abnormality.     Electronically Signed   By: Marcello Moores  Register M.D.   On: 11/15/2020 07:17  I, Lynne Leader, personally (independently) visualized and performed the interpretation of the images attached in this note.     Assessment and Plan: 65 y.o. female with left shoulder pain thought to be due to impingement or bursitis/rotator cuff tendinitis.  Doubtful that the arthritis is much of a component of her pain.   However she has not improved with that I consider reasonable trial of conservative management.  Discussed options.  Plan to proceed with MRI to further characterize source of pain and for either surgical or injection planning.  Recheck after MRI.   PDMP not reviewed this encounter. Orders Placed This Encounter  Procedures   MR SHOULDER LEFT WO CONTRAST    Standing Status:   Future    Standing Expiration Date:   01/10/2022    Order Specific Question:   What is the patient's sedation requirement?    Answer:   No Sedation    Order Specific Question:   Does the patient have a pacemaker or implanted devices?    Answer:   No    Order Specific Question:   Preferred imaging location?    Answer:   [facility] (table limit-550lbs)   No orders of the defined types were placed in this encounter.    Discussed warning signs or symptoms. Please see discharge instructions. Patient expresses understanding.   The above documentation has been reviewed and is accurate and complete Lynne Leader, M.D.  Total encounter time 20 minutes including face-to-face time with the patient and, reviewing past medical record, and charting on the date of service.   Plan and option discussion.

## 2021-01-10 NOTE — Patient Instructions (Addendum)
Thank you for coming in today.  ° °You should hear from MRI scheduling within 1 week. If you do not hear please let me know.   ° °Recheck back after MRI °

## 2021-01-15 DIAGNOSIS — Z1211 Encounter for screening for malignant neoplasm of colon: Secondary | ICD-10-CM | POA: Diagnosis not present

## 2021-01-21 LAB — COLOGUARD: COLOGUARD: NEGATIVE

## 2021-02-07 ENCOUNTER — Other Ambulatory Visit: Payer: Self-pay | Admitting: Internal Medicine

## 2021-02-08 ENCOUNTER — Other Ambulatory Visit: Payer: Self-pay

## 2021-02-08 ENCOUNTER — Ambulatory Visit
Admission: RE | Admit: 2021-02-08 | Discharge: 2021-02-08 | Disposition: A | Discharge status: Home / Self Care | Payer: PPO | Source: Ambulatory Visit | Attending: Family Medicine | Admitting: Family Medicine

## 2021-02-08 DIAGNOSIS — M7552 Bursitis of left shoulder: Secondary | ICD-10-CM | POA: Diagnosis not present

## 2021-02-08 DIAGNOSIS — M25512 Pain in left shoulder: Secondary | ICD-10-CM

## 2021-02-08 DIAGNOSIS — G8929 Other chronic pain: Secondary | ICD-10-CM

## 2021-02-12 NOTE — Progress Notes (Signed)
Left shoulder MRI shows tendinitis of 2 of the rotator cuff tendons along with some shoulder bursitis.  This is very likely the source of your pain.  Would recommend potential injection as next step.  Recommend return to clinic to go over the results of the MRI in full detail and potentially proceed to injection.

## 2021-02-14 ENCOUNTER — Other Ambulatory Visit: Payer: Self-pay

## 2021-02-14 ENCOUNTER — Ambulatory Visit: Payer: Self-pay

## 2021-02-14 ENCOUNTER — Ambulatory Visit: Payer: PPO | Admitting: Family Medicine

## 2021-02-14 VITALS — BP 120/68 | HR 57 | Ht 64.0 in

## 2021-02-14 DIAGNOSIS — M25512 Pain in left shoulder: Secondary | ICD-10-CM

## 2021-02-14 DIAGNOSIS — M7552 Bursitis of left shoulder: Secondary | ICD-10-CM | POA: Diagnosis not present

## 2021-02-14 DIAGNOSIS — G8929 Other chronic pain: Secondary | ICD-10-CM | POA: Diagnosis not present

## 2021-02-14 NOTE — Patient Instructions (Addendum)
Good to see you   Injection given in left shoulder today   Loose body or Plica in the knee?  Follow up as needed.

## 2021-02-14 NOTE — Progress Notes (Signed)
I, Peterson Lombard, LAT, ATC acting as a scribe for Lynne Leader, MD.  Stacey Young is a 66 y.o. female who presents to [place] at [facility] today for f/u L shoulder pain and MRI review. Pt is R-hand dominate. Pt has been working on HEP since 11/22. Pt was last seen by Dr. Georgina Snell on 01/10/21 and was advised to proceed to MRI. Today, pt reports the pain is the same but some days are better than others.   Dx imaging: 02/08/21 L shoulder MRI 11/14/20 L shoulder XR  Pertinent review of systems: No fevers or chills  Relevant historical information: Hypertension   Exam:  BP 120/68    Pulse (!) 57    Ht 5\' 4"  (1.626 m)    SpO2 98%    BMI 25.99 kg/m  General: Well Developed, well nourished, and in no acute distress.   MSK: Left shoulder normal appearing. Pain with abduction. Positive Hawkins and Neer's test.    Lab and Radiology Results  Procedure: Real-time Ultrasound Guided Injection of left shoulder subacromial bursa Device: Philips Affiniti 50G Images permanently stored and available for review in PACS Verbal informed consent obtained.  Discussed risks and benefits of procedure. Warned about infection bleeding damage to structures skin hypopigmentation and fat atrophy among others. Patient expresses understanding and agreement Time-out conducted.   Noted no overlying erythema, induration, or other signs of local infection.   Skin prepped in a sterile fashion.   Local anesthesia: Topical Ethyl chloride.   With sterile technique and under real time ultrasound guidance: 40 mg of Kenalog and 2 mL of Marcaine injected into subacromial bursa. Fluid seen entering the bursa.   Completed without difficulty   Pain immediately resolved suggesting accurate placement of the medication.   Advised to call if fevers/chills, erythema, induration, drainage, or persistent bleeding.   Images permanently stored and available for review in the ultrasound unit.  Impression:  Technically successful ultrasound guided injection.    EXAM: MRI OF THE LEFT SHOULDER WITHOUT CONTRAST   TECHNIQUE: Multiplanar, multisequence MR imaging of the shoulder was performed. No intravenous contrast was administered.   COMPARISON:  X-ray shoulder 11/14/2020.   FINDINGS: Rotator cuff: Mild tendinosis of the supraspinatus and infraspinatus tendons. Teres minor tendon is intact. Subscapularis tendon is intact.   Muscles: No muscle atrophy or edema. No intramuscular fluid collection or hematoma.   Biceps Long Head: Intraarticular and extraarticular portions of the biceps tendon are intact.   Acromioclavicular Joint: Moderate arthropathy of the acromioclavicular joint. Small amount of subacromial/subdeltoid bursal fluid.   Glenohumeral Joint: No joint effusion. No chondral defect.   Labrum: Grossly intact, but evaluation is limited by lack of intraarticular fluid/contrast.   Bones: No fracture or dislocation. No aggressive osseous lesion.   Other: No fluid collection or hematoma.   IMPRESSION: 1. Mild tendinosis of the supraspinatus and infraspinatus tendons. 2. Mild subacromial/subdeltoid bursitis.     Electronically Signed   By: Kathreen Devoid M.D.   On: 02/09/2021 15:54 I, Lynne Leader, personally (independently) visualized and performed the interpretation of the images attached in this note.      Assessment and Plan: 66 y.o. female with left shoulder pain thought to be due to rotator cuff tendinopathy and subacromial bursitis.  Plan for steroid injection today and continued home exercise program.  Recheck back as needed. Reviewed MRI findings in full detail and discuss treatment plan and options going forward. Time-based billing today excludes time perform the injection. Total encounter time  20 minutes including face-to-face time with the patient and, reviewing past medical record, and charting on the date of service.      PDMP not reviewed this  encounter. Orders Placed This Encounter  Procedures   [place] LIMITED JOINT SPACE STRUCTURES UP LEFT(NO LINKED CHARGES)    Standing Status:   Future    Number of Occurrences:   1    Standing Expiration Date:   02/14/2022    Order Specific Question:   Reason for Exam (SYMPTOM  OR DIAGNOSIS REQUIRED)    Answer:   Left shoulder pain    Order Specific Question:   Preferred imaging location?    Answer:   [facility]   No orders of the defined types were placed in this encounter.    Discussed warning signs or symptoms. Please see discharge instructions. Patient expresses understanding.   The above documentation has been reviewed and is accurate and complete Lynne Leader, M.D.

## 2021-03-07 DIAGNOSIS — F411 Generalized anxiety disorder: Secondary | ICD-10-CM | POA: Diagnosis not present

## 2021-05-08 ENCOUNTER — Telehealth: Payer: Self-pay | Admitting: Internal Medicine

## 2021-05-08 NOTE — Telephone Encounter (Signed)
If getting infected  soak warm water and or compresses  dont do any infection   can use topical antibiotic ointment   can send in a picture  on my chart.

## 2021-05-08 NOTE — Telephone Encounter (Signed)
Spoke with patient and informed her of message, she stated the bump or boil is the size of little pinky and it's firm filled with liquid it's been four days and doesn't appear to be going anywhere.  I asked if a pic could be sent and she stated she would try to send one. ?

## 2021-05-08 NOTE — Telephone Encounter (Signed)
Pt called stating she was bitten by a bug a few days ago and now she has a small boil on her foot. She is wondering if she can inject it with Hydroperoxide or if Dr. 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  ? ?I suggested scheduling a VV or in person appt but pt stated it's not necessary and she just wanted some suggestions from the doc.  ? ?Please advise.   ?

## 2021-05-08 NOTE — Telephone Encounter (Signed)
Patient called back and I repeated the advice Dr. Regis Bill advised patient stated she will call back in the morning to inform how foot is doing ?

## 2021-05-08 NOTE — Telephone Encounter (Signed)
No other advice  coming without seeing  but less is best

## 2021-05-08 NOTE — Telephone Encounter (Signed)
Last Ov 12/11/20 ?Please advise ?

## 2021-05-09 NOTE — Telephone Encounter (Signed)
Noted  

## 2021-05-10 DIAGNOSIS — S90862A Insect bite (nonvenomous), left foot, initial encounter: Secondary | ICD-10-CM | POA: Diagnosis not present

## 2021-05-28 ENCOUNTER — Telehealth: Payer: Self-pay | Admitting: Internal Medicine

## 2021-05-28 NOTE — Telephone Encounter (Signed)
Left message for patient to call back and schedule Medicare Annual Wellness Visit (AWV) either virtually or in office. Left  my jabber number [phone]   awvi 05/14/21 per [place] ; please schedule at anytime with [facility] Nurse Health Advisor 1 or 2   This should be a 45 minute visit.  

## 2021-06-04 ENCOUNTER — Telehealth: Payer: Self-pay | Admitting: Internal Medicine

## 2021-06-04 NOTE — Telephone Encounter (Signed)
FYI

## 2021-06-04 NOTE — Telephone Encounter (Signed)
FYI Pt is calling and report she tested positive for covid on 06-03-2021. Pt does not want to make an appt nor talk with triage nurse she is calling just to report. Pt has no taste and just stuffy nose

## 2021-06-12 ENCOUNTER — Ambulatory Visit (INDEPENDENT_AMBULATORY_CARE_PROVIDER_SITE_OTHER): Payer: PPO

## 2021-06-12 DIAGNOSIS — Z0000 Encounter for general adult medical examination without abnormal findings: Secondary | ICD-10-CM | POA: Diagnosis not present

## 2021-06-12 NOTE — Progress Notes (Signed)
Subjective:   Stacey Young is a 66 y.o. female who presents for an Initial Medicare Annual Wellness Visit.  Virtual Visit via Video Note  I connected with  Stacey Young on 06/12/21 at  9:45 AM EDT via telehealth video enabled device and verified that I am speaking with the correct person using two identifiers.  Location: Patient: home Provider: office Persons participating in the virtual visit: patient/Nurse Health Advisor   I discussed the limitations, risks, security and privacy concerns of performing an evaluation and management service by telephone and the availability of in person appointments. The patient expressed understanding and agreed to proceed.  Some vital signs may be absent or patient reported.   Clemetine Marker, LPN   Review of Systems     Cardiac Risk Factors include: advanced age (>28mn, >>17women);hypertension     Objective:    There were no vitals filed for this visit. There is no height or weight on file to calculate BMI.     06/12/2021    9:55 AM 12/05/2020    4:51 PM 02/22/2019    8:15 AM 04/26/2016    3:19 PM 03/26/2016    1:14 PM 08/29/2014    4:58 PM  Advanced Directives  Does Patient Have a Medical Advance Directive? No No No No No No  Would patient like information on creating a medical advance directive? Yes (MAU/Ambulatory/Procedural Areas - Information given) Yes (MAU/Ambulatory/Procedural Areas - Information given) No - Patient declined   No - patient declined information    Current Medications (verified) Outpatient Encounter Medications as of 06/12/2021  Medication Sig   donepezil (ARICEPT) 10 MG tablet Take 10 mg by mouth daily.   losartan-hydrochlorothiazide (HYZAAR) 100-25 MG tablet TAKE 1 TABLET BY MOUTH DAILY   vitamin B-12 (CYANOCOBALAMIN) 100 MCG tablet Take 100 mcg by mouth daily.   [DISCONTINUED] XIIDRA 5 % SOLN INSTILL 1 DROP INTO BOTH EYES BID   No facility-administered encounter medications on file as of 06/12/2021.     Allergies (verified) Patient has no known allergies.   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   for fibroids and endometriosis   Family History  Problem Relation Age of Onset   Arthritis Mother    Obstructive Sleep Apnea Mother        on CPAP   Cancer Father        bladder   Hyperlipidemia Father    Migraines Sister    Hyperlipidemia Sister        low wbc   Dementia Other        older aunt uncle gm [id number]  Social History   Socioeconomic History   Marital status: Married    Spouse name: Not on file   Number of children: Not on file   Years of education: Not on file   Highest education  level: Not on file  Occupational History   Not on file  Tobacco Use   Smoking status: Never   Smokeless tobacco: Never  Vaping Use   Vaping Use: Never used  Substance and Sexual Activity   Alcohol use: Yes    Alcohol/week: 1.0 standard drink    Types: 1 Glasses of wine per week   Drug use: No   Sexual activity: Not on file  Other Topics Concern   Not on file  Social History Narrative   Never Smoked   Alcohol use-yes about 2 per day red wine   Regular Exercise-yes   Drug use- no   hh of 2    Pet dog   Married education PhD   Education officer, museum a lot internationally 2 weeks out of each month about   orig from [place] Hx   Social Determinants of Health   Financial Resource Strain: Low Risk    Difficulty of Paying Living Expenses: Not hard at all  Food Insecurity: No Food Insecurity    Worried About Charity fundraiser in the Last Year: Never true   Arboriculturist in the Last Year: Never true  Transportation Needs: No Transportation Needs   Lack of Transportation (Medical): No   Lack of Transportation (Non-Medical): No  Physical Activity: Sufficiently Active   Days of Exercise per Week: 7 days   Minutes of Exercise per Session: 60 min  Stress: No Stress Concern Present   Feeling of Stress : Not at all  Social Connections: Moderately Isolated   Frequency of Communication with Friends and Family: More than three times a week   Frequency of Social Gatherings with Friends and Family: Twice a week   Attends Religious Services: Never   Marine scientist or Organizations: No   Attends Music therapist: Never   Marital Status: Married    Tobacco Counseling Counseling given: Not Answered   Clinical Intake:  Pre-visit preparation completed: Yes  Pain : No/denies pain     Nutritional Risks: None Diabetes: No  How often do you need to have someone help you when you read instructions, pamphlets, or other written materials from your doctor or pharmacy?: 1 - Never    Interpreter Needed?: No  Information entered by :: Clemetine Marker LPN   Activities of Daily Living    06/12/2021    9:57 AM  In your present state of health, do you have any difficulty performing the following activities:  Hearing? 0  Vision? 0  Difficulty concentrating or making decisions? 0  Walking or climbing stairs? 0  Dressing or bathing? 0  Doing errands, shopping? 0  Preparing Food and eating ? N  Using the Toilet? N  In the past six months, have you accidently leaked urine? N  Do you have problems with loss of bowel control? N  Managing your Medications? N  Managing your Finances? N  Housekeeping or managing your Housekeeping? N    Patient Care Team: Panosh, Standley Brooking, MD as PCP - General  Indicate any recent Medical Services you may have received from other  than [facility] providers in the past year (date may be approximate).     Assessment:   This is a routine wellness examination for Stacey Young.  Hearing/Vision screen Hearing Screening - Comments:: Pt denies hearing difficulty Vision Screening - Comments:: Past due for eye exam; established with [facility]  Dietary issues and exercise activities discussed: Current Exercise  Habits: Home exercise routine, Type of exercise: walking, Time (Minutes): 60, Frequency (Times/Week): 7, Weekly Exercise (Minutes/Week): 420, Intensity: Moderate, Exercise limited by: None identified   Goals Addressed   None    Depression Screen    06/12/2021    9:53 AM 12/11/2020    1:44 PM 12/07/2019    9:53 AM 12/07/2019    8:36 AM 11/30/2018    3:54 PM 11/14/2017    8:35 AM 11/12/2016    2:16 PM  PHQ 2/9 Scores  PHQ - 2 Score 0 0 0 0 0 0 0  PHQ- 9 Score   0        Fall Risk    06/12/2021    9:57 AM 12/11/2020    1:44 PM 12/07/2019    8:36 AM 12/09/2012    7:29 PM  Fall Risk   Falls in the past year? 0 1 0 No  Number falls in past yr: 0 0    Injury with Fall? 0 1    Risk for fall due to : No Fall Risks     Follow up Falls prevention discussed       [place]:  Any stairs in or around the home? Yes  If so, are there any without handrails? No  Home free of loose throw rugs in walkways, pet beds, electrical cords, etc? Yes  Adequate lighting in your home to reduce risk of falls? Yes   ASSISTIVE DEVICES UTILIZED TO PREVENT FALLS:  Life alert? No  Use of a cane, walker or w/c? No  Grab bars in the bathroom? No  Shower chair or bench in shower? Yes  Elevated toilet seat or a handicapped toilet? No   TIMED UP AND GO:  Was the test performed? No . Telephonic visit.   Cognitive Function: Cognitive status assessed by direct observation. Patient has current diagnosis of mild cognitive impairment. Patient is followed by PCP for ongoing assessment.         Immunizations Immunization History  Administered Date(s) Administered   Fluad Quad(high Dose 65+) 10/11/2020   Influenza Inj Mdck Quad Pf 10/06/2018   Influenza Split 12/04/2010, 10/29/2011, 10/19/2012, 10/09/2013   Influenza,inj,Quad PF,6+ Mos 09/21/2014, 10/23/2015, 09/24/2016, 11/14/2017   Influenza-Unspecified 09/24/2016, 09/12/2019   PFIZER Comirnaty(Gray Top)Covid-19 Tri-Sucrose Vaccine 04/28/2020   PFIZER(Purple Top)SARS-COV-2 Vaccination 04/26/2019, 05/30/2019, 10/06/2019   Pfizer Covid-19 Vaccine Bivalent Booster 42yr & up 05/14/2021   Pneumococcal Conjugate-13 12/07/2019   Pneumococcal Polysaccharide-23 12/11/2020   Td 11/13/2009   Tdap 08/03/2019   Zoster Recombinat (Shingrix) 09/24/2016, 11/28/2016   Zoster, Live 06/14/2014    TDAP status: Up to date  Flu Vaccine status: Up to date  Pneumococcal vaccine status: Up to date  Covid-19 vaccine status: Completed vaccines  Qualifies for Shingles Vaccine? Yes   Zostavax completed Yes   Shingrix Completed?: Yes  Screening Tests Health Maintenance  Topic Date Due   DEXA SCAN  Never done   INFLUENZA VACCINE  08/14/2021   MAMMOGRAM  12/15/2022   Fecal DNA (Cologuard)  01/16/2024   TETANUS/TDAP  08/02/2029   Pneumonia Vaccine 66 Years old  Completed   COVID-19 Vaccine  Completed   Hepatitis C Screening  Completed   Zoster Vaccines- Shingrix  Completed   HPV VACCINES  Aged Out   COLONOSCOPY (Pts 45-425yrInsurance coverage will need to be confirmed)  Discontinued    Health Maintenance  Health Maintenance Due  Topic Date Due   DEXA SCAN  Never done  Colorectal cancer screening: Type of screening: Cologuard. Completed 01/21/21. Repeat every 3 years  Mammogram status: Completed 12/14/20. Repeat every year  Bone density status: due; pt to discuss with PCP at next office visit.   Lung Cancer Screening: (Low Dose CT Chest recommended if Age 36-80 years, 30 pack-year currently smoking OR have quit w/in  15years.) does not qualify.   Additional Screening:  Hepatitis C Screening: does qualify; Completed 11/30/18  Vision Screening: Recommended annual ophthalmology exams for early detection of glaucoma and other disorders of the eye. Is the patient up to date with their annual eye exam?  No  Who is the provider or what is the name of the office in which the patient attends annual eye exams? [facility].   Dental Screening: Recommended annual dental exams for proper oral hygiene  Community Resource Referral / Chronic Care Management: CRR required this visit?  No   CCM required this visit?  No      Plan:     I have personally reviewed and noted the following in the patient's chart:   Medical and social history Use of alcohol, tobacco or illicit drugs  Current medications and supplements including opioid prescriptions. Patient is not currently taking opioid prescriptions. Functional ability and status Nutritional status Physical activity Advanced directives List of other physicians Hospitalizations, surgeries, and ER visits in previous 12 months Vitals Screenings to include cognitive, depression, and falls Referrals and appointments  In addition, I have reviewed and discussed with patient certain preventive protocols, quality metrics, and best practice recommendations. A written personalized care plan for preventive services as well as general preventive health recommendations were provided to patient.     Clemetine Marker, LPN   [id number]/83/[id number]   Nurse Notes: none

## 2021-06-12 NOTE — Patient Instructions (Signed)
Stacey Young , Thank you for taking time to come for your Medicare Wellness Visit. I appreciate your ongoing commitment to your health goals. Please review the following plan we discussed and let me know if I can assist you in the future.   Screening recommendations/referrals: Colonoscopy: Cologuard done 01/21/21. Repeat 01/2024 Mammogram: done 12/14/20 Bone Density: due; please discuss with Dr. Regis Bill at your next office visit.  Recommended yearly ophthalmology/optometry visit for glaucoma screening and checkup Recommended yearly dental visit for hygiene and checkup  Vaccinations: Influenza vaccine: done 10/11/20 Pneumococcal vaccine: dopne 12/11/20 Tdap vaccine: done 08/03/19 Shingles vaccine: done 09/24/16 & 11/28/16   Covid-19:done 04/26/19, 05/30/19, 10/06/19, 04/28/20 & 05/14/21  Advanced directives: Advance directive discussed with you today.You may pick up a copy from our office for you to complete at home and have notarized. Once this is complete please bring a copy in to our office so we can scan it into your chart.   Conditions/risks identified: Keep up the great work!  Next appointment: Follow up in one year for your annual wellness visit    Preventive Care 65 Years and Older, Female Preventive care refers to lifestyle choices and visits with your health care provider that can promote health and wellness. What does preventive care include? A yearly physical exam. This is also called an annual well check. Dental exams once or twice a year. Routine eye exams. Ask your health care provider how often you should have your eyes checked. Personal lifestyle choices, including: Daily care of your teeth and gums. Regular physical activity. Eating a healthy diet. Avoiding tobacco and drug use. Limiting alcohol use. Practicing safe sex. Taking low-dose aspirin every day. Taking vitamin and mineral supplements as recommended by your health care provider. What happens during an annual well  check? The services and screenings done by your health care provider during your annual well check will depend on your age, overall health, lifestyle risk factors, and family history of disease. Counseling  Your health care provider may ask you questions about your: Alcohol use. Tobacco use. Drug use. Emotional well-being. Home and relationship well-being. Sexual activity. Eating habits. History of falls. Memory and ability to understand (cognition). Work and work Statistician. Reproductive health. Screening  You may have the following tests or measurements: Height, weight, and BMI. Blood pressure. Lipid and cholesterol levels. These may be checked every 5 years, or more frequently if you are over 1 years old. Skin check. Lung cancer screening. You may have this screening every year starting at age 34 if you have a 30-pack-year history of smoking and currently smoke or have quit within the past 15 years. Fecal occult blood test (FOBT) of the stool. You may have this test every year starting at age 60. Flexible sigmoidoscopy or colonoscopy. You may have a sigmoidoscopy every 5 years or a colonoscopy every 10 years starting at age 29. Hepatitis C blood test. Hepatitis B blood test. Sexually transmitted disease (STD) testing. Diabetes screening. This is done by checking your blood sugar (glucose) after you have not eaten for a while (fasting). You may have this done every 1-3 years. Bone density scan. This is done to screen for osteoporosis. You may have this done starting at age 66. Mammogram. This may be done every 1-2 years. Talk to your health care provider about how often you should have regular mammograms. Talk with your health care provider about your test results, treatment options, and if necessary, the need for more tests. Vaccines  Your health care  provider may recommend certain vaccines, such as: Influenza vaccine. This is recommended every year. Tetanus, diphtheria, and  acellular pertussis (Tdap, Td) vaccine. You may need a Td booster every 10 years. Zoster vaccine. You may need this after age 57. Pneumococcal 13-valent conjugate (PCV13) vaccine. One dose is recommended after age 14. Pneumococcal polysaccharide (PPSV23) vaccine. One dose is recommended after age 73. Talk to your health care provider about which screenings and vaccines you need and how often you need them. This information is not intended to replace advice given to you by your health care provider. Make sure you discuss any questions you have with your health care provider. Document Released: 01/27/2015 Document Revised: 09/20/2015 Document Reviewed: 11/01/2014 Elsevier Interactive Patient Education  2017 [employer] Prevention in the Home Falls can cause injuries. They can happen to people of all ages. There are many things you can do to make your home safe and to help prevent falls. What can I do on the outside of my home? Regularly fix the edges of walkways and driveways and fix any cracks. Remove anything that might make you trip as you walk through a door, such as a raised step or threshold. Trim any bushes or trees on the path to your home. Use bright outdoor lighting. Clear any walking paths of anything that might make someone trip, such as rocks or tools. Regularly check to see if handrails are loose or broken. Make sure that both sides of any steps have handrails. Any raised decks and porches should have guardrails on the edges. Have any leaves, snow, or ice cleared regularly. Use sand or salt on walking paths during winter. Clean up any spills in your garage right away. This includes oil or grease spills. What can I do in the bathroom? Use night lights. Install grab bars by the toilet and in the tub and shower. Do not use towel bars as grab bars. Use non-skid mats or decals in the tub or shower. If you need to sit down in the shower, use a plastic, non-slip stool. Keep  the floor dry. Clean up any water that spills on the floor as soon as it happens. Remove soap buildup in the tub or shower regularly. Attach bath mats securely with double-sided non-slip rug tape. Do not have throw rugs and other things on the floor that can make you trip. What can I do in the bedroom? Use night lights. Make sure that you have a light by your bed that is easy to reach. Do not use any sheets or blankets that are too big for your bed. They should not hang down onto the floor. Have a firm chair that has side arms. You can use this for support while you get dressed. Do not have throw rugs and other things on the floor that can make you trip. What can I do in the kitchen? Clean up any spills right away. Avoid walking on wet floors. Keep items that you use a lot in easy-to-reach places. If you need to reach something above you, use a strong step stool that has a grab bar. Keep electrical cords out of the way. Do not use floor polish or wax that makes floors slippery. If you must use wax, use non-skid floor wax. Do not have throw rugs and other things on the floor that can make you trip. What can I do with my stairs? Do not leave any items on the stairs. Make sure that there are handrails on both  sides of the stairs and use them. Fix handrails that are broken or loose. Make sure that handrails are as long as the stairways. Check any carpeting to make sure that it is firmly attached to the stairs. Fix any carpet that is loose or worn. Avoid having throw rugs at the top or bottom of the stairs. If you do have throw rugs, attach them to the floor with carpet tape. Make sure that you have a light switch at the top of the stairs and the bottom of the stairs. If you do not have them, ask someone to add them for you. What else can I do to help prevent falls? Wear shoes that: Do not have high heels. Have rubber bottoms. Are comfortable and fit you well. Are closed at the toe. Do not  wear sandals. If you use a stepladder: Make sure that it is fully opened. Do not climb a closed stepladder. Make sure that both sides of the stepladder are locked into place. Ask someone to hold it for you, if possible. Clearly mark and make sure that you can see: Any grab bars or handrails. First and last steps. Where the edge of each step is. Use tools that help you move around (mobility aids) if they are needed. These include: Canes. Walkers. Scooters. Crutches. Turn on the lights when you go into a dark area. Replace any light bulbs as soon as they burn out. Set up your furniture so you have a clear path. Avoid moving your furniture around. If any of your floors are uneven, fix them. If there are any pets around you, be aware of where they are. Review your medicines with your doctor. Some medicines can make you feel dizzy. This can increase your chance of falling. Ask your doctor what other things that you can do to help prevent falls. This information is not intended to replace advice given to you by your health care provider. Make sure you discuss any questions you have with your health care provider. Document Released: 10/27/2008 Document Revised: 06/08/2015 Document Reviewed: 02/04/2014 Elsevier Interactive Patient Education  2017 [employer].

## 2021-07-02 DIAGNOSIS — F411 Generalized anxiety disorder: Secondary | ICD-10-CM | POA: Diagnosis not present

## 2021-07-23 DIAGNOSIS — F411 Generalized anxiety disorder: Secondary | ICD-10-CM | POA: Diagnosis not present

## 2021-08-04 ENCOUNTER — Other Ambulatory Visit: Payer: Self-pay | Admitting: Internal Medicine

## 2021-10-01 DIAGNOSIS — F411 Generalized anxiety disorder: Secondary | ICD-10-CM | POA: Diagnosis not present

## 2021-10-08 DIAGNOSIS — F411 Generalized anxiety disorder: Secondary | ICD-10-CM | POA: Diagnosis not present

## 2021-10-15 DIAGNOSIS — F411 Generalized anxiety disorder: Secondary | ICD-10-CM | POA: Diagnosis not present

## 2021-12-11 NOTE — Progress Notes (Unsigned)
No chief complaint on file.   HPI: Patient  Stacey Young  66 y.o. comes in today for Preventive Health Care visit   Health Maintenance  Topic Date Due   DEXA SCAN  Never done   COVID-19 Vaccine (6 - 2023-24 season) 09/14/2021   Medicare Annual Wellness (AWV)  06/13/2022   MAMMOGRAM  12/15/2022   Fecal DNA (Cologuard)  01/16/2024   Pneumonia Vaccine 100+ Years old  Completed   INFLUENZA VACCINE  Completed   Hepatitis C Screening  Completed   Zoster Vaccines- Shingrix  Completed   HPV VACCINES  Aged Out   COLONOSCOPY (Pts 45-17yr Insurance coverage will need to be confirmed)  Discontinued   Health Maintenance Review LIFESTYLE:  Exercise:   Tobacco/ETS: Alcohol:  Sugar beverages: Sleep: Drug use: no HH of  Work:    ROS:  GEN/ HEENT: No fever, significant weight changes sweats headaches vision problems hearing changes, CV/ PULM; No chest pain shortness of breath cough, syncope,edema  change in exercise tolerance. GI /GU: No adominal pain, vomiting, change in bowel habits. No blood in the stool. No significant GU symptoms. SKIN/HEME: ,no acute skin rashes suspicious lesions or bleeding. No lymphadenopathy, nodules, masses.  NEURO/ PSYCH:  No neurologic signs such as weakness numbness. No depression anxiety. IMM/ Allergy: No unusual infections.  Allergy .   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   for fibroids and endometriosis    Family History  Problem Relation Age of  Onset   Arthritis Mother    Obstructive Sleep Apnea Mother        on CPAP   Cancer Father        bladder   Hyperlipidemia Father    Migraines Sister    Hyperlipidemia Sister        low wbc   Dementia Other        older aunt uncle gm [id number]   Social History   Socioeconomic History   Marital status: Married    Spouse name: Not on file   Number of children: Not on file   Years of education: Not on file   Highest education level: Not on file  Occupational History   Not on file  Tobacco Use   Smoking status: Never   Smokeless tobacco: Never  Vaping Use   Vaping Use: Never used  Substance and Sexual Activity   Alcohol use: Yes    Alcohol/week: 1.0 standard drink of alcohol    Types: 1 Glasses of wine per week   Drug use: No   Sexual activity: Not on file  Other Topics Concern   Not on file  Social History Narrative   Never Smoked   Alcohol use-yes about 2 per day red wine   Regular Exercise-yes  Drug use- no   hh of 2    Pet dog   Married education PhD   Education officer, museum a lot internationally 2 weeks out of each month about   orig from [place] Hx   Social Determinants of Health   Financial Resource Strain: Low Risk  (06/12/2021)   Overall Financial Resource Strain (CARDIA)    Difficulty of Paying Living Expenses: Not hard at all  Food Insecurity: No Food Insecurity (06/12/2021)   Hunger Vital Sign    Worried About Running Out of Food in the Last Year: Never true    [place] in the Last Year: Never true  Transportation Needs: No Transportation Needs (06/12/2021)   PRAPARE - Hydrologist (Medical): No    Lack of Transportation (Non-Medical): No  Physical Activity: Sufficiently Active (06/12/2021)   Exercise Vital Sign    Days of Exercise per Week: 7 days    Minutes of Exercise per Session: 60 min  Stress: No Stress Concern Present (06/12/2021)   [place]    Feeling of Stress : Not at all  Social Connections: Moderately Isolated (06/12/2021)   Social Connection and Isolation Panel [NHANES]    Frequency of Communication with Friends and Family: More than three times a week    Frequency of Social Gatherings with Friends and Family: Twice a week    Attends Religious Services: Never    Marine scientist or Organizations: No    Attends Archivist Meetings: Never    Marital Status: Married    Outpatient Medications Prior to Visit  Medication Sig Dispense Refill   donepezil (ARICEPT) 10 MG tablet Take 10 mg by mouth daily.     losartan-hydrochlorothiazide (HYZAAR) 100-25 MG tablet TAKE 1 TABLET BY MOUTH DAILY 90 tablet 1   vitamin B-12 (CYANOCOBALAMIN) 100 MCG tablet Take 100 mcg by mouth daily.     No facility-administered medications prior to visit.     EXAM:  There were no vitals taken for this visit.  There is no height or weight on file to calculate BMI. Wt Readings from Last 3 Encounters:  01/10/21 151 lb 6.4 oz (68.7 kg)  12/11/20 151 lb (68.5 kg)  11/14/20 152 lb 12.8 oz (69.3 kg)    Physical Exam: Vital signs reviewed KCL:EXNT is a well-developed well-nourished alert cooperative    who appearsr stated age in no acute distress.  HEENT: normocephalic atraumatic , Eyes: PERRL EOM's full, conjunctiva clear, Nares: paten,t no deformity discharge or tenderness., Ears: no deformity EAC's clear TMs with normal landmarks. Mouth: clear OP, no lesions, edema.  Moist mucous membranes. Dentition in adequate repair. NECK: supple without masses, thyromegaly or bruits. CHEST/PULM:  Clear to auscultation and percussion breath sounds equal no wheeze , rales or rhonchi. No chest wall deformities or tenderness. Breast: normal by inspection . No dimpling, discharge, masses, tenderness or discharge . CV: PMI is nondisplaced, S1 S2 no gallops, murmurs, rubs. Peripheral pulses are full without  delay.No JVD .  ABDOMEN: Bowel sounds normal nontender  No guard or rebound, no hepato splenomegal no CVA tenderness.  No hernia. Extremtities:  No clubbing cyanosis or edema, no acute joint swelling or redness no focal atrophy NEURO:  Oriented x3, cranial nerves 3-12 appear to be intact, no obvious focal weakness,gait within normal limits no abnormal reflexes or asymmetrical SKIN: No acute rashes normal turgor, color,  no bruising or petechiae. PSYCH: Oriented, good eye contact, no obvious depression anxiety, cognition and judgment appear normal. LN: no cervical axillary inguinal adenopathy  Lab Results  Component Value Date   WBC 4.6 12/11/2020   HGB 13.7 12/11/2020   HCT 40.4 12/11/2020   PLT 231.0 12/11/2020   GLUCOSE 109 (H) 12/11/2020   CHOL 201 (H) 12/11/2020   TRIG 175.0 (H) 12/11/2020   HDL 67.20 12/11/2020   LDLCALC 99 12/11/2020   ALT 19 12/11/2020   AST 18 12/11/2020   NA 140 12/11/2020   K 3.6 12/11/2020   CL 103 12/11/2020   CREATININE 0.78 12/11/2020   BUN 19 12/11/2020   CO2 29 12/11/2020   TSH 1.00 12/11/2020   INR 1.0 04/26/2019   HGBA1C 5.7 12/11/2020    BP Readings from Last 3 Encounters:  02/14/21 120/68  01/10/21 118/76  12/11/20 122/60    Lab results reviewed with patient   ASSESSMENT AND PLAN:  Discussed the following assessment and plan:    ICD-10-CM   1. Visit for preventive health examination  Z00.00     2. Medication management  Z79.899     3. Essential hypertension  I10     4. HYPERGLYCEMIA  R73.09     5. Leukopenia, unspecified type  D72.819     6. 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  Patient Care Team: Burnis Medin, MD as PCP - General There are no Patient Instructions on file for this visit.  Standley Brooking. Sinead Hockman M.D.

## 2021-12-12 ENCOUNTER — Encounter: Payer: Self-pay | Admitting: Internal Medicine

## 2021-12-12 ENCOUNTER — Ambulatory Visit (INDEPENDENT_AMBULATORY_CARE_PROVIDER_SITE_OTHER): Payer: PPO | Admitting: Internal Medicine

## 2021-12-12 VITALS — BP 124/86 | HR 55 | Temp 98.3°F | Ht 64.0 in | Wt 152.8 lb

## 2021-12-12 DIAGNOSIS — D72819 Decreased white blood cell count, unspecified: Secondary | ICD-10-CM | POA: Diagnosis not present

## 2021-12-12 DIAGNOSIS — G9389 Other specified disorders of brain: Secondary | ICD-10-CM | POA: Diagnosis not present

## 2021-12-12 DIAGNOSIS — M25551 Pain in right hip: Secondary | ICD-10-CM | POA: Diagnosis not present

## 2021-12-12 DIAGNOSIS — Z79899 Other long term (current) drug therapy: Secondary | ICD-10-CM

## 2021-12-12 DIAGNOSIS — R7309 Other abnormal glucose: Secondary | ICD-10-CM

## 2021-12-12 DIAGNOSIS — Z0000 Encounter for general adult medical examination without abnormal findings: Secondary | ICD-10-CM | POA: Diagnosis not present

## 2021-12-12 DIAGNOSIS — I10 Essential (primary) hypertension: Secondary | ICD-10-CM | POA: Diagnosis not present

## 2021-12-12 LAB — LIPID PANEL
Cholesterol: 206 mg/dL — ABNORMAL HIGH (ref 0–200)
HDL: 67.9 mg/dL (ref >39.00)
LDL Cholesterol: 121 mg/dL — ABNORMAL HIGH (ref 0–99)
NonHDL: 137.76
Total CHOL/HDL Ratio: 3
Triglycerides: 82 mg/dL (ref 0.0–149.0)
VLDL: 16.4 mg/dL (ref 0.0–40.0)

## 2021-12-12 LAB — CBC WITH DIFFERENTIAL/PLATELET
Basophils Absolute: 0 10*3/uL (ref 0.0–0.1)
Basophils Relative: 0.6 % (ref 0.0–3.0)
Eosinophils Absolute: 0 10*3/uL (ref 0.0–0.7)
Eosinophils Relative: 1.3 % (ref 0.0–5.0)
HCT: 40.6 % (ref 36.0–46.0)
Hemoglobin: 13.8 g/dL (ref 12.0–15.0)
Lymphocytes Relative: 55 % — ABNORMAL HIGH (ref 12.0–46.0)
Lymphs Abs: 1.8 10*3/uL (ref 0.7–4.0)
MCHC: 34.1 g/dL (ref 30.0–36.0)
MCV: 90.3 fl (ref 78.0–100.0)
Monocytes Absolute: 0.4 10*3/uL (ref 0.1–1.0)
Monocytes Relative: 13.4 % — ABNORMAL HIGH (ref 3.0–12.0)
Neutro Abs: 1 10*3/uL — ABNORMAL LOW (ref 1.4–7.7)
Neutrophils Relative %: 29.7 % — ABNORMAL LOW (ref 43.0–77.0)
Platelets: 240 10*3/uL (ref 150.0–400.0)
RBC: 4.49 Mil/uL (ref 3.87–5.11)
RDW: 13.1 % (ref 11.5–15.5)
WBC: 3.3 10*3/uL — ABNORMAL LOW (ref 4.0–10.5)

## 2021-12-12 LAB — HEPATIC FUNCTION PANEL
ALT: 23 U/L (ref 0–35)
AST: 19 U/L (ref 0–37)
Albumin: 4.2 g/dL (ref 3.5–5.2)
Alkaline Phosphatase: 55 U/L (ref 39–117)
Bilirubin, Direct: 0.1 mg/dL (ref 0.0–0.3)
Total Bilirubin: 0.6 mg/dL (ref 0.2–1.2)
Total Protein: 7.3 g/dL (ref 6.0–8.3)

## 2021-12-12 LAB — TSH: TSH: 1.3 u[IU]/mL (ref 0.35–5.50)

## 2021-12-12 LAB — BASIC METABOLIC PANEL
BUN: 17 mg/dL (ref 6–23)
CO2: 30 mEq/L (ref 19–32)
Calcium: 9.4 mg/dL (ref 8.4–10.5)
Chloride: 104 mEq/L (ref 96–112)
Creatinine, Ser: 0.8 mg/dL (ref 0.40–1.20)
GFR: 76.19 mL/min (ref >60.00)
Glucose, Bld: 106 mg/dL — ABNORMAL HIGH (ref 70–99)
Potassium: 3.9 mEq/L (ref 3.5–5.1)
Sodium: 139 mEq/L (ref 135–145)

## 2021-12-12 LAB — HEMOGLOBIN A1C: Hgb A1c MFr Bld: 5.8 % (ref 4.6–6.5)

## 2021-12-12 LAB — C-REACTIVE PROTEIN: CRP: <1 mg/dL (ref 0.5–20.0)

## 2021-12-12 NOTE — Progress Notes (Signed)
Cholesterol blood count about the same . Rest of labs normal  no diabetes but blood  sugar borderline The 10-year ASCVD risk score (Arnett DK, et al., 2019) is: 9.6%   Values used to calculate the score:     Age: 66 years     Sex: Female     Is Non-Hispanic African American: Yes     Diabetic: No     Tobacco smoker: No     Systolic Blood Pressure: 568 mmHg     Is BP treated: Yes     HDL Cholesterol: 67.9 mg/dL     Total Cholesterol: 206 mg/dL

## 2021-12-12 NOTE — Patient Instructions (Signed)
Good to see  you today . Consider  topical patch antiinflammatory  seeing sports medicine  seems  like a bursitis hip as opposed to back .   Lab today .

## 2021-12-18 ENCOUNTER — Telehealth: Payer: Self-pay | Admitting: Internal Medicine

## 2021-12-18 ENCOUNTER — Other Ambulatory Visit: Payer: Self-pay | Admitting: Internal Medicine

## 2021-12-18 DIAGNOSIS — Z1231 Encounter for screening mammogram for malignant neoplasm of breast: Secondary | ICD-10-CM

## 2021-12-18 NOTE — Telephone Encounter (Signed)
Pt is calling and she does not understand her blood work results on [employer] and would like a callback

## 2021-12-19 NOTE — Telephone Encounter (Signed)
Pt called back to see if anyone can provide her with insight about her bloodwork results.   Please advise.

## 2021-12-19 NOTE — Telephone Encounter (Signed)
Went over lab result to patient. Patient would like to further discuss with  Dr. Regis Bill regards to her blood sugar and cholesterol. Scheduled pt a virtual visit on December 21,2023 '@8'$ :15am.

## 2022-01-03 ENCOUNTER — Telehealth (INDEPENDENT_AMBULATORY_CARE_PROVIDER_SITE_OTHER): Payer: PPO | Admitting: Family

## 2022-01-03 DIAGNOSIS — I10 Essential (primary) hypertension: Secondary | ICD-10-CM | POA: Diagnosis not present

## 2022-01-03 DIAGNOSIS — R7309 Other abnormal glucose: Secondary | ICD-10-CM | POA: Diagnosis not present

## 2022-01-03 NOTE — Progress Notes (Signed)
   Virtual Visit via Video   I connected with patient on 01/03/22 at  8:15 AM EST by a video enabled telemedicine application and verified that I am speaking with the correct person using two identifiers.  Location patient: Home Location provider: Public house manager, Office Persons participating in the virtual visit: Patient, Provider, CMA  I discussed the limitations of evaluation and management by telemedicine and the availability of in person appointments. The patient expressed understanding and agreed to proceed.  Subjective:   HPI:  66 year old female presents today with concerns of elevated glucose levels. Her glucose level fasting was 106 but A1c was 5.8. She wants too better understand what she should be doing to address the elevation in glucose levels. Patient reports walking 5 miles per day. Eats potato chips but has not eaten white bread in years.   ROS:   See pertinent positives and negatives per HPI.  Patient Active Problem List   Diagnosis Date Noted   Acute medial meniscal tear 09/11/2015   Patellofemoral syndrome of both knees 09/11/2015   Knee MCL sprain 09/11/2015   Essential hypertension 11/19/2013   Occasional numbness/prickling/tingling right mid toes 12/09/2012   Family history of diabetes mellitus 12/09/2012   Anterior knee pain 12/03/2010   Sleep disturbances 12/03/2010   Visit for preventive health examination 12/03/2010   Foot pain, left 09/22/2010   LEUKOPENIA, MILD 11/13/2009   BACK PAIN 07/24/2009   HYPERGLYCEMIA 07/24/2009    Social History   Tobacco Use   Smoking status: Never   Smokeless tobacco: Never  Substance Use Topics   Alcohol use: Yes    Alcohol/week: 1.0 standard drink of alcohol    Types: 1 Glasses of wine per week    Current Outpatient Medications:    donepezil (ARICEPT) 10 MG tablet, Take 10 mg by mouth daily., Disp: , Rfl:    losartan-hydrochlorothiazide (HYZAAR) 100-25 MG tablet, TAKE 1 TABLET BY MOUTH DAILY, Disp: 90  tablet, Rfl: 1   vitamin B-12 (CYANOCOBALAMIN) 100 MCG tablet, Take 100 mcg by mouth daily., Disp: , Rfl:   No Known Allergies  Objective:   There were no vitals taken for this visit.  Patient is well-developed, well-nourished in no acute distress.  Resting comfortably at home.  Head is normocephalic, atraumatic.  No labored breathing.  Speech is clear and coherent with logical content.  Patient is alert and oriented at baseline.    Assessment and Plan:   Shalanda was seen today for results.  Diagnoses and all orders for this visit:  Essential hypertension  HYPERGLYCEMIA  Continue exercising. Watch intake of white breads, pasta, potatoes, and rice. Explained A1C vs a fasting glucose and the significance of the A1c.  Recheck in 4-6 months.   Kennyth Arnold, FNP 01/03/2022  Time spent with the patient: 25 minutes, of which >50% was spent in obtaining information about symptoms, reviewing previous labs, evaluations, and treatments, counseling about condition (please see the discussed topics above), and developing a plan to further investigate it; had a number of questions which I addressed.

## 2022-02-01 ENCOUNTER — Ambulatory Visit: Payer: PPO

## 2022-02-04 ENCOUNTER — Telehealth: Payer: Self-pay | Admitting: Internal Medicine

## 2022-02-04 ENCOUNTER — Other Ambulatory Visit: Payer: Self-pay

## 2022-02-04 MED ORDER — LOSARTAN POTASSIUM-HCTZ 100-25 MG PO TABS: 1.0000 | ORAL_TABLET | Freq: Every day | ORAL | 1 refills | Status: DC

## 2022-02-04 NOTE — Telephone Encounter (Signed)
Prescription Request  02/04/2022  Is this a "Controlled Substance" medicine? No  LOV: 12/12/2021  What is the name of the medication or equipment? losartan-hydrochlorothiazide (HYZAAR) 100-25 MG tablet  Have you contacted your pharmacy to request a refill? Yes, they stated she didn't have any refills left.  Which pharmacy would you like this sent to?  [id number] - [place], [address] AT [address] [place] [address] Phone: [phone] Fax: [phone]   Patient notified that their request is being sent to the clinical staff for review and that they should receive a response within 2 business days.   Please advise at Mobile [phone] (mobile)

## 2022-02-04 NOTE — Telephone Encounter (Signed)
6 month supply sent to [facility]

## 2022-02-05 ENCOUNTER — Other Ambulatory Visit: Payer: Self-pay | Admitting: Internal Medicine

## 2022-02-08 ENCOUNTER — Other Ambulatory Visit: Payer: Self-pay | Admitting: Family

## 2022-02-08 ENCOUNTER — Other Ambulatory Visit: Payer: Self-pay | Admitting: Internal Medicine

## 2022-02-08 MED ORDER — DONEPEZIL HCL 10 MG PO TABS: 10.0000 mg | ORAL_TABLET | Freq: Every day | ORAL | 1 refills | Status: DC

## 2022-02-12 DIAGNOSIS — H18413 Arcus senilis, bilateral: Secondary | ICD-10-CM | POA: Diagnosis not present

## 2022-02-12 DIAGNOSIS — H25043 Posterior subcapsular polar age-related cataract, bilateral: Secondary | ICD-10-CM | POA: Diagnosis not present

## 2022-02-12 DIAGNOSIS — H2513 Age-related nuclear cataract, bilateral: Secondary | ICD-10-CM | POA: Diagnosis not present

## 2022-02-12 DIAGNOSIS — H25013 Cortical age-related cataract, bilateral: Secondary | ICD-10-CM | POA: Diagnosis not present

## 2022-02-12 DIAGNOSIS — H2511 Age-related nuclear cataract, right eye: Secondary | ICD-10-CM | POA: Diagnosis not present

## 2022-02-19 ENCOUNTER — Ambulatory Visit: Payer: PPO

## 2022-02-22 ENCOUNTER — Ambulatory Visit
Admission: RE | Admit: 2022-02-22 | Discharge: 2022-02-22 | Disposition: A | Discharge status: Home / Self Care | Payer: Medicare Other | Source: Ambulatory Visit | Attending: Internal Medicine | Admitting: Internal Medicine

## 2022-02-22 DIAGNOSIS — Z1231 Encounter for screening mammogram for malignant neoplasm of breast: Secondary | ICD-10-CM

## 2022-04-03 DIAGNOSIS — F411 Generalized anxiety disorder: Secondary | ICD-10-CM | POA: Diagnosis not present

## 2022-04-06 ENCOUNTER — Other Ambulatory Visit: Payer: Self-pay | Admitting: Family

## 2022-04-22 ENCOUNTER — Other Ambulatory Visit: Payer: Self-pay

## 2022-04-22 NOTE — Telephone Encounter (Signed)
Received fax from [facility] on Refill Request.   Pt is requesting 90 days refill.  Lov: 01/03/2022

## 2022-04-23 MED ORDER — DONEPEZIL HCL 10 MG PO TABS: 10.0000 mg | ORAL_TABLET | Freq: Every day | ORAL | 0 refills | Status: DC

## 2022-04-23 NOTE — Telephone Encounter (Signed)
Prescription Request  04/23/2022  LOV: 12/12/21  What is the name of the medication or equipment?  donepezil (ARICEPT) 10 MG tablet  Pt states she only has about a week's worth left.  Pt would like a 90 day supply.  Have you contacted your pharmacy to request a refill?  Yes   Which pharmacy would you like this sent to?    [id number] - [place], [place] -  [address] AT [facility] [address] & [address] [place] [address] Phone: [phone] Fax: [phone]    Patient notified that their request is being sent to the clinical staff for review and that they should receive a response within 2 business days.   Please advise at Mobile [phone] (mobile)

## 2022-04-25 ENCOUNTER — Telehealth: Payer: Self-pay | Admitting: Internal Medicine

## 2022-04-25 NOTE — Telephone Encounter (Signed)
Contacted Stacey Young to schedule their annual wellness visit. Appointment made for 04/26/22.  [name] AWV direct phone # [phone]

## 2022-04-26 ENCOUNTER — Ambulatory Visit (INDEPENDENT_AMBULATORY_CARE_PROVIDER_SITE_OTHER): Payer: PPO

## 2022-04-26 VITALS — Ht 64.0 in | Wt 152.0 lb

## 2022-04-26 DIAGNOSIS — Z0000 Encounter for general adult medical examination without abnormal findings: Secondary | ICD-10-CM

## 2022-04-26 DIAGNOSIS — Z13820 Encounter for screening for osteoporosis: Secondary | ICD-10-CM

## 2022-04-26 NOTE — Patient Instructions (Addendum)
Stacey Young , Thank you for taking time to come for your Medicare Wellness Visit. I appreciate your ongoing commitment to your health goals. Please review the following plan we discussed and let me know if I can assist you in the future.   These are the goals we discussed:  Goals       No current goals (pt-stated)        This is a list of the screening recommended for you and due dates:  Health Maintenance  Topic Date Due   DEXA scan (bone density measurement)  Never done   COVID-19 Vaccine (6 - 2023-24 season) 05/12/2022*   Flu Shot  08/15/2022   Medicare Annual Wellness Visit  04/26/2023   Cologuard (Stool DNA test)  01/16/2024   Mammogram  02/23/2024   DTaP/Tdap/Td vaccine (3 - Td or Tdap) 08/02/2029   Pneumonia Vaccine  Completed   Hepatitis C Screening: USPSTF Recommendation to screen - Ages 68-79 yo.  Completed   Zoster (Shingles) Vaccine  Completed   HPV Vaccine  Aged Out   Colon Cancer Screening  Discontinued  *Topic was postponed. The date shown is not the original due date.    Advanced directives: In chart  Conditions/risks identified: None  Next appointment: Follow up in one year for your annual wellness visit     Preventive Care 65 Years and Older, Female Preventive care refers to lifestyle choices and visits with your health care provider that can promote health and wellness. What does preventive care include? A yearly physical exam. This is also called an annual well check. Dental exams once or twice a year. Routine eye exams. Ask your health care provider how often you should have your eyes checked. Personal lifestyle choices, including: Daily care of your teeth and gums. Regular physical activity. Eating a healthy diet. Avoiding tobacco and drug use. Limiting alcohol use. Practicing safe sex. Taking low-dose aspirin every day. Taking vitamin and mineral supplements as recommended by your health care provider. What happens during an annual well  check? The services and screenings done by your health care provider during your annual well check will depend on your age, overall health, lifestyle risk factors, and family history of disease. Counseling  Your health care provider may ask you questions about your: Alcohol use. Tobacco use. Drug use. Emotional well-being. Home and relationship well-being. Sexual activity. Eating habits. History of falls. Memory and ability to understand (cognition). Work and work Astronomer. Reproductive health. Screening  You may have the following tests or measurements: Height, weight, and BMI. Blood pressure. Lipid and cholesterol levels. These may be checked every 5 years, or more frequently if you are over 50 years old. Skin check. Lung cancer screening. You may have this screening every year starting at age 68 if you have a 30-pack-year history of smoking and currently smoke or have quit within the past 15 years. Fecal occult blood test (FOBT) of the stool. You may have this test every year starting at age 68. Flexible sigmoidoscopy or colonoscopy. You may have a sigmoidoscopy every 5 years or a colonoscopy every 10 years starting at age 68. Hepatitis C blood test. Hepatitis B blood test. Sexually transmitted disease (STD) testing. Diabetes screening. This is done by checking your blood sugar (glucose) after you have not eaten for a while (fasting). You may have this done every 1-3 years. Bone density scan. This is done to screen for osteoporosis. You may have this done starting at age 68. Mammogram. This may be done  every 1-2 years. Talk to your health care provider about how often you should have regular mammograms. Talk with your health care provider about your test results, treatment options, and if necessary, the need for more tests. Vaccines  Your health care provider may recommend certain vaccines, such as: Influenza vaccine. This is recommended every year. Tetanus, diphtheria, and  acellular pertussis (Tdap, Td) vaccine. You may need a Td booster every 10 years. Zoster vaccine. You may need this after age 68. Pneumococcal 13-valent conjugate (PCV13) vaccine. One dose is recommended after age 68. Pneumococcal polysaccharide (PPSV23) vaccine. One dose is recommended after age 68. Talk to your health care provider about which screenings and vaccines you need and how often you need them. This information is not intended to replace advice given to you by your health care provider. Make sure you discuss any questions you have with your health care provider. Document Released: 01/27/2015 Document Revised: 09/20/2015 Document Reviewed: 11/01/2014 Elsevier Interactive Patient Education  2017 [place] Prevention in the Home Falls can cause injuries. They can happen to people of all ages. There are many things you can do to make your home safe and to help prevent falls. What can I do on the outside of my home? Regularly fix the edges of walkways and driveways and fix any cracks. Remove anything that might make you trip as you walk through a door, such as a raised step or threshold. Trim any bushes or trees on the path to your home. Use bright outdoor lighting. Clear any walking paths of anything that might make someone trip, such as rocks or tools. Regularly check to see if handrails are loose or broken. Make sure that both sides of any steps have handrails. Any raised decks and porches should have guardrails on the edges. Have any leaves, snow, or ice cleared regularly. Use sand or salt on walking paths during winter. Clean up any spills in your garage right away. This includes oil or grease spills. What can I do in the bathroom? Use night lights. Install grab bars by the toilet and in the tub and shower. Do not use towel bars as grab bars. Use non-skid mats or decals in the tub or shower. If you need to sit down in the shower, use a plastic, non-slip stool. Keep  the floor dry. Clean up any water that spills on the floor as soon as it happens. Remove soap buildup in the tub or shower regularly. Attach bath mats securely with double-sided non-slip rug tape. Do not have throw rugs and other things on the floor that can make you trip. What can I do in the bedroom? Use night lights. Make sure that you have a light by your bed that is easy to reach. Do not use any sheets or blankets that are too big for your bed. They should not hang down onto the floor. Have a firm chair that has side arms. You can use this for support while you get dressed. Do not have throw rugs and other things on the floor that can make you trip. What can I do in the kitchen? Clean up any spills right away. Avoid walking on wet floors. Keep items that you use a lot in easy-to-reach places. If you need to reach something above you, use a strong step stool that has a grab bar. Keep electrical cords out of the way. Do not use floor polish or wax that makes floors slippery. If you must use wax, use  non-skid floor wax. Do not have throw rugs and other things on the floor that can make you trip. What can I do with my stairs? Do not leave any items on the stairs. Make sure that there are handrails on both sides of the stairs and use them. Fix handrails that are broken or loose. Make sure that handrails are as long as the stairways. Check any carpeting to make sure that it is firmly attached to the stairs. Fix any carpet that is loose or worn. Avoid having throw rugs at the top or bottom of the stairs. If you do have throw rugs, attach them to the floor with carpet tape. Make sure that you have a light switch at the top of the stairs and the bottom of the stairs. If you do not have them, ask someone to add them for you. What else can I do to help prevent falls? Wear shoes that: Do not have high heels. Have rubber bottoms. Are comfortable and fit you well. Are closed at the toe. Do not  wear sandals. If you use a stepladder: Make sure that it is fully opened. Do not climb a closed stepladder. Make sure that both sides of the stepladder are locked into place. Ask someone to hold it for you, if possible. Clearly mark and make sure that you can see: Any grab bars or handrails. First and last steps. Where the edge of each step is. Use tools that help you move around (mobility aids) if they are needed. These include: Canes. Walkers. Scooters. Crutches. Turn on the lights when you go into a dark area. Replace any light bulbs as soon as they burn out. Set up your furniture so you have a clear path. Avoid moving your furniture around. If any of your floors are uneven, fix them. If there are any pets around you, be aware of where they are. Review your medicines with your doctor. Some medicines can make you feel dizzy. This can increase your chance of falling. Ask your doctor what other things that you can do to help prevent falls. This information is not intended to replace advice given to you by your health care provider. Make sure you discuss any questions you have with your health care provider. Document Released: 10/27/2008 Document Revised: 06/08/2015 Document Reviewed: 02/04/2014 Elsevier Interactive Patient Education  2017 [employer].

## 2022-04-26 NOTE — Progress Notes (Signed)
Subjective:   Stacey Young is a 67 y.o. female who presents for Medicare Annual (Subsequent) preventive examination.  Review of Systems    Virtual Visit via Telephone Note  I connected with  Stacey Young on 04/29/22 at  1:00 PM EDT by telephone and verified that I am speaking with the correct person using two identifiers.  Location: Patient: Home Provider: Office Persons participating in the virtual visit: patient/Nurse Health Advisor   I discussed the limitations, risks, security and privacy concerns of performing an evaluation and management service by telephone and the availability of in person appointments. The patient expressed understanding and agreed to proceed.  Interactive audio and video telecommunications were attempted between this nurse and patient, however failed, due to patient having technical difficulties OR patient did not have access to video capability.  We continued and completed visit with audio only.  Some vital signs may be absent or patient reported.   Tillie Rung, LPN  Cardiac Risk Factors include: advanced age (>33men, >42 women);hypertension     Objective:    Today's Vitals   04/26/22 1307  Weight: 152 lb (68.9 kg)  Height: 5\' 4"  (1.626 m)   Body mass index is 26.09 kg/m.     04/26/2022    1:17 PM 06/12/2021    9:55 AM 12/05/2020    4:51 PM 02/22/2019    8:15 AM 04/26/2016    3:19 PM 03/26/2016    1:14 PM 08/29/2014    4:58 PM  Advanced Directives  Does Patient Have a Medical Advance Directive? Yes No No No No No No  Type of Estate agent of [place];Living will        Does patient want to make changes to medical advance directive? No - Patient declined        Copy of Healthcare Power of Attorney in Chart? Yes - validated most recent copy scanned in chart (See row information)        Would patient like information on creating a medical advance directive?  Yes (MAU/Ambulatory/Procedural Areas - Information given)  Yes (MAU/Ambulatory/Procedural Areas - Information given) No - Patient declined   No - patient declined information    Current Medications (verified) Outpatient Encounter Medications as of 04/26/2022  Medication Sig   donepezil (ARICEPT) 10 MG tablet Take 1 tablet (10 mg total) by mouth daily.   losartan-hydrochlorothiazide (HYZAAR) 100-25 MG tablet Take 1 tablet by mouth daily.   vitamin B-12 (CYANOCOBALAMIN) 100 MCG tablet Take 100 mcg by mouth daily.   No facility-administered encounter medications on file as of 04/26/2022.    Allergies (verified) Patient has no known allergies.   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   for fibroids and endometriosis   Family History  Problem Relation Age of Onset  Arthritis Mother    Obstructive Sleep Apnea Mother        on CPAP   Cancer Father        bladder   Hyperlipidemia Father    Migraines Sister    Hyperlipidemia Sister        low wbc   Dementia Other        older aunt uncle gm 40s and 47s   Social History   Socioeconomic History   Marital status: Married    Spouse name: Not on file   Number of children: Not on file   Years of education: Not on file   Highest education level: Not on file  Occupational History   Not on file  Tobacco Use   Smoking status: Never   Smokeless tobacco: Never  Vaping Use   Vaping Use: Never used  Substance and Sexual Activity   Alcohol use: Yes    Alcohol/week: 1.0 standard drink of  alcohol    Types: 1 Glasses of wine per week   Drug use: No   Sexual activity: Not on file  Other Topics Concern   Not on file  Social History Narrative   Never Smoked   Alcohol use-yes about 2 per day red wine   Regular Exercise-yes   Drug use- no   hh of 2    Pet dog   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 about   orig from [place]   Childbirth Hx   Social Determinants of Health   Financial Resource Strain: Low Risk  (04/26/2022)   Overall Financial Resource Strain (CARDIA)    Difficulty of Paying Living Expenses: Not hard at all  Food Insecurity: No Food Insecurity (04/26/2022)   Hunger Vital Sign    Worried About Running Out of Food in the Last Year: Never true    Ran Out of Food in the Last Year: Never true  Transportation Needs: No Transportation Needs (04/26/2022)   PRAPARE - Administrator, Civil Service (Medical): No    Lack of Transportation (Non-Medical): No  Physical Activity: Sufficiently Active (04/26/2022)   Exercise Vital Sign    Days of Exercise per Week: 7 days    Minutes of Exercise per Session: 150+ min  Stress: No Stress Concern Present (04/26/2022)   [employer] of Occupational Health - Occupational Stress Questionnaire    Feeling of Stress : Not at all  Social Connections: Moderately Isolated (04/26/2022)   Social Connection and Isolation Panel [NHANES]    Frequency of Communication with Friends and Family: More than three times a week    Frequency of Social Gatherings with Friends and Family: More than three times a week    Attends Religious Services: Never    Database administrator or Organizations: No    Attends Engineer, structural: Never    Marital Status: Married    Tobacco Counseling Counseling given: Not Answered   Clinical Intake:  Pre-visit preparation completed: No  Pain : No/denies pain     BMI - recorded: 26.09 Nutritional  Status: BMI 25 -29 Overweight Nutritional Risks: None Diabetes: No  How often do you need to have someone help you when you read instructions, pamphlets, or other written materials from your doctor or pharmacy?: 4 - Often (Husband assist)  Diabetic?  No  Interpreter Needed?: No  Information entered by :: Theresa Mulligan LPN   Activities of  Daily Living    04/26/2022    1:15 PM 06/12/2021    9:57 AM  In your present state of health, do you have any difficulty performing the following activities:  Hearing? 0 0  Vision? 0 0  Difficulty concentrating or making decisions? 0 0  Walking or climbing stairs? 0 0  Dressing or bathing? 0 0  Doing errands, shopping? 0 0  Preparing Food and eating ? N N  Using the Toilet? N N  In the past six months, have you accidently leaked urine? N N  Do you have problems with loss of bowel control? N N  Managing your Medications? N N  Managing your Finances? N N  Housekeeping or managing your Housekeeping? N N    Patient Care Team: Madelin Headings, MD as PCP - General  Indicate any recent Medical Services you may have received from other than [facility] providers in the past year (date may be approximate).     Assessment:   This is a routine wellness examination for Kaeli.  Hearing/Vision screen Hearing Screening - Comments:: Denies hearing difficulties   Vision Screening - Comments:: Wears rx glasses - up to date with routine eye exams with  Deferred  Dietary issues and exercise activities discussed: Exercise limited by: None identified   Goals Addressed               This Visit's Progress     No current goals (pt-stated)         Depression Screen    04/29/2022    8:03 AM 12/12/2021   10:06 AM 06/12/2021    9:53 AM 12/11/2020    1:44 PM 12/07/2019    9:53 AM 12/07/2019    8:36 AM 11/30/2018    3:54 PM  PHQ 2/9 Scores  PHQ - 2 Score 0 0 0 0 0 0 0  PHQ- 9 Score  0   0      Fall Risk    04/26/2022    1:16 PM 01/03/2022     8:03 AM 06/12/2021    9:57 AM 12/11/2020    1:44 PM 12/07/2019    8:36 AM  Fall Risk   Falls in the past year? 0 0 0 1 0  Number falls in past yr: 0 0 0 0   Injury with Fall? 0 0 0 1   Risk for fall due to : No Fall Risks No Fall Risks No Fall Risks    Follow up Falls prevention discussed Falls evaluation completed Falls prevention discussed      FALL RISK PREVENTION PERTAINING TO THE HOME:  Any stairs in or around the home? Yes  If so, are there any without handrails? No  Home free of loose throw rugs in walkways, pet beds, electrical cords, etc? Yes  Adequate lighting in your home to reduce risk of falls? Yes   ASSISTIVE DEVICES UTILIZED TO PREVENT FALLS:  Life alert? No  Use of a cane, walker or w/c? No  Grab bars in the bathroom? No  Shower chair or bench in shower? Yes  Elevated toilet seat or a handicapped toilet? No   TIMED UP AND GO:  Was the test performed? No . Audio Visit   Cognitive Function:        04/29/2022    8:06 AM  6CIT Screen  What Year? 0 points  What month? 0 points  What time? 0 points  Count back from 20 0 points  Months in reverse 0 points  Repeat phrase 0 points  Total Score 0 points    Immunizations Immunization History  Administered Date(s) Administered   Fluad Quad(high Dose 65+) 10/11/2020, 09/24/2021   Influenza Inj Mdck Quad Pf 10/06/2018   Influenza Split 12/04/2010, 10/29/2011, 10/19/2012, 10/09/2013   Influenza, High Dose Seasonal PF 09/24/2021   Influenza,inj,Quad PF,6+ Mos 09/21/2014, 10/23/2015, 09/24/2016, 11/14/2017   Influenza-Unspecified 09/24/2016, 09/12/2019   PFIZER Comirnaty(Gray Top)Covid-19 Tri-Sucrose Vaccine 04/28/2020   PFIZER(Purple Top)SARS-COV-2 Vaccination 04/26/2019, 05/30/2019, 10/06/2019   Pfizer Covid-19 Vaccine Bivalent Booster 75yrs & up 05/14/2021   Pneumococcal Conjugate-13 12/07/2019   Pneumococcal Polysaccharide-23 12/11/2020   Rsv, Bivalent, Protein Subunit Rsvpref,pf Verdis Frederickson) 09/24/2021    Td 11/13/2009   Tdap 08/03/2019   Zoster Recombinat (Shingrix) 09/24/2016, 11/28/2016   Zoster, Live 06/14/2014    TDAP status: Up to date  Flu Vaccine status: Up to date  Pneumococcal vaccine status: Up to date  Covid-19 vaccine status: Completed vaccines  Qualifies for Shingles Vaccine? Yes   Zostavax completed Yes   Shingrix Completed?: Yes  Screening Tests Health Maintenance  Topic Date Due   DEXA SCAN  Never done   COVID-19 Vaccine (6 - 2023-24 season) 05/12/2022 (Originally 09/14/2021)   INFLUENZA VACCINE  08/15/2022   Medicare Annual Wellness (AWV)  04/26/2023   Fecal DNA (Cologuard)  01/16/2024   MAMMOGRAM  02/23/2024   DTaP/Tdap/Td (3 - Td or Tdap) 08/02/2029   Pneumonia Vaccine 29+ Years old  Completed   Hepatitis C Screening  Completed   Zoster Vaccines- Shingrix  Completed   HPV VACCINES  Aged Out   COLONOSCOPY (Pts 45-64yrs Insurance coverage will need to be confirmed)  Discontinued    Health Maintenance  Health Maintenance Due  Topic Date Due   DEXA SCAN  Never done    Colorectal cancer screening: No longer required.   Mammogram status: Completed 02/22/22. Repeat every year  Bone Density status: Ordered 04/26/22. Pt provided with contact info and advised to call to schedule appt.  Lung Cancer Screening: (Low Dose CT Chest recommended if Age 34-80 years, 30 pack-year currently smoking OR have quit w/in 15years.) does not qualify.    Additional Screening:  Hepatitis C Screening: does qualify; Completed 11/30/18  Vision Screening: Recommended annual ophthalmology exams for early detection of glaucoma and other disorders of the eye. Is the patient up to date with their annual eye exam?  Yes  Who is the provider or what is the name of the office in which the patient attends annual eye exams? Deferred If pt is not established with a provider, would they like to be referred to a provider to establish care? No .   Dental Screening: Recommended annual  dental exams for proper oral hygiene  Community Resource Referral / Chronic Care Management:  CRR required this visit?  No   CCM required this visit?  No      Plan:     I have personally reviewed and noted the following in the patient's chart:   Medical and social history Use of alcohol, tobacco or illicit drugs  Current medications and supplements including opioid prescriptions. Patient is not currently taking opioid prescriptions. Functional ability and status Nutritional status Physical activity Advanced directives List of other physicians Hospitalizations, surgeries, and ER visits in previous 12 months Vitals Screenings to include cognitive, depression, and falls Referrals and appointments  In addition, I have reviewed and discussed with patient certain preventive protocols, quality metrics, and best practice recommendations. A written personalized care plan for preventive services as well  as general preventive health recommendations were provided to patient.     Tillie Rung, LPN   [id number]   Nurse Notes: Depression and Cognitive Screening were completed on 04/26/22 with score of 0. Refreshed and addend on 04/29/22

## 2022-05-03 DIAGNOSIS — H2511 Age-related nuclear cataract, right eye: Secondary | ICD-10-CM | POA: Diagnosis not present

## 2022-05-03 DIAGNOSIS — H25011 Cortical age-related cataract, right eye: Secondary | ICD-10-CM | POA: Diagnosis not present

## 2022-05-03 DIAGNOSIS — H2512 Age-related nuclear cataract, left eye: Secondary | ICD-10-CM | POA: Diagnosis not present

## 2022-05-17 DIAGNOSIS — H2512 Age-related nuclear cataract, left eye: Secondary | ICD-10-CM | POA: Diagnosis not present

## 2022-05-17 DIAGNOSIS — H25012 Cortical age-related cataract, left eye: Secondary | ICD-10-CM | POA: Diagnosis not present

## 2022-06-14 ENCOUNTER — Other Ambulatory Visit: Payer: Self-pay | Admitting: Family

## 2022-06-18 DIAGNOSIS — H524 Presbyopia: Secondary | ICD-10-CM | POA: Diagnosis not present

## 2022-07-25 ENCOUNTER — Other Ambulatory Visit: Payer: Self-pay | Admitting: Family

## 2022-07-27 ENCOUNTER — Other Ambulatory Visit: Payer: Self-pay | Admitting: Family

## 2022-07-27 ENCOUNTER — Encounter: Payer: Self-pay | Admitting: Internal Medicine

## 2022-07-29 ENCOUNTER — Other Ambulatory Visit: Payer: Self-pay | Admitting: Family

## 2022-07-29 MED ORDER — DONEPEZIL HCL 10 MG PO TABS: 10.0000 mg | ORAL_TABLET | Freq: Every day | ORAL | 0 refills | Status: DC

## 2022-10-02 ENCOUNTER — Other Ambulatory Visit (HOSPITAL_BASED_OUTPATIENT_CLINIC_OR_DEPARTMENT_OTHER): Payer: Self-pay

## 2022-10-02 MED ORDER — INFLUENZA VAC A&B SURF ANT ADJ 0.5 ML IM SUSY
0.5000 mL | PREFILLED_SYRINGE | Freq: Once | INTRAMUSCULAR | 0 refills | Status: AC
  Filled 2022-10-02: qty 0.5, 1d supply, fill #0

## 2022-10-02 MED ORDER — COVID-19 MRNA VAC-TRIS(PFIZER) 30 MCG/0.3ML IM SUSY
0.3000 mL | PREFILLED_SYRINGE | Freq: Once | INTRAMUSCULAR | 0 refills | Status: AC
  Filled 2022-10-02: qty 0.3, 1d supply, fill #0

## 2022-10-08 DIAGNOSIS — K080 Exfoliation of teeth due to systemic causes: Secondary | ICD-10-CM | POA: Diagnosis not present

## 2022-10-15 ENCOUNTER — Other Ambulatory Visit: Payer: Self-pay | Admitting: Internal Medicine

## 2022-10-15 ENCOUNTER — Encounter: Payer: Self-pay | Admitting: Internal Medicine

## 2022-10-15 ENCOUNTER — Telehealth (INDEPENDENT_AMBULATORY_CARE_PROVIDER_SITE_OTHER): Payer: PPO | Admitting: Internal Medicine

## 2022-10-15 VITALS — Ht 64.0 in | Wt 152.0 lb

## 2022-10-15 DIAGNOSIS — I10 Essential (primary) hypertension: Secondary | ICD-10-CM

## 2022-10-15 DIAGNOSIS — R7309 Other abnormal glucose: Secondary | ICD-10-CM

## 2022-10-15 DIAGNOSIS — Z79899 Other long term (current) drug therapy: Secondary | ICD-10-CM | POA: Diagnosis not present

## 2022-10-15 DIAGNOSIS — G9389 Other specified disorders of brain: Secondary | ICD-10-CM

## 2022-10-15 DIAGNOSIS — E7800 Pure hypercholesterolemia, unspecified: Secondary | ICD-10-CM

## 2022-10-15 DIAGNOSIS — G3184 Mild cognitive impairment, so stated: Secondary | ICD-10-CM | POA: Diagnosis not present

## 2022-10-15 DIAGNOSIS — D709 Neutropenia, unspecified: Secondary | ICD-10-CM

## 2022-10-15 DIAGNOSIS — E538 Deficiency of other specified B group vitamins: Secondary | ICD-10-CM

## 2022-10-15 NOTE — Progress Notes (Signed)
Virtual Visit via Video Note  I connected with Stacey Young on 10/15/22 at  3:30 PM EDT by a video enabled telemedicine application and verified that I am speaking with the correct person using two identifiers. Location patient: home Location provider:work office Persons participating in the virtual visit: patient, provider   Patient aware  of the limitations of evaluation and management by telemedicine and  availability of in person appointments. and agreed to proceed.   HPI: Stacey Young presents for video visit for med check  see past notes  BP med is losartan 100.25  no se noted and no exercise intolerance . No chest pain or  pulm.   sx  Cognitive decline :  no longer drives and cannot process reading although if auditory can do ok .  Steps of executive function  is harder for her ( ie backing cake at x mas)   On aricept  says that neuro at [facility] has "kicked her out" since she missed appts but  didn't remember getting notic.    ROS: See pertinent positives and negatives per HPI. No physical new sx eating heathy  Hearing ok has reader   Past Medical History:  Diagnosis Date   Anemia    Back pain    Fibroids    uterine   Hay fever    Hx of colonoscopy    utd   Hx of transfusion    w gall baldder surgery  donates blood ( thus eg screen)   Hx of varicella    as a child   Hypertension    IBS (irritable bowel syndrome)    WBC decreased     Past Surgical History:  Procedure Laterality Date   BLADDER REPAIR W/ CESAREAN SECTION  01/14/1985   GALLBLADDER SURGERY  01/15/1975   left knee susrgery Left    cartilage   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/2001   for fibroids and endometriosis    Family History  Problem Relation Age of Onset   Arthritis Mother    Obstructive Sleep Apnea Mother        on CPAP   Cancer Father        bladder   Hyperlipidemia Father    Migraines Sister    Hyperlipidemia Sister        low wbc   Dementia Other        older  aunt uncle gm 40s and 37s    Social History   Tobacco Use   Smoking status: Never   Smokeless tobacco: Never  Vaping Use   Vaping status: Never Used  Substance Use Topics   Alcohol use: Yes    Alcohol/week: 1.0 standard drink of alcohol    Types: 1 Glasses of wine per week   Drug use: No      Current Outpatient Medications:    donepezil (ARICEPT) 10 MG tablet, Take 1 tablet (10 mg total) by mouth daily., Disp: 90 tablet, Rfl: 0   losartan-hydrochlorothiazide (HYZAAR) 100-25 MG tablet, TAKE 1 TABLET BY MOUTH DAILY, Disp: 90 tablet, Rfl: 1   vitamin B-12 (CYANOCOBALAMIN) 100 MCG tablet, Take 100 mcg by mouth daily., Disp: , Rfl:   EXAM: BP Readings from Last 3 Encounters:  12/12/21 124/86  02/14/21 120/68  01/10/21 118/76    VITALS per patient if applicable:  GENERAL: alert, oriented, appears well and in no acute distress  HEENT: atraumatic, conjunttiva clear, no obvious abnormalities on inspection of external nose and ears  NECK: normal  movements of the head and neck  LUNGS: on inspection no signs of respiratory distress, breathing rate appears normal, no obvious gross SOB, gasping or wheezing  CV: no obvious cyanosis  PSYCH/NEURO: pleasant and cooperative, no obvious depression or anxiety, speech and thought processing grossly intact but some time hard to get expressiojn out . Neuro grossly non focal  Lab Results  Component Value Date   WBC 3.3 (L) 12/12/2021   HGB 13.8 12/12/2021   HCT 40.6 12/12/2021   PLT 240.0 12/12/2021   GLUCOSE 106 (H) 12/12/2021   CHOL 206 (H) 12/12/2021   TRIG 82.0 12/12/2021   HDL 67.90 12/12/2021   LDLCALC 121 (H) 12/12/2021   ALT 23 12/12/2021   AST 19 12/12/2021   NA 139 12/12/2021   K 3.9 12/12/2021   CL 104 12/12/2021   CREATININE 0.80 12/12/2021   BUN 17 12/12/2021   CO2 30 12/12/2021   TSH 1.30 12/12/2021   INR 1.0 04/26/2019   HGBA1C 5.8 12/12/2021  Care every where  last visit well over years ago  see mri brain and  csf markers  2021   ASSESSMENT AND PLAN:  Discussed the following assessment and plan:    ICD-10-CM   1. Essential hypertension  I10     2. MCI (mild cognitive impairment) Dementia  G31.84    csf markers cw alzheimers and mri shows sig atrophy for age    54. Medication management  Z79.899      Continue same medication and keep appt in December . Get lab appt week before to hopefully get results before visit  Can make appt for her and her husband since  he will be coming same day   Consider other neuro locally since she has been dced? Form [facility] for missing appts she wasn't aware  . ( Her husband has been going through cancer rx and  is the driver)  Counseled.  Check bp readings to ensure control.   Expectant management and discussion of plan and treatment with opportunity to ask questions and all were answered. The patient agreed with the plan and demonstrated an understanding of the instructions.   Advised to call back or seek an in-person evaluation if worsening  or having  further concerns  in interim. Return for keep appt in Dec lab pr visit.  Berniece Andreas, MD

## 2022-10-15 NOTE — Progress Notes (Signed)
Future orders for   yearly cpe

## 2022-10-21 ENCOUNTER — Other Ambulatory Visit: Payer: Self-pay | Admitting: Family

## 2022-10-21 ENCOUNTER — Telehealth: Payer: Self-pay | Admitting: Internal Medicine

## 2022-10-21 MED ORDER — DONEPEZIL HCL 10 MG PO TABS: 10.0000 mg | ORAL_TABLET | Freq: Every day | ORAL | 0 refills | Status: DC

## 2022-10-21 NOTE — Telephone Encounter (Signed)
Prescription Request  10/21/2022  LOV: 12/12/2021  What is the name of the medication or equipment?  donepezil (ARICEPT) 10 MG tablet  Have you contacted your pharmacy to request a refill? No   Which pharmacy would you like this sent to?  [id number] - [place], [place] - [address] [facility] [address] [place] [address] Phone: [phone] Fax: [phone]     Patient notified that their request is being sent to the clinical staff for review and that they should receive a response within 2 business days.   Please advise at Mobile [phone] (mobile)

## 2022-11-20 ENCOUNTER — Other Ambulatory Visit: Payer: PPO

## 2022-12-24 ENCOUNTER — Encounter: Payer: Self-pay | Admitting: Internal Medicine

## 2022-12-24 DIAGNOSIS — R4189 Other symptoms and signs involving cognitive functions and awareness: Secondary | ICD-10-CM

## 2022-12-24 NOTE — Telephone Encounter (Signed)
Sorry this is happening  .  I will have [place] rerefer  Still best to stay with same team and cannot guarantee sooner help. I have placed another referral with request for same team and hopefully will help facilitate appt.

## 2023-01-02 ENCOUNTER — Other Ambulatory Visit (INDEPENDENT_AMBULATORY_CARE_PROVIDER_SITE_OTHER): Payer: Medicare Other

## 2023-01-02 DIAGNOSIS — I10 Essential (primary) hypertension: Secondary | ICD-10-CM | POA: Diagnosis not present

## 2023-01-02 DIAGNOSIS — Z79899 Other long term (current) drug therapy: Secondary | ICD-10-CM | POA: Diagnosis not present

## 2023-01-02 DIAGNOSIS — D709 Neutropenia, unspecified: Secondary | ICD-10-CM

## 2023-01-02 DIAGNOSIS — G3184 Mild cognitive impairment, so stated: Secondary | ICD-10-CM

## 2023-01-02 DIAGNOSIS — R7309 Other abnormal glucose: Secondary | ICD-10-CM

## 2023-01-02 DIAGNOSIS — E538 Deficiency of other specified B group vitamins: Secondary | ICD-10-CM

## 2023-01-02 DIAGNOSIS — E7800 Pure hypercholesterolemia, unspecified: Secondary | ICD-10-CM

## 2023-01-02 DIAGNOSIS — G9389 Other specified disorders of brain: Secondary | ICD-10-CM

## 2023-01-02 LAB — CBC WITH DIFFERENTIAL/PLATELET
Basophils Absolute: 0 10*3/uL (ref 0.0–0.1)
Basophils Relative: 0.6 % (ref 0.0–3.0)
Eosinophils Absolute: 0.1 10*3/uL (ref 0.0–0.7)
Eosinophils Relative: 1.6 % (ref 0.0–5.0)
HCT: 40.7 % (ref 36.0–46.0)
Hemoglobin: 13.9 g/dL (ref 12.0–15.0)
Lymphocytes Relative: 49.9 % — ABNORMAL HIGH (ref 12.0–46.0)
Lymphs Abs: 1.7 10*3/uL (ref 0.7–4.0)
MCHC: 34.1 g/dL (ref 30.0–36.0)
MCV: 91.2 fL (ref 78.0–100.0)
Monocytes Absolute: 0.4 10*3/uL (ref 0.1–1.0)
Monocytes Relative: 12 % (ref 3.0–12.0)
Neutro Abs: 1.2 10*3/uL — ABNORMAL LOW (ref 1.4–7.7)
Neutrophils Relative %: 35.9 % — ABNORMAL LOW (ref 43.0–77.0)
Platelets: 244 10*3/uL (ref 150.0–400.0)
RBC: 4.46 Mil/uL (ref 3.87–5.11)
RDW: 13.3 % (ref 11.5–15.5)
WBC: 3.4 10*3/uL — ABNORMAL LOW (ref 4.0–10.5)

## 2023-01-02 LAB — LIPID PANEL
Cholesterol: 210 mg/dL — ABNORMAL HIGH (ref 0–200)
HDL: 69 mg/dL (ref >39.00)
LDL Cholesterol: 126 mg/dL — ABNORMAL HIGH (ref 0–99)
NonHDL: 141.24
Total CHOL/HDL Ratio: 3
Triglycerides: 78 mg/dL (ref 0.0–149.0)
VLDL: 15.6 mg/dL (ref 0.0–40.0)

## 2023-01-02 LAB — BASIC METABOLIC PANEL
BUN: 16 mg/dL (ref 6–23)
CO2: 27 meq/L (ref 19–32)
Calcium: 9.2 mg/dL (ref 8.4–10.5)
Chloride: 103 meq/L (ref 96–112)
Creatinine, Ser: 0.85 mg/dL (ref 0.40–1.20)
GFR: 70.32 mL/min (ref >60.00)
Glucose, Bld: 108 mg/dL — ABNORMAL HIGH (ref 70–99)
Potassium: 3.7 meq/L (ref 3.5–5.1)
Sodium: 139 meq/L (ref 135–145)

## 2023-01-02 LAB — VITAMIN B12: Vitamin B-12: 855 pg/mL (ref 211–911)

## 2023-01-02 LAB — HEPATIC FUNCTION PANEL
ALT: 26 U/L (ref 0–35)
AST: 20 U/L (ref 0–37)
Albumin: 4.1 g/dL (ref 3.5–5.2)
Alkaline Phosphatase: 65 U/L (ref 39–117)
Bilirubin, Direct: 0.1 mg/dL (ref 0.0–0.3)
Total Bilirubin: 0.6 mg/dL (ref 0.2–1.2)
Total Protein: 6.9 g/dL (ref 6.0–8.3)

## 2023-01-02 LAB — TSH: TSH: 2.1 u[IU]/mL (ref 0.35–5.50)

## 2023-01-02 LAB — HEMOGLOBIN A1C: Hgb A1c MFr Bld: 6 % (ref 4.6–6.5)

## 2023-01-05 NOTE — Progress Notes (Signed)
Will review at upcoming  cpe visit this week

## 2023-01-09 ENCOUNTER — Encounter: Payer: Self-pay | Admitting: Internal Medicine

## 2023-01-09 ENCOUNTER — Telehealth: Payer: Self-pay

## 2023-01-09 ENCOUNTER — Ambulatory Visit (INDEPENDENT_AMBULATORY_CARE_PROVIDER_SITE_OTHER): Payer: Medicare Other | Admitting: Internal Medicine

## 2023-01-09 VITALS — BP 106/66 | HR 66 | Temp 97.6°F | Ht 63.98 in | Wt 154.8 lb

## 2023-01-09 DIAGNOSIS — R4189 Other symptoms and signs involving cognitive functions and awareness: Secondary | ICD-10-CM | POA: Diagnosis not present

## 2023-01-09 DIAGNOSIS — Z0000 Encounter for general adult medical examination without abnormal findings: Secondary | ICD-10-CM | POA: Diagnosis not present

## 2023-01-09 DIAGNOSIS — E2839 Other primary ovarian failure: Secondary | ICD-10-CM | POA: Diagnosis not present

## 2023-01-09 DIAGNOSIS — Z13820 Encounter for screening for osteoporosis: Secondary | ICD-10-CM

## 2023-01-09 DIAGNOSIS — Z79899 Other long term (current) drug therapy: Secondary | ICD-10-CM | POA: Diagnosis not present

## 2023-01-09 DIAGNOSIS — I10 Essential (primary) hypertension: Secondary | ICD-10-CM

## 2023-01-09 MED ORDER — LOSARTAN POTASSIUM-HCTZ 100-25 MG PO TABS: 1.0000 | ORAL_TABLET | Freq: Every day | ORAL | 1 refills | Status: DC

## 2023-01-09 MED ORDER — DONEPEZIL HCL 10 MG PO TABS: 10.0000 mg | ORAL_TABLET | Freq: Every day | ORAL | 0 refills | Status: DC

## 2023-01-09 NOTE — Progress Notes (Signed)
Chief Complaint  Patient presents with   Annual Exam    HPI: Patient  Stacey Young  67 y.o. comes in today for Preventive Health Care visit  HT  controlled  no se noted . Cognitive changes    some progression  "cannot read"  audip processing is good . Ie audiobooks  Still writing with barriers    Stopped baking this year  to hard.  To process and remember to easily follow her directions    not lost   to place   Some delay in some work findings  Stays active sleep s ok  Neuro at [facility] not seen for a while   Husband had dx of metastatic cancer to peritoneum  and under rx  s want able to get a ride and  apparently  didn't get adequately notified of appts to be aware of missing.  So at this point  told she would be a new patient . And apaps far out Is on donezapril from neuro  not sure if or how much has helped. No motor speech changes   Health Maintenance  Topic Date Due   DEXA SCAN  Never done   COVID-19 Vaccine (7 - 2024-25 season) 01/25/2023 (Originally 11/27/2022)   Medicare Annual Wellness (AWV)  04/26/2023   Fecal DNA (Cologuard)  01/16/2024   MAMMOGRAM  02/23/2024   DTaP/Tdap/Td (3 - Td or Tdap) 08/02/2029   Pneumonia Vaccine 33+ Years old  Completed   INFLUENZA VACCINE  Completed   Hepatitis C Screening  Completed   Zoster Vaccines- Shingrix  Completed   HPV VACCINES  Aged Out   Colonoscopy  Discontinued   Health Maintenance Review LIFESTYLE:  Exercise:  yes Tobacco/ETS: n Alcohol:  1 per day  Sugar beverages: Sleep:   10 - 7 or such  Drug use: no HH of  2  no pet    ROS:  GEN REST of 12 system review negative except as per HPI   Past Medical History:  Diagnosis Date   Anemia    Back pain    Fibroids    uterine   Hay fever    Hx of colonoscopy    utd   Hx of transfusion    w gall baldder surgery  donates blood ( thus eg screen)   Hx of varicella    as a child   Hypertension    IBS (irritable bowel syndrome)    WBC decreased     Past  Surgical History:  Procedure Laterality Date   BLADDER REPAIR W/ CESAREAN SECTION  01/14/1985   GALLBLADDER SURGERY  01/15/1975   left knee susrgery Left    cartilage   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/2001   for fibroids and endometriosis    Family History  Problem Relation Age of Onset   Arthritis Mother    Obstructive Sleep Apnea Mother        on CPAP   Cancer Father        bladder   Hyperlipidemia Father    Migraines Sister    Hyperlipidemia Sister        low wbc   Dementia Other        older aunt uncle gm 21s and 65s    Social History   Socioeconomic History   Marital status: Married    Spouse name: Not on file   Number of children: Not on file   Years of education: Not on file   Highest education level:  Doctorate  Occupational History   Not on file  Tobacco Use   Smoking status: Never   Smokeless tobacco: Never  Vaping Use   Vaping status: Never Used  Substance and Sexual Activity   Alcohol use: Yes    Alcohol/week: 1.0 standard drink of alcohol    Types: 1 Glasses of wine per week   Drug use: No   Sexual activity: Not on file  Other Topics Concern   Not on file  Social History Narrative   Never Smoked   Alcohol use-yes about 2 per day red wine   Regular Exercise-yes   Drug use- no   hh of 2    Pet dog   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 about   orig from [place]   Childbirth Hx   Social Drivers of Health   Financial Resource Strain: Low Risk  (01/07/2023)   Overall Financial Resource Strain (CARDIA)    Difficulty of Paying Living Expenses: Not hard at all  Food Insecurity: No Food Insecurity (01/07/2023)   Hunger Vital Sign    Worried About Running Out of Food in the Last Year: Never true    Ran Out of Food in the Last Year: Never true  Transportation Needs: No Transportation Needs (01/07/2023)   PRAPARE - Administrator, Civil Service  (Medical): No    Lack of Transportation (Non-Medical): No  Physical Activity: Sufficiently Active (01/07/2023)   Exercise Vital Sign    Days of Exercise per Week: 5 days    Minutes of Exercise per Session: 90 min  Stress: No Stress Concern Present (01/07/2023)   [employer] of Occupational Health - Occupational Stress Questionnaire    Feeling of Stress : Only a little  Social Connections: Moderately Integrated (01/07/2023)   Social Connection and Isolation Panel [NHANES]    Frequency of Communication with Friends and Family: More than three times a week    Frequency of Social Gatherings with Friends and Family: Once a week    Attends Religious Services: 1 to 4 times per year    Active Member of [employer] or Organizations: No    Attends Banker Meetings: Never    Marital Status: Married    Outpatient Medications Prior to Visit  Medication Sig Dispense Refill   vitamin B-12 (CYANOCOBALAMIN) 100 MCG tablet Take 100 mcg by mouth daily.     donepezil (ARICEPT) 10 MG tablet Take 1 tablet (10 mg total) by mouth daily. 90 tablet 0   losartan-hydrochlorothiazide (HYZAAR) 100-25 MG tablet TAKE 1 TABLET BY MOUTH DAILY 90 tablet 1   No facility-administered medications prior to visit.     EXAM:  BP 106/66 (BP Location: Left Arm, Patient Position: Sitting, Cuff Size: Normal)   Pulse 66   Temp 97.6 F (36.4 C) (Oral)   Ht 5' 3.98" (1.625 m)   Wt 154 lb 12.8 oz (70.2 kg)   SpO2 98%   BMI 26.59 kg/m   Body mass index is 26.59 kg/m. Wt Readings from Last 3 Encounters:  01/09/23 154 lb 12.8 oz (70.2 kg)  10/15/22 152 lb (68.9 kg)  04/26/22 152 lb (68.9 kg)    Physical Exam: Vital signs reviewed ZOX:WRUE is a well-developed well-nourished alert cooperative    who appearsr stated age in no acute distress.  HEENT: normocephalic atraumatic , Eyes: PERRL EOM's full, conjunctiva clear, Nares: paten,t no deformity discharge or tenderness., Ears:  no deformity EAC's clear TMs  with normal landmarks. Mouth: clear OP, no lesions, edema.  Moist mucous membranes. Dentition in adequate repair. NECK: supple without masses, thyromegaly or bruits. CHEST/PULM:  Clear to auscultation and percussion breath sounds equal no wheeze , rales or rhonchi. No chest wall deformities or tenderness. Breast: normal by inspection . No dimpling, discharge, masses, tenderness or discharge . CV: PMI is nondisplaced, S1 S2 no gallops, murmurs, rubs. Peripheral pulses are full without delay.No JVD .  ABDOMEN: Bowel sounds normal nontender  No guard or rebound, no hepato splenomegal no CVA tenderness.  Extremtities:  No clubbing cyanosis or edema, no acute joint swelling or redness no focal atrophy NEURO:  Oriented x3, cranial nerves 3-12 appear to be intact, no obvious focal weakness,gait within normal limits no abnormal reflexes or asymmetrical SKIN: No acute rashes normal turgor, color, no bruising or petechiae. PSYCH: Oriented, good eye contact, no obvious depression anxiety, cognition and judgment appear grossly normal. Not tested  LN: no cervical axillary adenopathy  Lab Results  Component Value Date   WBC 3.4 (L) 01/02/2023   HGB 13.9 01/02/2023   HCT 40.7 01/02/2023   PLT 244.0 01/02/2023   GLUCOSE 108 (H) 01/02/2023   CHOL 210 (H) 01/02/2023   TRIG 78.0 01/02/2023   HDL 69.00 01/02/2023   LDLCALC 126 (H) 01/02/2023   ALT 26 01/02/2023   AST 20 01/02/2023   NA 139 01/02/2023   K 3.7 01/02/2023   CL 103 01/02/2023   CREATININE 0.85 01/02/2023   BUN 16 01/02/2023   CO2 27 01/02/2023   TSH 2.10 01/02/2023   INR 1.0 04/26/2019   HGBA1C 6.0 01/02/2023  The 10-year ASCVD risk score (Arnett DK, et al., 2019) is: 7.6%   Values used to calculate the score:     Age: 8 years     Sex: Female     Is Non-Hispanic African American: Yes     Diabetic: No     Tobacco smoker: No     Systolic Blood Pressure: 106 mmHg     Is BP treated: Yes     HDL Cholesterol: 69 mg/dL     Total  Cholesterol: 210 mg/dL   BP Readings from Last 3 Encounters:  01/09/23 106/66  12/12/21 124/86  02/14/21 120/68    Lab results reviewed with patient   ASSESSMENT AND PLAN:  Discussed the following assessment and plan:    ICD-10-CM   1. Visit for preventive health examination  Z00.00     2. Essential hypertension  I10     3. Medication management  Z79.899     4. Cognitive decline  R41.89     5. Screening for osteoporosis  Z13.820 DG Bone Density    6. Estrogen deficiency  E28.39 DG Bone Density    Disc risk reduction  and I agree that she understands   risk benefit and at this time  will follow and attend to  [facility] as possible.  BP control  Would want her to be back to the [facility] neuro  team for med management and follow . Return in about 1 year (around 01/09/2024).  Patient Care Team: Madelin Headings, MD as PCP - General Patient Instructions  Good  to see you today  will order a bridge therapy rx for aricept.   Will work on getting  you back to Appt with [facility]  neurology specialist.    Can consider  ct calcium scan  for risk assessment  for future cv  sx prevention.  For statin medication. Consideration  but unsure of risk benefit.    Stacey Young. Stacey Young M.D.

## 2023-01-09 NOTE — Patient Instructions (Addendum)
Good  to see you today  will order a bridge therapy rx for aricept.   Will work on getting  you back to Appt with [facility]  neurology specialist.    Can consider  ct calcium scan  for risk assessment  for future cv sx prevention.  For statin medication. Consideration  but unsure of risk benefit.   Stacey Young          Stacey Young

## 2023-01-09 NOTE — Telephone Encounter (Signed)
Contacted Dr. Jimmye Norman [facility] office. Spoke to [employer] [employer].   He reports they didn't received an urgent referral sent from 12/25/2022.   Ask for [place] to resend to the fax number [phone].   Can you help with this?

## 2023-01-31 ENCOUNTER — Inpatient Hospital Stay: Admission: RE | Admit: 2023-01-31 | Payer: Medicare Other | Source: Ambulatory Visit

## 2023-02-06 ENCOUNTER — Other Ambulatory Visit: Payer: Self-pay | Admitting: Internal Medicine

## 2023-02-06 DIAGNOSIS — Z1231 Encounter for screening mammogram for malignant neoplasm of breast: Secondary | ICD-10-CM

## 2023-02-24 ENCOUNTER — Ambulatory Visit (INDEPENDENT_AMBULATORY_CARE_PROVIDER_SITE_OTHER): Payer: Medicare Other | Admitting: Family Medicine

## 2023-02-24 ENCOUNTER — Encounter: Payer: Self-pay | Admitting: Family Medicine

## 2023-02-24 VITALS — BP 120/80 | HR 51 | Temp 98.1°F | Ht 63.98 in | Wt 150.0 lb

## 2023-02-24 DIAGNOSIS — R059 Cough, unspecified: Secondary | ICD-10-CM | POA: Diagnosis not present

## 2023-02-24 DIAGNOSIS — J019 Acute sinusitis, unspecified: Secondary | ICD-10-CM

## 2023-02-24 MED ORDER — DOXYCYCLINE HYCLATE 100 MG PO TABS: 100.0000 mg | ORAL_TABLET | Freq: Two times a day (BID) | ORAL | 0 refills | Status: AC

## 2023-02-24 NOTE — Progress Notes (Signed)
 Acute Office Visit   Subjective:  Patient ID: Stacey Young, female    DOB: January 26, 1955, 67 y.o.   MRN: [id number]  Chief Complaint  Patient presents with   Cough    Cough congestion, runny nose, started 5 days ago, patient denies any fevers     Cough   Patient is complaining of a productive cough, unknown color and consistency, nasal congestion with a little nasal drainage, hoarseness, and scratchy throat.Denies ear ache or drainage, headache, fever, chest pain, shortness of breath, nausea or vomiting, dizziness or lightheadedness. Patient reports symptoms started 5 days ago. She reports she feels like she was getting better yesterday, but worse today. She denies taking medication for symptoms.  Review of Systems  Respiratory:  Positive for cough.    See HPI above      Objective:   BP 120/80 (BP Location: Left Arm, Patient Position: Sitting, Cuff Size: Normal)   Pulse (!) 51   Temp 98.1 F (36.7 C) (Oral)   Ht 5' 3.98" (1.625 m)   Wt 150 lb (68 kg)   SpO2 97%   BMI 25.76 kg/m    Physical Exam Vitals reviewed.  Constitutional:      General: She is not in acute distress.    Appearance: Normal appearance. She is ill-appearing (Mild). She is not toxic-appearing or diaphoretic.  HENT:     Head: Normocephalic and atraumatic.     Right Ear: Tympanic membrane, ear canal and external ear normal. There is no impacted cerumen.     Ears:     Comments: Moderate amount of cerumen in left canal    Nose: Congestion present.     Right Sinus: No maxillary sinus tenderness or frontal sinus tenderness.     Left Sinus: No maxillary sinus tenderness or frontal sinus tenderness.     Mouth/Throat:     Pharynx: Oropharynx is clear. Uvula midline. No pharyngeal swelling, oropharyngeal exudate, posterior oropharyngeal erythema or uvula swelling.     Comments: Hoarse Eyes:     General:        Right eye: No discharge.        Left eye: No discharge.     Conjunctiva/sclera: Conjunctivae  normal.  Cardiovascular:     Rate and Rhythm: Normal rate and regular rhythm.     Heart sounds: Normal heart sounds. No murmur heard.    No friction rub. No gallop.  Pulmonary:     Effort: Pulmonary effort is normal. No respiratory distress.     Breath sounds: Normal breath sounds.  Musculoskeletal:        General: Normal range of motion.  Skin:    General: Skin is warm and dry.  Neurological:     General: No focal deficit present.     Mental Status: She is alert and oriented to person, place, and time. Mental status is at baseline.  Psychiatric:        Mood and Affect: Mood normal.        Behavior: Behavior normal. Behavior is cooperative.        Thought Content: Thought content normal.        Judgment: Judgment normal.        Assessment & Plan:  Acute sinusitis, recurrence not specified, unspecified location -     Doxycycline  Hyclate; Take 1 tablet (100 mg total) by mouth 2 (two) times daily for 7 days.  Dispense: 14 tablet; Refill: 0  Cough, unspecified type -     Doxycycline  Hyclate; Take  1 tablet (100 mg total) by mouth 2 (two) times daily for 7 days.  Dispense: 14 tablet; Refill: 0  -Prescribed Doxycycline  100mg  tablet, take 1 tablet twice a day for 7 days.  -Recommend to take over the counter Mucinex for cough.  -Rest, hydrate.  -If not improved after taking antibiotic or symptoms become severe with fever, nausea, vomiting, or shortness of breath. Follow up sooner.  -Provided information about sinus infection and cough.   Amery Minasyan, NP

## 2023-02-24 NOTE — Patient Instructions (Signed)
-  Prescribed Doxycycline  100mg  tablet, take 1 tablet twice a day for 7 days.  -Recommend to take over the counter Mucinex for cough.  -Rest, hydrate.  -If not improved after taking antibiotic or symptoms become severe with fever, nausea, vomiting, or shortness of breath. Follow up sooner.

## 2023-02-27 ENCOUNTER — Ambulatory Visit: Payer: Medicare Other

## 2023-03-07 ENCOUNTER — Ambulatory Visit: Payer: Medicare Other

## 2023-03-27 ENCOUNTER — Ambulatory Visit
Admission: RE | Admit: 2023-03-27 | Discharge: 2023-03-27 | Disposition: A | Discharge status: Home / Self Care | Source: Ambulatory Visit | Attending: Internal Medicine | Admitting: Internal Medicine

## 2023-03-27 DIAGNOSIS — Z1231 Encounter for screening mammogram for malignant neoplasm of breast: Secondary | ICD-10-CM

## 2023-04-20 ENCOUNTER — Other Ambulatory Visit: Payer: Self-pay | Admitting: Internal Medicine

## 2023-06-05 DIAGNOSIS — K080 Exfoliation of teeth due to systemic causes: Secondary | ICD-10-CM | POA: Diagnosis not present

## 2023-06-27 DIAGNOSIS — F0280 Dementia in other diseases classified elsewhere without behavioral disturbance: Secondary | ICD-10-CM | POA: Diagnosis not present

## 2023-06-27 DIAGNOSIS — H547 Unspecified visual loss: Secondary | ICD-10-CM | POA: Diagnosis not present

## 2023-06-27 DIAGNOSIS — G319 Degenerative disease of nervous system, unspecified: Secondary | ICD-10-CM | POA: Diagnosis not present

## 2023-06-27 DIAGNOSIS — Z1331 Encounter for screening for depression: Secondary | ICD-10-CM | POA: Diagnosis not present

## 2023-07-15 ENCOUNTER — Other Ambulatory Visit: Payer: Self-pay | Admitting: Internal Medicine

## 2023-08-19 ENCOUNTER — Encounter (INDEPENDENT_AMBULATORY_CARE_PROVIDER_SITE_OTHER): Admitting: Family Medicine

## 2023-08-19 NOTE — Progress Notes (Unsigned)
 Per staff pt canceled appt.

## 2023-09-09 ENCOUNTER — Telehealth: Payer: Self-pay

## 2023-09-09 NOTE — Telephone Encounter (Signed)
 Copied from CRM [id number]. Topic: Clinical - Request for Lab/Test Order >> Sep 09, 2023 11:53 AM Laymon HERO wrote: Reason for CRM: patient asking for orders for lab work a week before the physical appt on 12/31

## 2023-09-16 ENCOUNTER — Encounter: Payer: Self-pay | Admitting: Family Medicine

## 2023-09-16 ENCOUNTER — Ambulatory Visit (INDEPENDENT_AMBULATORY_CARE_PROVIDER_SITE_OTHER): Admitting: Family Medicine

## 2023-09-16 DIAGNOSIS — Z0000 Encounter for general adult medical examination without abnormal findings: Secondary | ICD-10-CM

## 2023-09-16 NOTE — Progress Notes (Signed)
 PATIENT CHECK-IN and HEALTH RISK ASSESSMENT QUESTIONNAIRE:  -completed by phone/video for upcoming Medicare Preventive Visit  Pre-Visit Check-in: 1)Vitals (height, wt, BP, etc) - record in vitals section for visit on day of visit Request home vitals (wt, BP, etc.) and enter into vitals, THEN update Vital Signs SmartPhrase below at the top of the HPI. See below.  2)Review and Update Medications, Allergies PMH, Surgeries, Social history in Epic 3)Hospitalizations in the last year with date/reason? NO   4)Review and Update Care Team (patient's specialists) in Epic 5) Complete PHQ9 in Epic  6) Complete Fall Screening in Epic 7)Review all Health Maintenance Due and order if not done.  Medicare Wellness Patient Questionnaire:  Answer theses question about your habits: How often do you have a drink containing alcohol?7 days a week  How many drinks containing alcohol do you have on a typical day when you are drinking?1 glass How often do you have six or more drinks on one occasion?NO  Have you ever smoked?NO  Quit date if applicable? Na  How many packs a day do/did you smoke? Na Do you use smokeless tobacco?No Do you use an illicit drugs?NO  On average, how many days per week do you engage in moderate to strenuous exercise (like a brisk walk)?everyday  On average, how many minutes do you engage in exercise at this level? 1.5 hours, walks outside Are you sexually active? Yes Number of partners? Typical breakfast: cereal, eggs Typical lunch:Sandwich  Typical dinner:Meat and potatoes and Vegetable  Typical snacks:Fruit, nuts   Beverages: water and tea   Answer theses question about your everyday activities: Can you perform most household chores?Yes  Are you deaf or have significant trouble hearing?NO  Do you feel that you have a problem with memory?yes  Do you feel safe at home?Yes  Last dentist visit?5 Months ago  8. Do you have any difficulty performing your everyday activities?No  Are  you having any difficulty walking, taking medications on your own, and or difficulty managing daily home needs?NO  Do you have difficulty walking or climbing stairs?NO  Do you have difficulty dressing or bathing?NO  Do you have difficulty doing errands alone such as visiting a doctor's office or shopping? Yes patient does not drive  Do you currently have any difficulty preparing food and eating?NO  Do you currently have any difficulty using the toilet?NO  Do you have any difficulty managing your finances?Yes - has helo Do you have any difficulties with housekeeping of managing your housekeeping?No    Do you have Advanced Directives in place (Living Will, Healthcare Power or Attorney)? Yes    Last eye Exam and location?year ago, Unknown    Do you currently use prescribed or non-prescribed narcotic or opioid pain medications?NO   Do you have a history or close family history of breast, ovarian, tubal or peritoneal cancer or a family member with BRCA (breast cancer susceptibility 1 and 2) gene mutations?NO    Nurse/Assistant Credentials/time stamp: Leah A.Wright CMA 12:00pm    ----------------------------------------------------------------------------------------------------------------------------------------------------------------------------------------------------------------------  Because this visit was a virtual/telehealth visit, some criteria may be missing or patient reported. Any vitals not documented were not able to be obtained and vitals that have been documented are patient reported.    MEDICARE ANNUAL PREVENTIVE VISIT WITH PROVIDER: (Welcome to Medicare, initial annual wellness or annual wellness exam)  Virtual Visit via Phone Note  I connected with Suanne B Diperna on 09/16/23 by phone and verified that I am speaking with the correct person using two identifiers.  Location patient: home Location provider:work or home office Persons participating in the virtual  visit: patient, provider  Concerns and/or follow up today: no concerns   See HM section in Epic for other details of completed HM.    ROS: negative for report of fevers, unintentional weight loss, vision changes, vision loss, hearing loss or change, chest pain, sob, hemoptysis, melena, hematochezia, hematuria, falls, bleeding or bruising, thoughts of suicide or self harm, memory loss  Patient-completed extensive health risk assessment - reviewed and discussed with the patient: See Health Risk Assessment completed with patient prior to the visit either above or in recent phone note. This was reviewed in detailed with the patient today and appropriate recommendations, orders and referrals were placed as needed per Summary below and patient instructions.   Review of Medical History: -PMH, PSH, Family History and current specialty and care providers reviewed and updated and listed below   Patient Care Team: Charlett Apolinar POUR, MD as PCP - General   Past Medical History:  Diagnosis Date   Anemia    Back pain    Fibroids    uterine   Hay fever    Hx of colonoscopy    utd   Hx of transfusion    w gall baldder surgery  donates blood ( thus eg screen)   Hx of varicella    as a child   Hypertension    IBS (irritable bowel syndrome)    WBC decreased     Past Surgical History:  Procedure Laterality Date   BLADDER REPAIR W/ CESAREAN SECTION  01/14/1985   GALLBLADDER SURGERY  01/15/1975   left knee susrgery Left    cartilage   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/2001   for fibroids and endometriosis    Social History   Socioeconomic History   Marital status: Married    Spouse name: Not on file   Number of children: Not on file   Years of education: Not on file   Highest education level: Doctorate  Occupational History   Not on file  Tobacco Use   Smoking status: Never   Smokeless tobacco: Never  Vaping Use   Vaping status: Never Used  Substance and Sexual  Activity   Alcohol use: Yes    Alcohol/week: 1.0 standard drink of alcohol    Types: 1 Glasses of wine per week   Drug use: No   Sexual activity: Not on file  Other Topics Concern   Not on file  Social History Narrative   Never Smoked   Alcohol use-yes about 2 per day red wine   Regular Exercise-yes   Drug use- no   hh of 2    Pet dog   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 about   orig from [place]    Childbirth Hx   Social Drivers of Corporate investment banker Strain: Low Risk  (01/07/2023)   Overall Financial Resource Strain (CARDIA)    Difficulty of Paying Living Expenses: Not hard at all  Food Insecurity: No Food Insecurity (01/07/2023)   Hunger Vital Sign    Worried About Running Out of Food in the Last Year: Never true    Ran Out of Food in the Last Year: Never true  Transportation Needs: No Transportation Needs (01/07/2023)   PRAPARE - Administrator, Civil Service (Medical): No    Lack of Transportation (Non-Medical): No  Physical Activity: Sufficiently Active (01/07/2023)   Exercise Vital Sign    Days of Exercise per Week: 5 days    Minutes of Exercise per Session: 90 min  Stress: No Stress Concern Present (01/07/2023)   [employer] of Occupational Health - Occupational Stress Questionnaire    Feeling of Stress : Only a little  Social Connections: Moderately Integrated (01/07/2023)   Social Connection and Isolation Panel    Frequency of Communication with Friends and Family: More than three times a week    Frequency of Social Gatherings with Friends and Family: Once a week    Attends Religious Services: 1 to 4 times per year    Active Member of [employer] or Organizations: No    Attends Engineer, structural: Not on file    Marital Status: Married  Catering manager Violence: Not At Risk (04/26/2022)   Humiliation, Afraid, Rape, and Kick questionnaire    Fear of  Current or Ex-Partner: No    Emotionally Abused: No    Physically Abused: No    Sexually Abused: No    Family History  Problem Relation Age of Onset   Arthritis Mother    Obstructive Sleep Apnea Mother        on CPAP   Cancer Father        bladder   Hyperlipidemia Father    Migraines Sister    Hyperlipidemia Sister        low wbc   Dementia Other        older aunt uncle gm 27s and 30s   Breast cancer Neg Hx     Current Outpatient Medications on File Prior to Visit  Medication Sig Dispense Refill   donepezil  (ARICEPT ) 10 MG tablet TAKE 1 TABLET BY MOUTH EVERY DAY 90 tablet 0   losartan -hydrochlorothiazide (HYZAAR) 100-25 MG tablet TAKE 1 TABLET BY MOUTH DAILY 90 tablet 1   vitamin B-12 (CYANOCOBALAMIN ) 100 MCG tablet Take 100 mcg by mouth daily.     No current facility-administered medications on file prior to visit.    No Known Allergies     Physical Exam Vitals requested from patient and listed below if patient had equipment and was able to obtain at home for this virtual visit: There were no vitals filed for this visit. Estimated body mass index is 25.76 kg/m as calculated from the following:   Height as of 02/24/23: 5' 3.98 (1.625 m).   Weight as of 02/24/23: 150 lb (68 kg).  EKG (optional): deferred due to virtual visit  GENERAL: alert, oriented, no acute distress detected, full vision exam deferred due to pandemic and/or virtual encounter  PSYCH/NEURO: pleasant and cooperative, no obvious depression or anxiety, speech and thought processing grossly intact, Cognitive function grossly intact  Flowsheet Row Office Visit from 09/16/2023 in [facility]  PHQ-9 Total Score 0        09/16/2023   11:50 AM 04/29/2022    8:03 AM 12/12/2021   10:06 AM 06/12/2021    9:53 AM 12/11/2020    1:44 PM  Depression screen PHQ 2/9  Decreased Interest 0 0 0 0 0  Down, Depressed, Hopeless 0 0 0 0 0  PHQ - 2 Score 0 0 0 0 0  Altered sleeping 0  0     Tired, decreased energy 0  0    Change in appetite 0  0    Feeling bad or failure about yourself  0  0    Trouble concentrating  0  0    Moving slowly or fidgety/restless 0  0    Suicidal thoughts 0  0    PHQ-9 Score 0  0         06/12/2021    9:57 AM 01/03/2022    8:03 AM 04/26/2022    1:16 PM 02/24/2023   11:36 AM 09/16/2023   11:49 AM  Fall Risk  Falls in the past year? 0 0 0 0 0  Was there an injury with Fall? 0 0 0 0 0  Fall Risk Category Calculator 0 0 0 0 0  Fall Risk Category (Retired) Low  Low      (RETIRED) Patient Fall Risk Level Low fall risk  Low fall risk      Patient at Risk for Falls Due to No Fall Risks No Fall Risks No Fall Risks No Fall Risks No Fall Risks  Fall risk Follow up Falls prevention discussed  Falls evaluation completed  Falls prevention discussed Falls evaluation completed Falls evaluation completed     Data saved with a previous flowsheet row definition     SUMMARY AND PLAN:  Medicare annual wellness visit, subsequent   Discussed applicable health maintenance/preventive health measures and advised and referred or ordered per patient preferences: -she declined bone density test -says she will get vaccines at the pharmacy Health Maintenance  Topic Date Due   INFLUENZA VACCINE  08/15/2023   COVID-19 Vaccine (7 - Pfizer risk 2024-25 season) 10/02/2023 (Originally 09/15/2023)   DEXA SCAN  09/15/2024 (Originally 06/08/2019)   Fecal DNA (Cologuard)  01/16/2024   Medicare Annual Wellness (AWV)  09/15/2024   MAMMOGRAM  03/26/2025   DTaP/Tdap/Td (4 - Td or Tdap) 05/11/2031   Pneumococcal Vaccine: 50+ Years  Completed   Hepatitis C Screening  Completed   Zoster Vaccines- Shingrix  Completed   HPV VACCINES  Aged Out   Meningococcal B Vaccine  Aged Out   Colonoscopy  Discontinued      Education and counseling on the following was provided based on the above review of health and a plan/checklist for the patient, along with additional information  discussed, was provided for the patient in the patient instructions :   -Advised and counseled on a healthy lifestyle  -Reviewed patient's current diet. A summary of a healthy diet was provided in the Patient Instructions.  -reviewed patient's current physical activity level and discussed exercise guidelines for adults. Congratulated on walking/healthy habits. Discussed ideas for safe exercise at home to assist in meeting exercise guideline recommendations in a safe and healthy way.  -Advise yearly dental visits at minimum and regular eye exams    Follow up: see patient instructions     Patient Instructions  I really enjoyed getting to talk with you today! I am available on Tuesdays and Thursdays for virtual visits if you have any questions or concerns, or if I can be of any further assistance.   CHECKLIST FROM ANNUAL WELLNESS VISIT:  -Follow up (please call to schedule if not scheduled after visit):   -yearly for annual wellness visit with primary care office  Here is a list of your preventive care/health maintenance measures and the plan for each if any are due:  PLAN For any measures below that may be due:    1. Can get the vaccines at the pharmacy as we discussed. Please provide proof of receipt to Dr. Charlett when you do so that we can update your record here.   2. Let us  know if  you decide to do a bone density test.   Health Maintenance  Topic Date Due   INFLUENZA VACCINE  08/15/2023   COVID-19 Vaccine (7 - Pfizer risk 2024-25 season) 10/02/2023 (Originally 09/15/2023)   DEXA SCAN  09/15/2024 (Originally 06/08/2019)   Fecal DNA (Cologuard)  01/16/2024   Medicare Annual Wellness (AWV)  09/15/2024   MAMMOGRAM  03/26/2025   DTaP/Tdap/Td (4 - Td or Tdap) 05/11/2031   Pneumococcal Vaccine: 50+ Years  Completed   Hepatitis C Screening  Completed   Zoster Vaccines- Shingrix  Completed   HPV VACCINES  Aged Out   Meningococcal B Vaccine  Aged Out   Colonoscopy  Discontinued     -See a dentist at least yearly  -Get your eyes checked and then per your eye specialist's recommendations  -Other issues addressed today:   -I have included below further information regarding a healthy whole foods based diet, physical activity guidelines for adults, stress management and opportunities for social connections. I hope you find this information useful.   -----------------------------------------------------------------------------------------------------------------------------------------------------------------------------------------------------------------------------------------------------------    NUTRITION: -eat real food: lots of colorful vegetables (half the plate) and fruits -5-7 servings of vegetables and fruits per day (fresh or steamed is best), exp. 2 servings of vegetables with lunch and dinner and 2 servings of fruit per day. Berries and greens such as kale and collards are great choices.  -consume on a regular basis:  fresh fruits, fresh veggies, fish, nuts, seeds, healthy oils (such as olive oil, avocado oil), whole grains (make sure for bread/pasta/crackers/etc., that the first ingredient on label contains the word whole), legumes. -can eat small amounts of dairy and lean meat (no larger than the palm of your hand), but avoid processed meats such as ham, bacon, lunch meat, etc. -drink water -try to avoid fast food and pre-packaged foods, processed meat, ultra processed foods/beverages (donuts, candy, etc.) -most experts advise limiting sodium to < 2300mg  per day, should limit further is any chronic conditions such as high blood pressure, heart disease, diabetes, etc. The American Heart Association advised that < 1500mg  is is ideal -try to avoid foods/beverages that contain any ingredients with names you do not recognize  -try to avoid foods/beverages  with added sugar or sweeteners/sweets  -try to avoid sweet drinks (including diet drinks): soda,  juice, Gatorade, sweet tea, power drinks, diet drinks -try to avoid white rice, white bread, pasta (unless whole grain)  EXERCISE GUIDELINES FOR ADULTS: -if you wish to increase your physical activity, do so gradually and with the approval of your doctor -STOP and seek medical care immediately if you have any chest pain, chest discomfort or trouble breathing when starting or increasing exercise  -move and stretch your body, legs, feet and arms when sitting for long periods -Physical activity guidelines for optimal health in adults: -get at least 150 minutes per week of moderate exercise (can talk, but not sing); this is about 20-30 minutes of sustained activity 5-7 days per week or two 10-15 minute episodes of sustained activity 5-7 days per week -do some muscle building/resistance training/strength training at least 2 days per week  -balance exercises 3+ days per week:   Stand somewhere where you have something sturdy to hold onto if you lose balance    1) lift up on toes, then back down, start with 5x per day and work up to 20x   2) stand and lift one leg straight out to the side so that foot is a few inches of the floor, start with 5x  each side and work up to 20x each side   3) stand on one foot, start with 5 seconds each side and work up to 20 seconds on each side  If you need ideas or help with getting more active:  -Silver sneakers https://tools.silversneakers.com  -Walk with a Doc: http://www.duncan-williams.com/  -try to include resistance (weight lifting/strength building) and balance exercises twice per week: or the following link for ideas: http://castillo-powell.com/  BuyDucts.dk  STRESS MANAGEMENT: -can try meditating, or just sitting quietly with deep breathing while intentionally relaxing all parts of your body for 5 minutes daily -if you need further help with stress, anxiety or  depression please follow up with your primary doctor or contact the wonderful folks at [facility]: [phone]  SOCIAL CONNECTIONS: -options in [place] if you wish to engage in more social and exercise related activities:  -Silver sneakers https://tools.silversneakers.com  -Walk with a Doc: http://www.duncan-williams.com/  -Check out the [facility] Active Adults 50+ section on the [place] of [employer] (hiking clubs, book clubs, cards and games, chess, exercise classes, aquatic classes and much more) - see the website for details: https://www.[place].gov/departments/parks-recreation/active-adults50  -YouTube has lots of exercise videos for different ages and abilities as well  -[facility] (a variety of indoor and outdoor inperson activities for adults). [phone]. [address]  -Virtual Online Classes (a variety of topics): see seniorplanet.org or call [phone]  -consider volunteering at a school, hospice center, church, senior center or elsewhere            Chiquita JONELLE Cramp, DO

## 2023-09-16 NOTE — Patient Instructions (Signed)
 I really enjoyed getting to talk with you today! I am available on Tuesdays and Thursdays for virtual visits if you have any questions or concerns, or if I can be of any further assistance.   CHECKLIST FROM ANNUAL WELLNESS VISIT:  -Follow up (please call to schedule if not scheduled after visit):   -yearly for annual wellness visit with primary care office  Here is a list of your preventive care/health maintenance measures and the plan for each if any are due:  PLAN For any measures below that may be due:    1. Can get the vaccines at the pharmacy as we discussed. Please provide proof of receipt to Dr. Charlett when you do so that we can update your record here.   2. Let us  know if you decide to do a bone density test.   Health Maintenance  Topic Date Due   INFLUENZA VACCINE  08/15/2023   COVID-19 Vaccine (7 - Pfizer risk 2024-25 season) 10/02/2023 (Originally 09/15/2023)   DEXA SCAN  09/15/2024 (Originally 06/08/2019)   Fecal DNA (Cologuard)  01/16/2024   Medicare Annual Wellness (AWV)  09/15/2024   MAMMOGRAM  03/26/2025   DTaP/Tdap/Td (4 - Td or Tdap) 05/11/2031   Pneumococcal Vaccine: 50+ Years  Completed   Hepatitis C Screening  Completed   Zoster Vaccines- Shingrix  Completed   HPV VACCINES  Aged Out   Meningococcal B Vaccine  Aged Out   Colonoscopy  Discontinued    -See a dentist at least yearly  -Get your eyes checked and then per your eye specialist's recommendations  -Other issues addressed today:   -I have included below further information regarding a healthy whole foods based diet, physical activity guidelines for adults, stress management and opportunities for social connections. I hope you find this information useful.    -----------------------------------------------------------------------------------------------------------------------------------------------------------------------------------------------------------------------------------------------------------    NUTRITION: -eat real food: lots of colorful vegetables (half the plate) and fruits -5-7 servings of vegetables and fruits per day (fresh or steamed is best), exp. 2 servings of vegetables with lunch and dinner and 2 servings of fruit per day. Berries and greens such as kale and collards are great choices.  -consume on a regular basis:  fresh fruits, fresh veggies, fish, nuts, seeds, healthy oils (such as olive oil, avocado oil), whole grains (make sure for bread/pasta/crackers/etc., that the first ingredient on label contains the word whole), legumes. -can eat small amounts of dairy and lean meat (no larger than the palm of your hand), but avoid processed meats such as ham, bacon, lunch meat, etc. -drink water -try to avoid fast food and pre-packaged foods, processed meat, ultra processed foods/beverages (donuts, candy, etc.) -most experts advise limiting sodium to < 2300mg  per day, should limit further is any chronic conditions such as high blood pressure, heart disease, diabetes, etc. The American Heart Association advised that < 1500mg  is is ideal -try to avoid foods/beverages that contain any ingredients with names you do not recognize  -try to avoid foods/beverages  with added sugar or sweeteners/sweets  -try to avoid sweet drinks (including diet drinks): soda, juice, Gatorade, sweet tea, power drinks, diet drinks -try to avoid white rice, white bread, pasta (unless whole grain)  EXERCISE GUIDELINES FOR ADULTS: -if you wish to increase your physical activity, do so gradually and with the approval of your doctor -STOP and seek medical care immediately if you have any chest pain, chest discomfort or trouble breathing when starting or  increasing exercise  -move and stretch your body, legs, feet and  arms when sitting for long periods -Physical activity guidelines for optimal health in adults: -get at least 150 minutes per week of moderate exercise (can talk, but not sing); this is about 20-30 minutes of sustained activity 5-7 days per week or two 10-15 minute episodes of sustained activity 5-7 days per week -do some muscle building/resistance training/strength training at least 2 days per week  -balance exercises 3+ days per week:   Stand somewhere where you have something sturdy to hold onto if you lose balance    1) lift up on toes, then back down, start with 5x per day and work up to 20x   2) stand and lift one leg straight out to the side so that foot is a few inches of the floor, start with 5x each side and work up to 20x each side   3) stand on one foot, start with 5 seconds each side and work up to 20 seconds on each side  If you need ideas or help with getting more active:  -Silver sneakers https://tools.silversneakers.com  -Walk with a Doc: http://www.duncan-williams.com/  -try to include resistance (weight lifting/strength building) and balance exercises twice per week: or the following link for ideas: http://castillo-powell.com/  BuyDucts.dk  STRESS MANAGEMENT: -can try meditating, or just sitting quietly with deep breathing while intentionally relaxing all parts of your body for 5 minutes daily -if you need further help with stress, anxiety or depression please follow up with your primary doctor or contact the wonderful folks at [facility]: [phone]  SOCIAL CONNECTIONS: -options in [place] if you wish to engage in more social and exercise related activities:  -Silver sneakers https://tools.silversneakers.com  -Walk with a Doc: http://www.duncan-williams.com/  -Check out the [facility] Active Adults 50+  section on the [place] [employer] (hiking clubs, book clubs, cards and games, chess, exercise classes, aquatic classes and much more) - see the website for details: https://www.[place].gov/departments/parks-recreation/active-adults50  -YouTube has lots of exercise videos for different ages and abilities as well  -[facility] (a variety of indoor and outdoor inperson activities for adults). 814-032-0992. [address]  -Virtual Online Classes (a variety of topics): see seniorplanet.org or call 9738018124  -consider volunteering at a school, hospice center, church, senior center or elsewhere

## 2023-09-18 ENCOUNTER — Other Ambulatory Visit: Payer: Self-pay | Admitting: Internal Medicine

## 2023-09-18 DIAGNOSIS — R7309 Other abnormal glucose: Secondary | ICD-10-CM

## 2023-09-18 DIAGNOSIS — D709 Neutropenia, unspecified: Secondary | ICD-10-CM

## 2023-09-18 DIAGNOSIS — E538 Deficiency of other specified B group vitamins: Secondary | ICD-10-CM

## 2023-09-18 DIAGNOSIS — I10 Essential (primary) hypertension: Secondary | ICD-10-CM

## 2023-09-18 DIAGNOSIS — R4189 Other symptoms and signs involving cognitive functions and awareness: Secondary | ICD-10-CM

## 2023-09-18 DIAGNOSIS — Z79899 Other long term (current) drug therapy: Secondary | ICD-10-CM

## 2023-09-18 NOTE — Telephone Encounter (Signed)
 Orders have been placed for visit as requesed

## 2023-09-18 NOTE — Progress Notes (Signed)
  Lab Results  Component Value Date   WBC 3.4 (L) 01/02/2023   HGB 13.9 01/02/2023   HCT 40.7 01/02/2023   PLT 244.0 01/02/2023   GLUCOSE 108 (H) 01/02/2023   CHOL 210 (H) 01/02/2023   TRIG 78.0 01/02/2023   HDL 69.00 01/02/2023   LDLCALC 126 (H) 01/02/2023   ALT 26 01/02/2023   AST 20 01/02/2023   NA 139 01/02/2023   K 3.7 01/02/2023   CL 103 01/02/2023   CREATININE 0.85 01/02/2023   BUN 16 01/02/2023   CO2 27 01/02/2023   TSH 2.10 01/02/2023   INR 1.0 04/26/2019   HGBA1C 6.0 01/02/2023   Lab Results  Component Value Date   VITAMINB12 855 01/02/2023

## 2023-09-22 NOTE — Telephone Encounter (Signed)
 Spoke with pt's spouse. Appt is made for 01/09/2024.

## 2023-10-31 ENCOUNTER — Other Ambulatory Visit (HOSPITAL_BASED_OUTPATIENT_CLINIC_OR_DEPARTMENT_OTHER): Payer: Self-pay

## 2023-10-31 MED ORDER — FLUZONE HIGH-DOSE 0.5 ML IM SUSY
0.5000 mL | PREFILLED_SYRINGE | Freq: Once | INTRAMUSCULAR | 0 refills | Status: AC
  Filled 2023-10-31: qty 0.5, 1d supply, fill #0

## 2023-10-31 MED ORDER — COMIRNATY 30 MCG/0.3ML IM SUSY
0.3000 mL | PREFILLED_SYRINGE | Freq: Once | INTRAMUSCULAR | 0 refills | Status: AC
  Filled 2023-10-31: qty 0.3, 1d supply, fill #0

## 2024-01-07 ENCOUNTER — Other Ambulatory Visit (INDEPENDENT_AMBULATORY_CARE_PROVIDER_SITE_OTHER)

## 2024-01-07 DIAGNOSIS — I10 Essential (primary) hypertension: Secondary | ICD-10-CM | POA: Diagnosis not present

## 2024-01-07 DIAGNOSIS — D709 Neutropenia, unspecified: Secondary | ICD-10-CM

## 2024-01-07 DIAGNOSIS — R4189 Other symptoms and signs involving cognitive functions and awareness: Secondary | ICD-10-CM

## 2024-01-07 DIAGNOSIS — Z79899 Other long term (current) drug therapy: Secondary | ICD-10-CM

## 2024-01-07 DIAGNOSIS — E538 Deficiency of other specified B group vitamins: Secondary | ICD-10-CM

## 2024-01-07 DIAGNOSIS — R7309 Other abnormal glucose: Secondary | ICD-10-CM | POA: Diagnosis not present

## 2024-01-07 LAB — LIPID PANEL
Cholesterol: 201 mg/dL — ABNORMAL HIGH (ref 28–200)
HDL: 69 mg/dL
LDL Cholesterol: 110 mg/dL — ABNORMAL HIGH (ref 10–99)
NonHDL: 132.45
Total CHOL/HDL Ratio: 3
Triglycerides: 112 mg/dL (ref 10.0–149.0)
VLDL: 22.4 mg/dL (ref 0.0–40.0)

## 2024-01-07 LAB — CBC WITH DIFFERENTIAL/PLATELET
Basophils Absolute: 0 K/uL (ref 0.0–0.1)
Basophils Relative: 0.6 % (ref 0.0–3.0)
Eosinophils Absolute: 0 K/uL (ref 0.0–0.7)
Eosinophils Relative: 0.9 % (ref 0.0–5.0)
HCT: 42.3 % (ref 36.0–46.0)
Hemoglobin: 14.6 g/dL (ref 12.0–15.0)
Lymphocytes Relative: 44.3 % (ref 12.0–46.0)
Lymphs Abs: 2 K/uL (ref 0.7–4.0)
MCHC: 34.5 g/dL (ref 30.0–36.0)
MCV: 89.4 fl (ref 78.0–100.0)
Monocytes Absolute: 0.5 K/uL (ref 0.1–1.0)
Monocytes Relative: 11.7 % (ref 3.0–12.0)
Neutro Abs: 1.9 K/uL (ref 1.4–7.7)
Neutrophils Relative %: 42.5 % — ABNORMAL LOW (ref 43.0–77.0)
Platelets: 240 K/uL (ref 150.0–400.0)
RBC: 4.73 Mil/uL (ref 3.87–5.11)
RDW: 12.8 % (ref 11.5–15.5)
WBC: 4.6 K/uL (ref 4.0–10.5)

## 2024-01-07 LAB — HEPATIC FUNCTION PANEL
ALT: 19 U/L (ref 3–35)
AST: 16 U/L (ref 5–37)
Albumin: 4 g/dL (ref 3.5–5.2)
Alkaline Phosphatase: 53 U/L (ref 39–117)
Bilirubin, Direct: 0.1 mg/dL (ref 0.1–0.3)
Total Bilirubin: 0.7 mg/dL (ref 0.2–1.2)
Total Protein: 7 g/dL (ref 6.0–8.3)

## 2024-01-07 LAB — BASIC METABOLIC PANEL WITH GFR
BUN: 12 mg/dL (ref 6–23)
CO2: 31 meq/L (ref 19–32)
Calcium: 9.2 mg/dL (ref 8.4–10.5)
Chloride: 101 meq/L (ref 96–112)
Creatinine, Ser: 0.83 mg/dL (ref 0.40–1.20)
GFR: 71.85 mL/min
Glucose, Bld: 130 mg/dL — ABNORMAL HIGH (ref 70–99)
Potassium: 3.1 meq/L — ABNORMAL LOW (ref 3.5–5.1)
Sodium: 141 meq/L (ref 135–145)

## 2024-01-07 LAB — HEMOGLOBIN A1C: Hgb A1c MFr Bld: 5.9 % (ref 4.6–6.5)

## 2024-01-07 LAB — TSH: TSH: 2.12 u[IU]/mL (ref 0.35–5.50)

## 2024-01-09 ENCOUNTER — Other Ambulatory Visit

## 2024-01-13 ENCOUNTER — Telehealth: Payer: Self-pay

## 2024-01-13 NOTE — Telephone Encounter (Signed)
 Left a detail message on pt's phone on mobile, home phone number stating for pt call us  to reschedule physical appt as 915am is scheduled for acute visit. Apologize to pt for inconvenient. Will sent mychart message.

## 2024-01-14 ENCOUNTER — Encounter: Admitting: Internal Medicine

## 2024-01-18 ENCOUNTER — Other Ambulatory Visit: Payer: Self-pay | Admitting: Internal Medicine

## 2024-01-26 ENCOUNTER — Ambulatory Visit: Payer: Self-pay | Admitting: Internal Medicine

## 2024-01-26 NOTE — Progress Notes (Signed)
 Review at upcoming visit   blood sugar elevated  but not diabetic A1c .

## 2024-01-28 ENCOUNTER — Ambulatory Visit: Admitting: Internal Medicine

## 2024-01-28 VITALS — BP 108/58 | HR 61 | Temp 97.8°F | Ht 64.0 in | Wt 135.4 lb

## 2024-01-28 DIAGNOSIS — Z79899 Other long term (current) drug therapy: Secondary | ICD-10-CM

## 2024-01-28 DIAGNOSIS — H547 Unspecified visual loss: Secondary | ICD-10-CM

## 2024-01-28 DIAGNOSIS — I10 Essential (primary) hypertension: Secondary | ICD-10-CM

## 2024-01-28 DIAGNOSIS — Z1211 Encounter for screening for malignant neoplasm of colon: Secondary | ICD-10-CM

## 2024-01-28 DIAGNOSIS — G319 Degenerative disease of nervous system, unspecified: Secondary | ICD-10-CM | POA: Diagnosis not present

## 2024-01-28 DIAGNOSIS — Z0000 Encounter for general adult medical examination without abnormal findings: Secondary | ICD-10-CM

## 2024-01-28 DIAGNOSIS — E876 Hypokalemia: Secondary | ICD-10-CM

## 2024-01-28 DIAGNOSIS — F0280 Dementia in other diseases classified elsewhere without behavioral disturbance: Secondary | ICD-10-CM | POA: Diagnosis not present

## 2024-01-28 MED ORDER — DONEPEZIL HCL 10 MG PO TABS: 10.0000 mg | ORAL_TABLET | Freq: Every day | ORAL | 1 refills | Status: AC

## 2024-01-28 NOTE — Patient Instructions (Signed)
 Decrease the  losartan  hydrochlorothiazide to 1/2 pill per day     Add  high potassium foods.   Plan  lab  in 2-4 weeks ( dont have to fast )   bmp ang magnesion to make sure  potassium is coming up.  Send in bp readings after   1-2 weeks to be sure at goal .  Look into the  specifics of  vision rehab as we discussed  I can refill hte doanzepil for you  s

## 2024-01-28 NOTE — Progress Notes (Signed)
 "  Chief Complaint  Patient presents with   Annual Exam    Pt is here with daughter in law, Andrea. Advise to update pt's DPR before leaving office.     HPI: Patient  Stacey Young  69 y.o. comes in today for Preventive Health Care visit    with d in law where she is currently living since deaht of her husband in the past months . Spouse recenetly passed from metastatic  abd adeno uncertain source .  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  But they were told nothing to offer at last visit months ago  Taking aricept  uncertain if helps  She states basically she cannot read and interpret  what she is seeing .  No motor difficulties pain ex mtp discomfort  at times no gout . Is active  but will only go out with partnering  to avoid possibility of getting lost. Eats well and no gi dysfunction   Health Maintenance  Topic Date Due   Fecal DNA (Cologuard)  01/16/2024   Bone Density Scan  09/15/2024 (Originally 06/08/2019)   COVID-19 Vaccine (8 - Pfizer risk 2025-26 season) 04/30/2024   Medicare Annual Wellness (AWV)  09/15/2024   Mammogram  03/26/2025   DTaP/Tdap/Td (4 - Td or Tdap) 05/11/2031   Pneumococcal Vaccine: 50+ Years  Completed   Influenza Vaccine  Completed   Hepatitis C Screening  Completed   Zoster Vaccines- Shingrix  Completed   Meningococcal B Vaccine  Aged Out   Colonoscopy  Discontinued   Health Maintenance Review LIFESTYLE:  Exercise:  active as possible not limited or balance problem   Tobacco/ETS:n Alcohol:  Sugar beverages:n Sleep: well  Drug use: no HH of  4 and   Younger sons  house .   ROS:  GEN/ HEENT: No fever, significant weight changes sweats headaches vision problems hearing changes, CV/ PULM; No chest pain shortness of breath cough, syncope,edema  change in exercise tolerance. GI  /GU: No adominal pain, vomiting, change in bowel habits. No blood in the stool. No significant GU symptoms. SKIN/HEME: ,no acute skin rashes suspicious lesions or bleeding. No lymphadenopathy, nodules, masses.  NEURO/ PSYCH:  No weakness numbness.  IMM/ Allergy: No unusual infections.  Allergy .   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/2001   for fibroids and endometriosis    Family History  Problem Relation Age of Onset   Arthritis Mother    Obstructive Sleep Apnea Mother        on CPAP   Cancer Father  bladder   Hyperlipidemia Father    Migraines Sister    Hyperlipidemia Sister        low wbc   Dementia Other        older aunt uncle gm 59s and 43s   Breast cancer Neg Hx     Social History   Socioeconomic History   Marital status: Married    Spouse name: Not on file   Number of children: Not on file   Years of education: Not on file   Highest education level: Doctorate  Occupational History   Not on file  Tobacco Use   Smoking status: Never   Smokeless tobacco: Never  Vaping Use   Vaping status: Never Used  Substance and Sexual Activity   Alcohol use: Yes    Alcohol/week: 1.0 standard drink of alcohol    Types: 1 Glasses of wine per week   Drug use: No   Sexual activity: Not on file  Other Topics Concern   Not on file  Social History Narrative   Never Smoked   Alcohol use-yes about 2 per day red wine    Regular Exercise-yes   Drug use- no   hh of 2    Pet dog   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 about   orig from [place]    Childbirth Hx   Social Drivers of Health   Tobacco Use: Low Risk (09/16/2023)   Patient History    Smoking Tobacco Use: Never    Smokeless Tobacco Use: Never    Passive Exposure: Not on file  Financial Resource Strain: Low Risk (01/27/2024)   Overall Financial Resource Strain (CARDIA)    Difficulty of Paying Living Expenses: Not hard at all  Food Insecurity: No Food Insecurity (01/27/2024)   Epic    Worried About Programme Researcher, Broadcasting/film/video in the Last Year: Never true    Ran Out of Food in the Last Year: Never true  Transportation Needs: No Transportation Needs (01/27/2024)   Epic    Lack of Transportation (Medical): No    Lack of Transportation (Non-Medical): No  Physical Activity: Insufficiently Active (01/27/2024)   Exercise Vital Sign    Days of Exercise per Week: 3 days    Minutes of Exercise per Session: 40 min  Stress: No Stress Concern Present (01/27/2024)   [employer] of Occupational Health - Occupational Stress Questionnaire    Feeling of Stress: Only a little  Social Connections: Socially Isolated (01/27/2024)   Social Connection and Isolation Panel    Frequency of Communication with Friends and Family: More than three times a week    Frequency of Social Gatherings with Friends and Family: Once a week    Attends Religious Services: Never    Database Administrator or Organizations: No    Attends Engineer, Structural: Not on file    Marital Status: Widowed  Depression (PHQ2-9): Low Risk (01/28/2024)   Depression (PHQ2-9)    PHQ-2 Score: 3  Alcohol Screen: Low Risk (01/27/2024)   Alcohol Screen    Last Alcohol Screening Score (AUDIT): 4  Housing: Low Risk (01/27/2024)   Epic    Unable to Pay for Housing in the Last Year: No    Number of Times Moved in the Last  Year: 1    Homeless in the Last Year: No  Utilities: Not At Risk (04/26/2022)   AHC Utilities    Threatened  with loss of utilities: No  Health Literacy: Not on file    Outpatient Medications Prior to Visit  Medication Sig Dispense Refill   losartan -hydrochlorothiazide (HYZAAR) 100-25 MG tablet TAKE 1 TABLET BY MOUTH DAILY 90 tablet 1   vitamin B-12 (CYANOCOBALAMIN ) 100 MCG tablet Take 100 mcg by mouth daily.     donepezil  (ARICEPT ) 10 MG tablet TAKE 1 TABLET BY MOUTH EVERY DAY 90 tablet 0   No facility-administered medications prior to visit.     EXAM:  BP (!) 108/58 (BP Location: Left Arm, Patient Position: Sitting, Cuff Size: Normal)   Pulse 61   Temp 97.8 F (36.6 C) (Oral)   Ht 5' 4 (1.626 m)   Wt 135 lb 6.4 oz (61.4 kg)   SpO2 98%   BMI 23.24 kg/m   Body mass index is 23.24 kg/m. Wt Readings from Last 3 Encounters:  01/28/24 135 lb 6.4 oz (61.4 kg)  02/24/23 150 lb (68 kg)  01/09/23 154 lb 12.8 oz (70.2 kg)    Physical Exam: Vital signs reviewed HZW:Uypd is a well-developed well-nourished alert cooperative    who appearsr stated age in no acute distress.  HEENT: normocephalic atraumatic , Eyes: PERRL EOM's full, conjunctiva clear, Nares: paten,t no deformity discharge or tenderness., Ears: no deformity EAC's clear TMs with normal landmarks. Mouth: clear OP, no lesions, edema.  Moist mucous membranes. Dentition in adequate repair. NECK: supple without masses, thyromegaly or bruits. CHEST/PULM:  Clear to auscultation and percussion breath sounds equal no wheeze , rales or rhonchi. No chest wall deformities or tenderness. Breast: normal by inspection . No dimpling, discharge, masses, tenderness or discharge  CV: PMI is nondisplaced, S1 S2 no gallops, murmurs, rubs. Peripheral pulses are full without delay.No JVD .  ABDOMEN: Bowel sounds normal nontender  No guard or rebound, no hepato splenomegal no CVA tenderness.  No hernia. Extremtities:  No clubbing cyanosis or  edema, no acute joint swelling or redness no focal atrophy prominent mtp r but normal circ and exam of feet.  NEURO:  Oriented x3, cranial nerves 3-12 appear to be intact, no obvious focal weakness,gait within normal limits no abnormal reflexes or asymmetrical cognition note formally tested   see  last neuro check  nl speech and conversation SKIN: No acute rashes normal turgor, color, no bruising or petechiae. PSYCH: Oriented, good eye contact, no obvious depression anxiety, cognition and judgment appear normal. LN: no cervical axillary i adenopathy  Lab Results  Component Value Date   WBC 4.6 01/07/2024   HGB 14.6 01/07/2024   HCT 42.3 01/07/2024   PLT 240.0 01/07/2024   GLUCOSE 130 (H) 01/07/2024   CHOL 201 (H) 01/07/2024   TRIG 112.0 01/07/2024   HDL 69.00 01/07/2024   LDLCALC 110 (H) 01/07/2024   ALT 19 01/07/2024   AST 16 01/07/2024   NA 141 01/07/2024   K 3.1 (L) 01/07/2024   CL 101 01/07/2024   CREATININE 0.83 01/07/2024   BUN 12 01/07/2024   CO2 31 01/07/2024   TSH 2.12 01/07/2024   INR 1.0 04/26/2019   HGBA1C 5.9 01/07/2024    BP Readings from Last 3 Encounters:  01/28/24 (!) 108/58  02/24/23 120/80  01/09/23 106/66    Lab results reviewed with patient and d in low  ASSESSMENT AND PLAN:  Discussed the following assessment and plan:    ICD-10-CM   1. Visit for preventive health examination  Z00.00     2. Screening for colon cancer  Z12.11 Cologuard  3. Essential hypertension  I10 Basic metabolic panel with GFR    Magnesium    4. Medication management  Z79.899 Basic metabolic panel with GFR    Magnesium    5. 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 Hypokalemia  E87.6 Basic metabolic panel with GFR    Magnesium    Lower potassium    bp has been low and ok to try 1/2 dosing   inc potassium in diet and check bmp in a few weeks  If all ok then continue  Otherwise can add meds  orther to maintain  Would reach out to [facility] to see what  is offered  . Most likely adaptive help with her declining visual processing loss .  Return for lab in 2-4 weeks  bp readings and then fu 6 mos depending .  Patient Care Team: Tashauna Caisse K, MD as PCP - General Patient Instructions  Decrease the  losartan  hydrochlorothiazide to 1/2 pill per day     Add  high potassium foods.   Plan  lab  in 2-4 weeks ( dont have to fast )   bmp ang magnesion to make sure  potassium is coming up.  Send in bp readings after   1-2 weeks to be sure at goal .  Look into the  specifics of  vision rehab as we discussed  I can refill hte doanzepil for you  s  [facility] K. Elayne Gruver M.D.  "

## 2024-02-05 NOTE — Telephone Encounter (Signed)
 Yes  go  back up to full dose of  bp med   and monitor   Anxiety meds are usually nuanced to avoid  cognitive side effects benefit more than risk .   If on ongoing    concerns  issue I would probably  ask for another visit  A virtual  would be ok .   Meds used for  on going difficult sx are  meds such as sertraline   citalopram  daily ssri meds

## 2024-02-07 LAB — COLOGUARD: COLOGUARD: NEGATIVE

## 2024-02-11 ENCOUNTER — Other Ambulatory Visit

## 2024-02-11 ENCOUNTER — Ambulatory Visit: Payer: Self-pay | Admitting: Internal Medicine

## 2024-02-11 NOTE — Progress Notes (Signed)
 Colon cancer screen  negaive   please inform patient

## 2024-02-24 ENCOUNTER — Other Ambulatory Visit
